# Patient Record
Sex: Male | Born: 1985 | Race: White | Hispanic: No | Marital: Married | Smoking: Current every day smoker
Health system: Southern US, Community
[De-identification: ages and names within clinical notes are randomized; demographics above are authoritative.]

## PROBLEM LIST (undated history)

## (undated) DIAGNOSIS — G5601 Carpal tunnel syndrome, right upper limb: Secondary | ICD-10-CM

## (undated) DIAGNOSIS — I10 Essential (primary) hypertension: Secondary | ICD-10-CM

## (undated) HISTORY — PX: HAND SURGERY: SHX662

## (undated) HISTORY — DX: Essential (primary) hypertension: I10

---
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
## 2007-12-02 ENCOUNTER — Emergency Department (HOSPITAL_COMMUNITY): Admission: EM | Admit: 2007-12-02 | Discharge: 2007-12-02 | Payer: Self-pay | Admitting: Emergency Medicine

## 2007-12-10 ENCOUNTER — Emergency Department (HOSPITAL_COMMUNITY): Admission: EM | Admit: 2007-12-10 | Discharge: 2007-12-10 | Payer: Self-pay | Admitting: Neurosurgery

## 2011-01-31 LAB — RPR: RPR Ser Ql: NONREACTIVE

## 2011-01-31 LAB — HSV 2 ANTIBODY, IGG: HSV 2 Glycoprotein G Ab, IgG: 0.05

## 2011-03-21 ENCOUNTER — Emergency Department (INDEPENDENT_AMBULATORY_CARE_PROVIDER_SITE_OTHER)
Admission: EM | Admit: 2011-03-21 | Discharge: 2011-03-21 | Disposition: A | Discharge status: Home / Self Care | Payer: Self-pay | Source: Home / Self Care | Attending: Emergency Medicine | Admitting: Emergency Medicine

## 2011-03-21 ENCOUNTER — Encounter (HOSPITAL_COMMUNITY): Payer: Self-pay | Admitting: Emergency Medicine

## 2011-03-21 ENCOUNTER — Emergency Department (INDEPENDENT_AMBULATORY_CARE_PROVIDER_SITE_OTHER): Payer: Self-pay

## 2011-03-21 DIAGNOSIS — Z23 Encounter for immunization: Secondary | ICD-10-CM

## 2011-03-21 DIAGNOSIS — S60459A Superficial foreign body of unspecified finger, initial encounter: Secondary | ICD-10-CM

## 2011-03-21 DIAGNOSIS — S60454A Superficial foreign body of right ring finger, initial encounter: Secondary | ICD-10-CM

## 2011-03-21 MED ORDER — TETANUS-DIPHTH-ACELL PERTUSSIS 5-2.5-18.5 LF-MCG/0.5 IM SUSP
0.5000 mL | Freq: Once | INTRAMUSCULAR | Status: AC
  Administered 2011-03-21: 0.5 mL via INTRAMUSCULAR

## 2011-03-21 MED ORDER — IBUPROFEN 600 MG PO TABS: 600.0000 mg | ORAL_TABLET | Freq: Four times a day (QID) | ORAL | Status: AC | PRN

## 2011-03-21 MED ORDER — TETANUS-DIPHTH-ACELL PERTUSSIS 5-2.5-18.5 LF-MCG/0.5 IM SUSP
INTRAMUSCULAR | Status: AC
  Filled 2011-03-21: qty 0.5

## 2011-03-21 MED ORDER — LIDOCAINE HCL (PF) 1 % IJ SOLN: 5.0000 mL | Freq: Once | INTRAMUSCULAR | Status: DC

## 2011-03-21 NOTE — ED Provider Notes (Signed)
History     CSN: [id number] Arrival date & time: 03/21/2011 10:52 AM   First MD Initiated Contact with Patient 03/21/11 1007      Chief Complaint  Patient presents with  . Foreign Body in Skin    HPI Comments: Pt righthanded male states was finishing a floor yesterday and got a large splinter in his right ring finger. States was able to remove part of it but is concerned that there is still something in it. No parathesias, redness, fevers, swelling. Has not tried anything else for this. Tetanus unknown.   Patient is a 25 y.o. male presenting with hand pain.  Hand Pain This is a new problem. The current episode started yesterday. The problem has not changed since onset.The symptoms are aggravated by nothing. The symptoms are relieved by nothing. Treatments tried: partially removed splinter.    History reviewed. No pertinent past medical history.  History reviewed. No pertinent past surgical history.  History reviewed. No pertinent family history.  History  Substance Use Topics  . Smoking status: Current Everyday Smoker -- 1.0 packs/day  . Smokeless tobacco: Not on file  . Alcohol Use: No      Review of Systems  Constitutional: Negative for fever.  Gastrointestinal: Negative for nausea and vomiting.  Skin: Positive for wound. Negative for rash.  Neurological: Negative for numbness.  Hematological: Does not bruise/bleed easily.    Allergies  Review of patient's allergies indicates no known allergies.  Home Medications   Current Outpatient Rx  Name Route Sig Dispense Refill  . IBUPROFEN 600 MG PO TABS Oral Take 1 tablet (600 mg total) by mouth every 6 (six) hours as needed for pain. 30 tablet 0    BP 149/95  Pulse 70  Temp 98.6 F (37 C)  Resp 16  SpO2 100%  Physical Exam  Nursing note and vitals reviewed. Constitutional: He is oriented to person, place, and time. He appears well-developed and well-nourished.  HENT:  Head: Normocephalic and atraumatic.    Eyes: Conjunctivae and EOM are normal.  Neck: Normal range of motion.  Cardiovascular: Regular rhythm.   Pulmonary/Chest: Effort normal. No respiratory distress.  Abdominal: He exhibits no distension.  Musculoskeletal: Normal range of motion. He exhibits no edema and no tenderness.       Baseline Strength and Sensation to R hand with normal light touch intact for Pt, distal motor and sensation in median/radial/ulnar nerve distribution with CR< 2 secs and pulse intact.  Hand with intact motor strength 5/5 flexion / extension / FDP / FDS  against resistance and 2-point discrimination intact at 5mm in right ring finger. Large FB noted at lateral distal phalanx.Wrist WNL.    Neurological: He is alert and oriented to person, place, and time.  Skin: Skin is warm and dry.  Psychiatric: He has a normal mood and affect. His behavior is normal.    ED Course  FOREIGN BODY REMOVAL Date/Time: 03/21/2011 12:38 PM Performed by: Luiz Blare Authorized by: Luiz Blare Consent: Verbal consent obtained. Written consent not obtained. Risks and benefits: risks, benefits and alternatives were discussed Consent given by: patient Patient understanding: patient states understanding of the procedure being performed Patient consent: the patient's understanding of the procedure matches consent given Procedure consent: procedure consent matches procedure scheduled Relevant documents: relevant documents present and verified Test results: test results available and properly labeled Site marked: the operative site was marked Imaging studies: imaging studies available Patient identity confirmed: verbally with patient and arm band Intake:  right ring finger. Anesthesia: digital block Local anesthetic: lidocaine 2% without epinephrine Anesthetic total: 4 ml Patient sedated: no Patient restrained: no Patient cooperative: yes Complexity: simple 1 objects recovered. Objects recovered: large wooden  splinter Post-procedure assessment: foreign body removed Patient tolerance: Patient tolerated the procedure well with no immediate complications. Comments: Pt scrubbed hand with chlorhexadine scrub for several minutes prior to procedure. Site then cleansed with iodine. No residual FB seen. Irrigated applied bacitracin and sterile pressure dressing.     Labs Reviewed - No data to display Dg Finger Ring Right  03/21/2011  *RADIOLOGY REPORT*  Clinical Data: Possible wooden foreign body in the distal right ring finger.  RIGHT RING FINGER 2+V 03/21/2011:  Comparison: None.  Findings: Region of interest marked with metallic BB on the skin. No opaque foreign body in the soft tissues.  No osseous abnormality.  IMPRESSION: No opaque foreign bodies in the soft tissues.  No osseous abnormality.  (Please note that wood is not typically radio- opaque.)  Original Report Authenticated By: Arnell Sieving, M.D.     1. Superficial foreign body of right ring finger     MDM    Luiz Blare, MD 03/21/11 1240

## 2011-03-24 ENCOUNTER — Encounter (HOSPITAL_COMMUNITY): Payer: Self-pay

## 2011-03-24 NOTE — ED Notes (Signed)
Splinter in finger, attempted self care w/o success

## 2011-07-13 ENCOUNTER — Emergency Department (HOSPITAL_COMMUNITY)
Admission: EM | Admit: 2011-07-13 | Discharge: 2011-07-13 | Disposition: A | Discharge status: Home / Self Care | Payer: Self-pay | Attending: Emergency Medicine | Admitting: Emergency Medicine

## 2011-07-13 ENCOUNTER — Encounter (HOSPITAL_COMMUNITY): Payer: Self-pay | Admitting: Emergency Medicine

## 2011-07-13 DIAGNOSIS — L0231 Cutaneous abscess of buttock: Secondary | ICD-10-CM | POA: Insufficient documentation

## 2011-07-13 DIAGNOSIS — F17200 Nicotine dependence, unspecified, uncomplicated: Secondary | ICD-10-CM | POA: Insufficient documentation

## 2011-07-13 DIAGNOSIS — K612 Anorectal abscess: Secondary | ICD-10-CM

## 2011-07-13 MED ORDER — CLINDAMYCIN HCL 150 MG PO CAPS: 300.0000 mg | ORAL_CAPSULE | Freq: Three times a day (TID) | ORAL | Status: AC

## 2011-07-13 NOTE — Discharge Instructions (Signed)
Peri-Rectal Abscess Your caregiver has diagnosed you as having a peri-rectal abscess. This is an infected area near the rectum that is filled with pus. If the abscess is near the surface of the skin, your caregiver may open (incise) the area and drain the pus. HOME CARE INSTRUCTIONS   If your abscess was opened up and drained. A small piece of gauze may be placed in the opening so that it can drain. Do not remove the gauze unless directed by your caregiver.   A loose dressing may be placed over the abscess site. Change the dressing as often as necessary to keep it clean and dry.   After the drain is removed, the area may be washed with a gentle antiseptic (soap) four times per day.   A warm sitz bath, warm packs or heating pad may be used for pain relief, taking care not to burn yourself.   Return for a wound check in 1 day or as directed.   An "inflatable doughnut" may be used for sitting with added comfort. These can be purchased at a drugstore or medical supply house.   To reduce pain and straining with bowel movements, eat a high fiber diet with plenty of fruits and vegetables. Use stool softeners as recommended by your caregiver. This is especially important if narcotic type pain medications were prescribed as these may cause marked constipation.   Only take over-the-counter or prescription medicines for pain, discomfort, or fever as directed by your caregiver.  SEEK IMMEDIATE MEDICAL CARE IF:   You have increasing pain that is not controlled by medication.   There is increased inflammation (redness), swelling, bleeding, or drainage from the area.   An oral temperature above 102 F (38.9 C) develops.   You develop chills or generalized malaise (feel lethargic or feel "washed out").   You develop any new symptoms (problems) you feel may be related to your present problem.  Document Released: 04/18/2000 Document Revised: 04/10/2011 Document Reviewed: 04/18/2008 [employer] Patient  Information 2012 [employer]. 

## 2011-07-13 NOTE — ED Provider Notes (Signed)
History     CSN: [id number]  Arrival date & time 07/13/11  1505   First MD Initiated Contact with Patient 07/13/11 1651      Chief Complaint  Patient presents with  . Abscess    (Consider location/radiation/quality/duration/timing/severity/associated sxs/prior treatment) HPI Patient presents with discomfort about a gluteal fold lesion.  He notes that the lesion has been there for approximately one year.  The size of the lesion has waxed and waned, as has the discomfort. He denies previous physician evaluation or any attempts at incision.  He denies any new fevers, chills, diarrhea, abdominal pain, other focal complaints.  He notes that he presented today because he is tired of the lesion.   History reviewed. No pertinent past medical history.  History reviewed. No pertinent past surgical history.  No family history on file.  History  Substance Use Topics  . Smoking status: Current Everyday Smoker -- 1.0 packs/day  . Smokeless tobacco: Not on file  . Alcohol Use: Yes      Review of Systems  All other systems reviewed and are negative.    Allergies  Review of patient's allergies indicates no known allergies.  Home Medications   Current Outpatient Rx  Name Route Sig Dispense Refill  . CLINDAMYCIN HCL 150 MG PO CAPS Oral Take 2 capsules (300 mg total) by mouth 3 (three) times daily. 21 capsule 0    BP 148/92  Pulse 90  Temp(Src) 97.7 F (36.5 C) (Oral)  Resp 20  SpO2 100%  Physical Exam  Nursing note and vitals reviewed. Constitutional: He is oriented to person, place, and time. He appears well-developed. No distress.  HENT:  Head: Normocephalic and atraumatic.  Eyes: Conjunctivae and EOM are normal.  Cardiovascular: Normal rate and regular rhythm.   Pulmonary/Chest: Effort normal. No stridor. No respiratory distress.  Abdominal: He exhibits no distension.  Genitourinary:     Musculoskeletal: He exhibits no edema.  Neurological: He is alert and oriented  to person, place, and time.  Skin: Skin is warm and dry.  Psychiatric: He has a normal mood and affect.    ED Course  Procedures (including critical care time)  Labs Reviewed - No data to display No results found.   1. Abscess of anal and rectal regions       MDM  This generally well-appearing male now presents with a lesion that has been present for one year.  On exam the patient is in no distress.  There is a lesion in the perirectal region which is consistent with abscess.  The patient defers my offer for incision and drainage today.  He prefers an attempt at oral antibiotics with warm compresses, sitz baths.  He was provided referral to one of our surgical colleagues if he is not improving in the next days.        Gerhard Munch, MD 07/13/11 1726

## 2011-07-13 NOTE — ED Notes (Signed)
C/o abscess on R buttocks x 1 year.  States it goes away and comes back.

## 2015-05-06 HISTORY — PX: MANDIBLE SURGERY: SHX707

## 2015-08-11 ENCOUNTER — Emergency Department (HOSPITAL_COMMUNITY)
Admission: EM | Admit: 2015-08-11 | Discharge: 2015-08-11 | Disposition: A | Discharge status: Other Acute Inpatient Hospital | Payer: Self-pay | Attending: Emergency Medicine | Admitting: Emergency Medicine

## 2015-08-11 ENCOUNTER — Emergency Department (HOSPITAL_COMMUNITY): Payer: Self-pay

## 2015-08-11 ENCOUNTER — Encounter (HOSPITAL_COMMUNITY): Payer: Self-pay | Admitting: Emergency Medicine

## 2015-08-11 DIAGNOSIS — Y9389 Activity, other specified: Secondary | ICD-10-CM | POA: Insufficient documentation

## 2015-08-11 DIAGNOSIS — S02609A Fracture of mandible, unspecified, initial encounter for closed fracture: Secondary | ICD-10-CM

## 2015-08-11 DIAGNOSIS — K002 Abnormalities of size and form of teeth: Secondary | ICD-10-CM | POA: Insufficient documentation

## 2015-08-11 DIAGNOSIS — Y998 Other external cause status: Secondary | ICD-10-CM | POA: Insufficient documentation

## 2015-08-11 DIAGNOSIS — S02642A Fracture of ramus of left mandible, initial encounter for closed fracture: Secondary | ICD-10-CM | POA: Insufficient documentation

## 2015-08-11 DIAGNOSIS — Y9289 Other specified places as the place of occurrence of the external cause: Secondary | ICD-10-CM | POA: Insufficient documentation

## 2015-08-11 DIAGNOSIS — F17200 Nicotine dependence, unspecified, uncomplicated: Secondary | ICD-10-CM | POA: Insufficient documentation

## 2015-08-11 DIAGNOSIS — R000 Tachycardia, unspecified: Secondary | ICD-10-CM | POA: Insufficient documentation

## 2015-08-11 LAB — BASIC METABOLIC PANEL
Anion gap: 14 (ref 5–15)
CALCIUM: 9 mg/dL (ref 8.9–10.3)
CHLORIDE: 106 mmol/L (ref 101–111)
CO2: 23 mmol/L (ref 22–32)
CREATININE: 0.98 mg/dL (ref 0.61–1.24)
Glucose, Bld: 92 mg/dL (ref 65–99)
Potassium: 4.2 mmol/L (ref 3.5–5.1)
SODIUM: 143 mmol/L (ref 135–145)

## 2015-08-11 LAB — CBC WITH DIFFERENTIAL/PLATELET
BASOS PCT: 0 %
Basophils Absolute: 0 10*3/uL (ref 0.0–0.1)
EOS ABS: 0.1 10*3/uL (ref 0.0–0.7)
EOS PCT: 1 %
HCT: 49.2 % (ref 39.0–52.0)
HEMOGLOBIN: 17.1 g/dL — AB (ref 13.0–17.0)
Lymphocytes Relative: 27 %
Lymphs Abs: 2.6 10*3/uL (ref 0.7–4.0)
MCH: 32.3 pg (ref 26.0–34.0)
MCHC: 34.8 g/dL (ref 30.0–36.0)
MCV: 92.8 fL (ref 78.0–100.0)
MONOS PCT: 9 %
Monocytes Absolute: 0.9 10*3/uL (ref 0.1–1.0)
NEUTROS PCT: 63 %
Neutro Abs: 6.3 10*3/uL (ref 1.7–7.7)
PLATELETS: 250 10*3/uL (ref 150–400)
RBC: 5.3 MIL/uL (ref 4.22–5.81)
RDW: 13.4 % (ref 11.5–15.5)
WBC: 9.9 10*3/uL (ref 4.0–10.5)

## 2015-08-11 LAB — ETHANOL: ALCOHOL ETHYL (B): 254 mg/dL — AB (ref <5)

## 2015-08-11 MED ORDER — NICOTINE 21 MG/24HR TD PT24
21.0000 mg | MEDICATED_PATCH | Freq: Once | TRANSDERMAL | Status: DC
  Administered 2015-08-11: 21 mg via TRANSDERMAL
  Filled 2015-08-11: qty 1

## 2015-08-11 MED ORDER — SODIUM CHLORIDE 0.9 % IV BOLUS (SEPSIS)
1000.0000 mL | Freq: Once | INTRAVENOUS | Status: AC
  Administered 2015-08-11: 1000 mL via INTRAVENOUS

## 2015-08-11 MED ORDER — MORPHINE SULFATE (PF) 4 MG/ML IV SOLN
4.0000 mg | Freq: Once | INTRAVENOUS | Status: AC
  Administered 2015-08-11: 4 mg via INTRAVENOUS
  Filled 2015-08-11: qty 1

## 2015-08-11 MED ORDER — CEFAZOLIN SODIUM 1-5 GM-% IV SOLN
1.0000 g | Freq: Once | INTRAVENOUS | Status: AC
  Administered 2015-08-11: 1 g via INTRAVENOUS
  Filled 2015-08-11: qty 50

## 2015-08-11 MED ORDER — HYDROMORPHONE HCL 1 MG/ML IJ SOLN
1.0000 mg | Freq: Once | INTRAMUSCULAR | Status: AC
  Administered 2015-08-11: 1 mg via INTRAVENOUS
  Filled 2015-08-11: qty 1
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
## 2016-07-02 ENCOUNTER — Emergency Department (HOSPITAL_COMMUNITY)
Admission: EM | Admit: 2016-07-02 | Discharge: 2016-07-02 | Disposition: A | Discharge status: Home / Self Care | Payer: Self-pay | Attending: Emergency Medicine | Admitting: Emergency Medicine

## 2016-07-02 ENCOUNTER — Encounter (HOSPITAL_COMMUNITY): Payer: Self-pay | Admitting: Emergency Medicine

## 2016-07-02 DIAGNOSIS — F17200 Nicotine dependence, unspecified, uncomplicated: Secondary | ICD-10-CM | POA: Insufficient documentation

## 2016-07-02 DIAGNOSIS — L0501 Pilonidal cyst with abscess: Secondary | ICD-10-CM | POA: Insufficient documentation

## 2016-07-02 NOTE — ED Provider Notes (Signed)
[facility] Provider Note   CSN: [id number] Arrival date & time: 07/02/16  1550  By signing my name below, I, Teofilo PodMatthew P. Jamison, attest that this documentation has been prepared under the direction and in the presence of Mathews RobinsonsJessica Shenay Torti, [place]PA-C. Electronically Signed: Teofilo PodMatthew P. Jamison, ED Scribe. 07/02/2016. 5:00 PM.    History   Chief Complaint Chief Complaint  Patient presents with  . Abscess   The history is provided by the patient. No language interpreter was used.   HPI Comments:  Warren Ross is a 31 y.o. male who presents to the Emergency Department complaining of a worsening abscess to his lower back x 2 weeks. Pt reports drainage that began 1 week ago, and the drainage has remained persistent since. Pt had been trying to drain the abscess manually but the drainage is still continuing. He states that the abscess is not painful at this time, but states that he has difficulty finding a comfortable sitting position. Pt states that he is otherwise healthy. Pt states that he had a previously prescribed Z-Pak at home that he took yesterday with no relief. Pt denies fever, chills.   History reviewed. No pertinent past medical history.  There are no active problems to display for this patient.   Past Surgical History:  Procedure Laterality Date  . HAND SURGERY Left        Home Medications    Prior to Admission medications   Not on File    Family History History reviewed. No pertinent family history.  Social History Social History  Substance Use Topics  . Smoking status: Current Every Day Smoker    Packs/day: 1.00  . Smokeless tobacco: Not on file  . Alcohol use 58.8 oz/week    98 Cans of beer per week     Comment: "close to 24 beers a day"     Allergies   Patient has no known allergies.   Review of Systems Review of Systems  Constitutional: Negative for chills and fever.  HENT: Negative for ear pain and sore throat.   Eyes: Negative for  pain and visual disturbance.  Respiratory: Negative for cough and shortness of breath.   Cardiovascular: Negative for chest pain and palpitations.  Gastrointestinal: Negative for abdominal pain and vomiting.  Genitourinary: Negative for dysuria and hematuria.  Musculoskeletal: Negative for arthralgias and back pain.  Skin: Positive for wound. Negative for color change and rash.  Neurological: Negative for seizures and syncope.  All other systems reviewed and are negative.    Physical Exam Updated Vital Signs BP 140/97 (BP Location: Left Arm)   Pulse 98   Temp 97.9 F (36.6 C) (Oral)   Resp 19   Ht 6' (1.829 m)   Wt 79.4 kg   SpO2 100%   BMI 23.73 kg/m   Physical Exam  Constitutional: He appears well-developed and well-nourished. No distress.  Patient is afebrile, non-toxic appearing, seating comfortably in chair in no acute distress.   HENT:  Head: Normocephalic and atraumatic.  Eyes: Conjunctivae are normal.  Cardiovascular: Normal rate, regular rhythm and normal heart sounds.   Pulmonary/Chest: Effort normal and breath sounds normal. No respiratory distress. He has no wheezes. He has no rales. He exhibits no tenderness.  Abdominal: He exhibits no distension.  Neurological: He is alert.  Skin: Skin is warm and dry. He is not diaphoretic. No pallor.  Erythematous raised area 3.5 cm long by 1cm wide at the sacral region. No spreading or erythema or evidence of cellulitis.  Non-tender to the touch.   Psychiatric: He has a normal mood and affect.  Nursing note and vitals reviewed.    ED Treatments / Results  DIAGNOSTIC STUDIES:  Oxygen Saturation is 100% on RA, normal by my interpretation.    COORDINATION OF CARE:  4:57 PM Will order [place] and perform I&D. Discussed treatment plan with pt at bedside and pt agreed to plan.   Labs (all labs ordered are listed, but only abnormal results are displayed) Labs Reviewed - No data to display  EKG  EKG Interpretation None        Radiology No results found.  Procedures Procedures (including critical care time)  EMERGENCY DEPARTMENT US SOFT TISSUE INTERPRETATION "Study: Limited Soft Tissue Ultrasound"  INDICATIONS: Soft tissue infection Multiple views of the body part were obtained in real-time with a multi-frequency linear probe  PERFORMED BY: Myself IMAGES ARCHIVED?: No SIDE:Midline BODY PART:Lower back INTERPRETATION:  No abcess noted     Medications Ordered in ED Medications - No data to display   Initial Impression / Assessment and Plan / ED Course   I have reviewed the triage vital signs and the nursing notes.  Pertinent labs & imaging results that were available during my care of the patient were reviewed by me and considered in my medical decision making (see chart for details).       Patient presents with pilonidal abscess that is not amenable to I&D. [place] without evidence of drainable abscess. No ulcers Non-tender to the touch. Exam otherwise unremarkable.  Discharge home with close follow up with surgery.Patient was advised to use sitz baths.  Patient was discussed with Dr. Jeraldine Loots who also has seen patient and agrees with assessment and plan.  Discussed strict return precautions. Patient was advised to return to the emergency department if experiencing any new or worsening symptoms. Patient clearly understood instructions and agreed with discharge plan. He  Final Clinical Impressions(s) / ED Diagnoses   Final diagnoses:  Pilonidal abscess    New Prescriptions There are no discharge medications for this patient. I personally performed the services described in this documentation, which was scribed in my presence. The recorded information has been reviewed and is accurate.     Georgiana Shore, PA-C 07/02/16 1841    Gerhard Munch, MD 07/02/16 2040

## 2016-07-02 NOTE — ED Triage Notes (Signed)
Pt with abscess to lower back with some draining x 2 weeks

## 2016-08-01 ENCOUNTER — Ambulatory Visit (HOSPITAL_COMMUNITY)
Admission: EM | Admit: 2016-08-01 | Discharge: 2016-08-01 | Disposition: A | Discharge status: Home / Self Care | Payer: Self-pay | Attending: Internal Medicine | Admitting: Internal Medicine

## 2016-08-01 ENCOUNTER — Encounter (HOSPITAL_COMMUNITY): Payer: Self-pay | Admitting: Emergency Medicine

## 2016-08-01 DIAGNOSIS — Z720 Tobacco use: Secondary | ICD-10-CM

## 2016-08-01 DIAGNOSIS — I10 Essential (primary) hypertension: Secondary | ICD-10-CM

## 2016-08-01 MED ORDER — AMLODIPINE BESYLATE 10 MG PO TABS: 10.0000 mg | ORAL_TABLET | Freq: Every day | ORAL | 2 refills | Status: DC

## 2016-08-01 NOTE — ED Provider Notes (Signed)
[facility]    CSN: [id number] Arrival date & time: 08/01/16  1005     History   Chief Complaint Chief Complaint  Patient presents with  . Hypertension    HPI Warren Ross is a 31 y.o. male.   Pt presents to clinic due to BP 170/105 on pre-work physical.  Smokes 1.5 pk/day.  Denies chest pain, SOB, N/V or diaphoresis.        History reviewed. No pertinent past medical history.  There are no active problems to display for this patient.   Past Surgical History:  Procedure Laterality Date  . HAND SURGERY Left        Home Medications    Prior to Admission medications   Medication Sig Start Date End Date Taking? Authorizing Provider  amLODipine (NORVASC) 10 MG tablet Take 1 tablet (10 mg total) by mouth daily. 08/01/16   Arnaldo Natal, MD    Family History History reviewed. No pertinent family history.  Social History Social History  Substance Use Topics  . Smoking status: Current Every Day Smoker    Packs/day: 1.00  . Smokeless tobacco: Not on file  . Alcohol use 58.8 oz/week    98 Cans of beer per week     Comment: "close to 24 beers a day"     Allergies   Patient has no known allergies.   Review of Systems Review of Systems  Constitutional: Negative for chills and fever.  HENT: Negative for sore throat and tinnitus.   Eyes: Negative for redness.  Respiratory: Negative for cough and shortness of breath.   Cardiovascular: Negative for chest pain and palpitations.  Gastrointestinal: Negative for abdominal pain, diarrhea, nausea and vomiting.  Genitourinary: Negative for dysuria, frequency and urgency.  Musculoskeletal: Negative for myalgias.  Skin: Negative for rash.       No lesions  Neurological: Negative for weakness.  Hematological: Does not bruise/bleed easily.  Psychiatric/Behavioral: Negative for suicidal ideas.     Physical Exam Triage Vital Signs ED Triage Vitals  Enc Vitals Group     BP 08/01/16 1029 (!)  157/111     Pulse Rate 08/01/16 1029 88     Resp 08/01/16 1029 16     Temp 08/01/16 1029 98 F (36.7 C)     Temp Source 08/01/16 1029 Oral     SpO2 08/01/16 1029 100 %     Weight --      Height --      Head Circumference --      Peak Flow --      Pain Score 08/01/16 1030 0     Pain Loc --      Pain Edu? --      Excl. in GC? --    No data found.   Updated Vital Signs BP (!) 157/111 (BP Location: Left Arm) Comment: reported elevated BP to [facility]  Pulse 88   Temp 98 F (36.7 C) (Oral)   Resp 16   SpO2 100%   Visual Acuity Right Eye Distance:   Left Eye Distance:   Bilateral Distance:    Right Eye Near:   Left Eye Near:    Bilateral Near:     Physical Exam  Constitutional: He is oriented to person, place, and time. He appears well-developed and well-nourished. No distress.  HENT:  Head: Normocephalic and atraumatic.  Mouth/Throat: Oropharynx is clear and moist.  Eyes: Conjunctivae and EOM are normal. Pupils are equal, round, and reactive to light.  No scleral icterus.  Neck: Normal range of motion. Neck supple. No JVD present. No tracheal deviation present. No thyromegaly present.  Cardiovascular: Normal rate, regular rhythm and normal heart sounds.  Exam reveals no gallop and no friction rub.   No murmur heard. Pulmonary/Chest: Effort normal and breath sounds normal. No respiratory distress.  Abdominal: Soft. Bowel sounds are normal. He exhibits no distension. There is no tenderness.  Musculoskeletal: Normal range of motion. He exhibits no edema.  Lymphadenopathy:    He has no cervical adenopathy.  Neurological: He is alert and oriented to person, place, and time. No cranial nerve deficit.  Skin: Skin is warm and dry. No rash noted. No erythema.  Psychiatric: He has a normal mood and affect. His behavior is normal. Judgment and thought content normal.     UC Treatments / Results  Labs (all labs ordered are listed, but only abnormal results are  displayed) Labs Reviewed - No data to display  EKG  EKG Interpretation None       Radiology No results found.  Procedures Procedures (including critical care time)  Medications Ordered in UC Medications - No data to display   Initial Impression / Assessment and Plan / UC Course  I have reviewed the triage vital signs and the nursing notes.  Pertinent labs & imaging results that were available during my care of the patient were reviewed by me and considered in my medical decision making (see chart for details).     BP readings x2 confirm hypertension.  Stage II HTN would usually require 2 agents.  Due to unsure access to follow up at this location pt and I have agreed to trial of amlodipine due to low risk side effect profile. Encourage to quit smoking and to establish care with PMD (which will be possible once pt gets health insurance thru job he is currently applying for).  Final Clinical Impressions(s) / UC Diagnoses   Final diagnoses:  Essential hypertension  Tobacco abuse    New Prescriptions Discharge Medication List as of 08/01/2016 11:46 AM    START taking these medications   Details  amLODipine (NORVASC) 10 MG tablet Take 1 tablet (10 mg total) by mouth daily., Starting Fri 08/01/2016, Normal         Arnaldo Natal, MD 08/01/16 [phone]

## 2016-08-01 NOTE — ED Triage Notes (Signed)
The patient was sent to the [facility] for hypertension from a job interview.

## 2017-01-16 ENCOUNTER — Ambulatory Visit (INDEPENDENT_AMBULATORY_CARE_PROVIDER_SITE_OTHER): Payer: BLUE CROSS/BLUE SHIELD | Admitting: Family Medicine

## 2017-01-16 ENCOUNTER — Encounter: Payer: Self-pay | Admitting: Family Medicine

## 2017-01-16 VITALS — BP 140/100 | HR 98 | Temp 98.1°F | Resp 14 | Ht 72.5 in | Wt 184.8 lb

## 2017-01-16 DIAGNOSIS — Z720 Tobacco use: Secondary | ICD-10-CM

## 2017-01-16 DIAGNOSIS — I10 Essential (primary) hypertension: Secondary | ICD-10-CM

## 2017-01-16 DIAGNOSIS — F1090 Alcohol use, unspecified, uncomplicated: Secondary | ICD-10-CM | POA: Insufficient documentation

## 2017-01-16 DIAGNOSIS — F1020 Alcohol dependence, uncomplicated: Secondary | ICD-10-CM

## 2017-01-16 HISTORY — DX: Essential (primary) hypertension: I10

## 2017-01-16 HISTORY — DX: Alcohol use, unspecified, uncomplicated: F10.90

## 2017-01-16 HISTORY — DX: Tobacco use: Z72.0

## 2017-01-16 MED ORDER — AMLODIPINE BESYLATE 10 MG PO TABS: 10.0000 mg | ORAL_TABLET | Freq: Every day | ORAL | 1 refills | Status: DC

## 2017-01-16 NOTE — Patient Instructions (Signed)
F/U 6 weeks for blood pressure - fasting at that visit
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
## 2017-01-16 NOTE — Assessment & Plan Note (Signed)
dISCUSSED tobacco and alcohol use. He is not ready to quit either. We discussed cutting back at least. States he's tried nicotine patches but it didn't work. Discussed SHEENT exam Wellbutrin but he is not one he used either one. We'll continue to work with him about his tobacco and alcohol.

## 2017-01-16 NOTE — Assessment & Plan Note (Signed)
Restart norvasc  , check CMET, CBC\ Return for BP check and fasting lipid

## 2017-01-17 LAB — COMPREHENSIVE METABOLIC PANEL
AG Ratio: 2 (calc) (ref 1.0–2.5)
ALBUMIN MSPROF: 4.4 g/dL (ref 3.6–5.1)
ALT: 15 U/L (ref 9–46)
AST: 18 U/L (ref 10–40)
Alkaline phosphatase (APISO): 74 U/L (ref 40–115)
BUN/Creatinine Ratio: 5 (calc) — ABNORMAL LOW (ref 6–22)
BUN: 5 mg/dL — AB (ref 7–25)
CO2: 28 mmol/L (ref 20–32)
CREATININE: 1.03 mg/dL (ref 0.60–1.35)
Calcium: 9.3 mg/dL (ref 8.6–10.3)
Chloride: 104 mmol/L (ref 98–110)
Globulin: 2.2 g/dL (calc) (ref 1.9–3.7)
Glucose, Bld: 74 mg/dL (ref 65–99)
POTASSIUM: 4.3 mmol/L (ref 3.5–5.3)
SODIUM: 138 mmol/L (ref 135–146)
TOTAL PROTEIN: 6.6 g/dL (ref 6.1–8.1)
Total Bilirubin: 0.5 mg/dL (ref 0.2–1.2)

## 2017-01-17 LAB — CBC WITH DIFFERENTIAL/PLATELET
BASOS ABS: 60 {cells}/uL (ref 0–200)
Basophils Relative: 1 %
EOS ABS: 180 {cells}/uL (ref 15–500)
Eosinophils Relative: 3 %
HCT: 48.5 % (ref 38.5–50.0)
Hemoglobin: 16.8 g/dL (ref 13.2–17.1)
Lymphs Abs: 2166 cells/uL (ref 850–3900)
MCH: 32.1 pg (ref 27.0–33.0)
MCHC: 34.6 g/dL (ref 32.0–36.0)
MCV: 92.6 fL (ref 80.0–100.0)
MONOS PCT: 12 %
MPV: 10.3 fL (ref 7.5–12.5)
NEUTROS PCT: 47.9 %
Neutro Abs: 2874 cells/uL (ref 1500–7800)
Platelets: 262 10*3/uL (ref 140–400)
RBC: 5.24 10*6/uL (ref 4.20–5.80)
RDW: 12.2 % (ref 11.0–15.0)
Total Lymphocyte: 36.1 %
WBC mixed population: 720 cells/uL (ref 200–950)
WBC: 6 10*3/uL (ref 3.8–10.8)

## 2017-01-20 ENCOUNTER — Encounter: Payer: Self-pay | Admitting: *Deleted

## 2017-03-02 ENCOUNTER — Encounter: Payer: Self-pay | Admitting: Family Medicine

## 2017-03-02 ENCOUNTER — Ambulatory Visit (INDEPENDENT_AMBULATORY_CARE_PROVIDER_SITE_OTHER): Payer: BLUE CROSS/BLUE SHIELD | Admitting: Family Medicine

## 2017-03-02 VITALS — BP 132/94 | HR 84 | Temp 98.1°F | Resp 16 | Ht 72.5 in | Wt 187.0 lb

## 2017-03-02 DIAGNOSIS — I10 Essential (primary) hypertension: Secondary | ICD-10-CM

## 2017-03-02 DIAGNOSIS — Z13220 Encounter for screening for lipoid disorders: Secondary | ICD-10-CM | POA: Diagnosis not present

## 2017-03-02 LAB — BASIC METABOLIC PANEL
BUN: 8 mg/dL (ref 7–25)
CHLORIDE: 104 mmol/L (ref 98–110)
CO2: 26 mmol/L (ref 20–32)
CREATININE: 0.91 mg/dL (ref 0.60–1.35)
Calcium: 9.9 mg/dL (ref 8.6–10.3)
GLUCOSE: 88 mg/dL (ref 65–99)
Potassium: 5.2 mmol/L (ref 3.5–5.3)
SODIUM: 138 mmol/L (ref 135–146)

## 2017-03-02 LAB — LIPID PANEL
CHOL/HDL RATIO: 2.6 (calc) (ref <5.0)
Cholesterol: 189 mg/dL (ref <200)
HDL: 72 mg/dL (ref >40)
LDL Cholesterol (Calc): 102 mg/dL (calc) — ABNORMAL HIGH
NON-HDL CHOLESTEROL (CALC): 117 mg/dL (ref <130)
TRIGLYCERIDES: 66 mg/dL (ref <150)

## 2017-03-02 MED ORDER — HYDROCHLOROTHIAZIDE 12.5 MG PO TABS: 12.5000 mg | ORAL_TABLET | Freq: Every day | ORAL | 2 refills | Status: DC

## 2017-03-02 MED ORDER — AMLODIPINE BESYLATE 10 MG PO TABS: 10.0000 mg | ORAL_TABLET | Freq: Every day | ORAL | 1 refills | Status: DC

## 2017-03-02 NOTE — Patient Instructions (Addendum)
F/U 3  months  New medication HCTZ take with the amlodipine  We will call with results

## 2017-03-02 NOTE — Assessment & Plan Note (Signed)
Blood pressure improved but still uncontrolled.  Will add hydrochlorothiazide 12.5 mg in addition to his amlodipine 10 mg.  His cholesterol level will be checked today.

## 2017-03-02 NOTE — Progress Notes (Signed)
   Subjective:    Patient ID: Warren Ross, male    DOB: 1985/05/26, 31 y.o.   MRN: [id number]  Patient presents for Follow-up (is fasting)  Pt here to f/u  He was started on norvasc 10 mg for blood pressure in Sept, he had been on before after BP was elevated at work screening.  Due for fasting lipid and BP recheck today  No side effects from the amlodipine states that he feels well.  Review Of Systems:  GEN- denies fatigue, fever, weight loss,weakness, recent illness HEENT- denies eye drainage, change in vision, nasal discharge, CVS- denies chest pain, palpitations RESP- denies SOB, cough, wheeze ABD- denies N/V, change in stools, abd pain GU- denies dysuria, hematuria, dribbling, incontinence MSK- denies joint pain, muscle aches, injury Neuro- denies headache, dizziness, syncope, seizure activity       Objective:    BP (!) 132/94   Pulse 84   Temp 98.1 F (36.7 C) (Oral)   Resp 16   Ht 6' 0.5" (1.842 m)   Wt 187 lb (84.8 kg)   SpO2 98%   BMI 25.01 kg/m  GEN- NAD, alert and oriented x3 HEENT- PERRL, EOMI, non injected sclera, pink conjunctiva, MMM, oropharynx clear CVS- RRR, no murmur RESP-CTAB EXT- No edema Pulses- Radial,2+        Assessment & Plan:      Problem List Items Addressed This Visit      Unprioritized   Screening cholesterol level   Relevant Orders   Lipid panel   Essential hypertension - Primary    Blood pressure improved but still uncontrolled.  Will add hydrochlorothiazide 12.5 mg in addition to his amlodipine 10 mg.  His cholesterol level will be checked today.      Relevant Medications   hydrochlorothiazide (HYDRODIURIL) 12.5 MG tablet   amLODipine (NORVASC) 10 MG tablet   Other Relevant Orders   Basic metabolic panel      Note: This dictation was prepared with Dragon dictation along with smaller phrase technology. Any transcriptional errors that result from this process are unintentional.

## 2017-03-05 ENCOUNTER — Encounter: Payer: Self-pay | Admitting: *Deleted

## 2017-06-02 ENCOUNTER — Ambulatory Visit (INDEPENDENT_AMBULATORY_CARE_PROVIDER_SITE_OTHER): Payer: BLUE CROSS/BLUE SHIELD | Admitting: Family Medicine

## 2017-06-02 ENCOUNTER — Encounter: Payer: Self-pay | Admitting: Family Medicine

## 2017-06-02 ENCOUNTER — Other Ambulatory Visit: Payer: Self-pay

## 2017-06-02 VITALS — BP 126/78 | HR 82 | Temp 98.1°F | Resp 16 | Ht 72.5 in | Wt 189.0 lb

## 2017-06-02 DIAGNOSIS — Z720 Tobacco use: Secondary | ICD-10-CM

## 2017-06-02 DIAGNOSIS — I10 Essential (primary) hypertension: Secondary | ICD-10-CM

## 2017-06-02 MED ORDER — HYDROCHLOROTHIAZIDE 12.5 MG PO TABS: 12.5000 mg | ORAL_TABLET | Freq: Every day | ORAL | 2 refills | Status: DC

## 2017-06-02 MED ORDER — AMLODIPINE BESYLATE 10 MG PO TABS: 10.0000 mg | ORAL_TABLET | Freq: Every day | ORAL | 2 refills | Status: DC

## 2017-06-02 NOTE — Progress Notes (Signed)
   Subjective:    Patient ID: Warren Ross, male    DOB: 1985-09-29, 32 y.o.   MRN: [id number]  Patient presents for Follow-up (is fasting)  Pt here to f/u chronic medical problems. HTN- last visit in Oct added HCTZ 12.5mg  to his norvasc 10mg   Normal cholesterol and renal function in October     No concerns today  He has stopped drinking    Review Of Systems:  GEN- denies fatigue, fever, weight loss,weakness, recent illness HEENT- denies eye drainage, change in vision, nasal discharge, CVS- denies chest pain, palpitations RESP- denies SOB, cough, wheeze ABD- denies N/V, change in stools, abd pain GU- denies dysuria, hematuria, dribbling, incontinence MSK- denies joint pain, muscle aches, injury Neuro- denies headache, dizziness, syncope, seizure activity       Objective:    BP 126/78   Pulse 82   Temp 98.1 F (36.7 C) (Oral)   Resp 16   Ht 6' 0.5" (1.842 m)   Wt 189 lb (85.7 kg)   SpO2 98%   BMI 25.28 kg/m  GEN- NAD, alert and oriented x3 HEENT- PERRL, EOMI, non injected sclera, pink conjunctiva, MMM, oropharynx clear Neck- Supple, no thyromegaly CVS- RRR, no murmur RESP-CTAB ABD-NABS,soft,NT,ND EXT- No edema Pulses- Radial 2+        Assessment & Plan:      Problem List Items Addressed This Visit      Unprioritized   Tobacco use    He is not ready to quit yet, since he stopped drinking      Essential hypertension - Primary    Well controlled no change in medication Recent Labs look good F/U 6 months for Physical      Relevant Medications   amLODipine (NORVASC) 10 MG tablet   hydrochlorothiazide (HYDRODIURIL) 12.5 MG tablet      Note: This dictation was prepared with Dragon dictation along with smaller phrase technology. Any transcriptional errors that result from this process are unintentional.

## 2017-06-02 NOTE — Assessment & Plan Note (Signed)
He is not ready to quit yet, since he stopped drinking

## 2017-06-02 NOTE — Patient Instructions (Signed)
F/U 6 months for PHYSICAL  

## 2017-06-02 NOTE — Assessment & Plan Note (Signed)
Well controlled no change in medication Recent Labs look good F/U 6 months for Physical

## 2017-07-14 ENCOUNTER — Encounter: Payer: Self-pay | Admitting: Family Medicine

## 2017-07-14 ENCOUNTER — Ambulatory Visit (INDEPENDENT_AMBULATORY_CARE_PROVIDER_SITE_OTHER): Payer: BLUE CROSS/BLUE SHIELD | Admitting: Family Medicine

## 2017-07-14 ENCOUNTER — Other Ambulatory Visit: Payer: Self-pay

## 2017-07-14 VITALS — BP 132/70 | HR 82 | Temp 98.9°F | Resp 16 | Ht 72.5 in | Wt 190.0 lb

## 2017-07-14 DIAGNOSIS — J0100 Acute maxillary sinusitis, unspecified: Secondary | ICD-10-CM

## 2017-07-14 DIAGNOSIS — B349 Viral infection, unspecified: Secondary | ICD-10-CM

## 2017-07-14 DIAGNOSIS — R6889 Other general symptoms and signs: Secondary | ICD-10-CM | POA: Diagnosis not present

## 2017-07-14 LAB — INFLUENZA A AND B AG, IMMUNOASSAY
INFLUENZA A ANTIGEN: NOT DETECTED
INFLUENZA B ANTIGEN: NOT DETECTED

## 2017-07-14 MED ORDER — GUAIFENESIN-CODEINE 100-10 MG/5ML PO SOLN: 5.0000 mL | Freq: Four times a day (QID) | ORAL | 0 refills | Status: DC | PRN

## 2017-07-14 MED ORDER — AMOXICILLIN 875 MG PO TABS
875.0000 mg | ORAL_TABLET | Freq: Two times a day (BID) | ORAL | 0 refills | Status: DC
Start: 2017-07-14 — End: 2018-05-25

## 2017-07-14 NOTE — Progress Notes (Signed)
   Subjective:    Patient ID: Warren Ross, male    DOB: 15-Feb-1986, 32 y.o.   MRN: [id number]  Patient presents for Illness (x4 days- fever, body aches, nasal congestion, productive cough with thick white mucus, sinus pressure, tooth pain)   Started last Thursday, cough with congestion and production, nasal drainage, sinus pressure, right sided facial , tooth pain Took OTC cough medicine    Review Of Systems:  GEN- denies fatigue, fever, weight loss,weakness, recent illness HEENT- denies eye drainage, change in vision, nasal discharge, CVS- denies chest pain, palpitations RESP- denies SOB, cough, wheeze ABD- denies N/V, change in stools, abd pain GU- denies dysuria, hematuria, dribbling, incontinence MSK- denies joint pain, muscle aches, injury Neuro- denies headache, dizziness, syncope, seizure activity       Objective:    BP 132/70   Pulse 82   Temp 98.9 F (37.2 C) (Oral)   Resp 16   Ht 6' 0.5" (1.842 m)   Wt 190 lb (86.2 kg)   SpO2 96%   BMI 25.41 kg/m   GEN- NAD, alert and oriented x3 HEENT- PERRL, EOMI, non injected sclera, pink conjunctiva, MMM, oropharynx mild injection, TM clear bilat no effusion,  + Right maxillary sinus tenderness, inflammed turbinates,  Nasal drainage  Neck- Supple, shotty submandibular LAD CVS- RRR, no murmur RESP-CTAB EXT- No edema Pulses- Radial 2+    Flu neg      Assessment & Plan:      Problem List Items Addressed This Visit    None    Visit Diagnoses    Viral illness    -  Primary   Relevant Orders   Influenza A and B Ag, Immunoassay (Completed)   Acute maxillary sinusitis, recurrence not specified       also add anti-histamine   Relevant Medications   amoxicillin (AMOXIL) 875 MG tablet   guaiFENesin-codeine 100-10 MG/5ML syrup      Note: This dictation was prepared with Dragon dictation along with smaller phrase technology. Any transcriptional errors that result from this process are unintentional.

## 2017-07-14 NOTE — Patient Instructions (Signed)
F/U as previous  Take antibiotics  Use claritin/zyrtec  Take cough medicine

## 2017-07-15 ENCOUNTER — Encounter: Payer: Self-pay | Admitting: Family Medicine
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
## 2017-12-01 ENCOUNTER — Encounter: Payer: BLUE CROSS/BLUE SHIELD | Admitting: Family Medicine

## 2018-05-25 ENCOUNTER — Other Ambulatory Visit: Payer: Self-pay

## 2018-05-25 ENCOUNTER — Encounter: Payer: Self-pay | Admitting: Family Medicine

## 2018-05-25 ENCOUNTER — Ambulatory Visit (INDEPENDENT_AMBULATORY_CARE_PROVIDER_SITE_OTHER): Payer: 59 | Admitting: Family Medicine

## 2018-05-25 VITALS — BP 130/68 | HR 98 | Temp 98.4°F | Resp 14 | Ht 77.0 in | Wt 201.0 lb

## 2018-05-25 DIAGNOSIS — Z720 Tobacco use: Secondary | ICD-10-CM | POA: Diagnosis not present

## 2018-05-25 DIAGNOSIS — I10 Essential (primary) hypertension: Secondary | ICD-10-CM | POA: Diagnosis not present

## 2018-05-25 DIAGNOSIS — M7022 Olecranon bursitis, left elbow: Secondary | ICD-10-CM

## 2018-05-25 DIAGNOSIS — Z0000 Encounter for general adult medical examination without abnormal findings: Secondary | ICD-10-CM | POA: Diagnosis not present

## 2018-05-25 LAB — COMPREHENSIVE METABOLIC PANEL
AG RATIO: 1.7 (calc) (ref 1.0–2.5)
ALT: 20 U/L (ref 9–46)
AST: 16 U/L (ref 10–40)
Albumin: 4.5 g/dL (ref 3.6–5.1)
Alkaline phosphatase (APISO): 81 U/L (ref 40–115)
BUN: 9 mg/dL (ref 7–25)
CO2: 28 mmol/L (ref 20–32)
CREATININE: 1.01 mg/dL (ref 0.60–1.35)
Calcium: 9.8 mg/dL (ref 8.6–10.3)
Chloride: 100 mmol/L (ref 98–110)
GLUCOSE: 101 mg/dL — AB (ref 65–99)
Globulin: 2.6 g/dL (calc) (ref 1.9–3.7)
Potassium: 3.9 mmol/L (ref 3.5–5.3)
SODIUM: 137 mmol/L (ref 135–146)
TOTAL PROTEIN: 7.1 g/dL (ref 6.1–8.1)
Total Bilirubin: 0.6 mg/dL (ref 0.2–1.2)

## 2018-05-25 LAB — CBC WITH DIFFERENTIAL/PLATELET
Absolute Monocytes: 704 cells/uL (ref 200–950)
BASOS ABS: 74 {cells}/uL (ref 0–200)
Basophils Relative: 1.1 %
Eosinophils Absolute: 121 cells/uL (ref 15–500)
Eosinophils Relative: 1.8 %
HCT: 47.9 % (ref 38.5–50.0)
HEMOGLOBIN: 16.4 g/dL (ref 13.2–17.1)
Lymphs Abs: 2251 cells/uL (ref 850–3900)
MCH: 30.1 pg (ref 27.0–33.0)
MCHC: 34.2 g/dL (ref 32.0–36.0)
MCV: 88.1 fL (ref 80.0–100.0)
MONOS PCT: 10.5 %
MPV: 10.4 fL (ref 7.5–12.5)
NEUTROS ABS: 3551 {cells}/uL (ref 1500–7800)
Neutrophils Relative %: 53 %
Platelets: 317 10*3/uL (ref 140–400)
RBC: 5.44 10*6/uL (ref 4.20–5.80)
RDW: 12.5 % (ref 11.0–15.0)
Total Lymphocyte: 33.6 %
WBC: 6.7 10*3/uL (ref 3.8–10.8)

## 2018-05-25 LAB — LIPID PANEL
Cholesterol: 178 mg/dL (ref <200)
HDL: 45 mg/dL (ref >40)
LDL CHOLESTEROL (CALC): 117 mg/dL — AB
NON-HDL CHOLESTEROL (CALC): 133 mg/dL — AB (ref <130)
TRIGLYCERIDES: 67 mg/dL (ref <150)
Total CHOL/HDL Ratio: 4 (calc) (ref <5.0)

## 2018-05-25 MED ORDER — HYDROCHLOROTHIAZIDE 12.5 MG PO TABS: 12.5000 mg | ORAL_TABLET | Freq: Every day | ORAL | 2 refills | Status: DC

## 2018-05-25 MED ORDER — AMLODIPINE BESYLATE 10 MG PO TABS: 10.0000 mg | ORAL_TABLET | Freq: Every day | ORAL | 2 refills | Status: DC

## 2018-05-25 NOTE — Patient Instructions (Signed)
F/U 6 months

## 2018-05-25 NOTE — Assessment & Plan Note (Addendum)
Continues to smoke 1.5ppd Discussed cutting back , trying to get to 1ppd first He will think about it, but not interested in quitting at this time

## 2018-05-25 NOTE — Assessment & Plan Note (Signed)
Well controlled, he has picked up some weight since he is no longer drinking but appears healthier He is also staying active

## 2018-05-25 NOTE — Progress Notes (Signed)
   Subjective:    Patient ID: Warren Ross, male    DOB: [birth year], 33 y.o.   MRN: [id number]  Patient presents for Annual Exam (is fasting)   Here for complete physical exam.  Patient is reviewed he is taking his hydrochlorothiazide and amlodipine for his blood pressure. Family history reviewed  TDAP UTD Due for Flu shot - declines    He has he fluid knot on left elbow present for months, but is going down, it doesn't hurt.  Smokes 1.5ppd   Review Of Systems:  GEN- denies fatigue, fever, weight loss,weakness, recent illness HEENT- denies eye drainage, change in vision, nasal discharge, CVS- denies chest pain, palpitations RESP- denies SOB, cough, wheeze ABD- denies N/V, change in stools, abd pain GU- denies dysuria, hematuria, dribbling, incontinence MSK- denies joint pain, muscle aches, injury Neuro- denies headache, dizziness, syncope, seizure activity       Objective:    BP 130/68   Pulse 98   Temp 98.4 F (36.9 C) (Oral)   Resp 14   Ht 6\' 5"  (1.956 m)   Wt 201 lb (91.2 kg)   SpO2 100%   BMI 23.84 kg/m  GEN- NAD, alert and oriented x3 HEENT- PERRL, EOMI, non injected sclera, pink conjunctiva, MMM, oropharynx clear, TM clear bilat, no effusion , nares clear  Neck- Supple, no thyromegaly CVS- RRR, no murmur RESP-CTAB ABD-NABS,soft,NT,ND Psych- normal affect and mood  EXT- No edema, left elbow olecranon swelling,NT, no induration, no erythema Pulses- Radial, DP- 2+   Fall/depression/audit C screening NEGATIVE     Assessment & Plan:      Problem List Items Addressed This Visit      Unprioritized   Essential hypertension    Well controlled, he has picked up some weight since he is no longer drinking but appears healthier He is also staying active      Relevant Orders   CBC with Differential/Platelet   Comprehensive metabolic panel   Lipid panel   Tobacco use    Continues to smoke 1.5ppd Discussed cutting back , trying to get to 1ppd  first He will think about it, but not interested in quitting at this time       Other Visit Diagnoses    Routine general medical examination at a health care facility    -  Primary   CPE done, history updated, declines flu shot,he will set up dental visit   Relevant Orders   CBC with Differential/Platelet   Comprehensive metabolic panel   Lipid panel   Olecranon bursitis of left elbow       it is going down on its own, his previous job he was climbing under houses, no pain or redness, we discussing draining he wants to hold off      Note: This dictation was prepared with Dragon dictation along with smaller phrase technology. Any transcriptional errors that result from this process are unintentional.

## 2018-05-27 ENCOUNTER — Encounter: Payer: Self-pay | Admitting: *Deleted

## 2018-11-23 ENCOUNTER — Ambulatory Visit: Payer: 59 | Admitting: Family Medicine

## 2018-12-02 ENCOUNTER — Other Ambulatory Visit: Payer: Self-pay

## 2018-12-03 ENCOUNTER — Ambulatory Visit (INDEPENDENT_AMBULATORY_CARE_PROVIDER_SITE_OTHER): Payer: 59 | Admitting: Family Medicine

## 2018-12-03 ENCOUNTER — Encounter: Payer: Self-pay | Admitting: Family Medicine

## 2018-12-03 VITALS — BP 136/84 | HR 90 | Temp 97.9°F | Resp 14 | Ht 77.0 in | Wt 196.0 lb

## 2018-12-03 DIAGNOSIS — E785 Hyperlipidemia, unspecified: Secondary | ICD-10-CM | POA: Insufficient documentation

## 2018-12-03 DIAGNOSIS — G5601 Carpal tunnel syndrome, right upper limb: Secondary | ICD-10-CM

## 2018-12-03 DIAGNOSIS — F411 Generalized anxiety disorder: Secondary | ICD-10-CM | POA: Diagnosis not present

## 2018-12-03 DIAGNOSIS — Z720 Tobacco use: Secondary | ICD-10-CM

## 2018-12-03 DIAGNOSIS — I10 Essential (primary) hypertension: Secondary | ICD-10-CM

## 2018-12-03 DIAGNOSIS — E782 Mixed hyperlipidemia: Secondary | ICD-10-CM

## 2018-12-03 HISTORY — DX: Hyperlipidemia, unspecified: E78.5

## 2018-12-03 MED ORDER — HYDROCHLOROTHIAZIDE 12.5 MG PO TABS: 12.5000 mg | ORAL_TABLET | Freq: Every day | ORAL | 2 refills | Status: DC

## 2018-12-03 MED ORDER — BUPROPION HCL ER (SR) 150 MG PO TB12: 150.0000 mg | ORAL_TABLET | Freq: Two times a day (BID) | ORAL | 3 refills | Status: DC

## 2018-12-03 MED ORDER — AMLODIPINE BESYLATE 10 MG PO TABS: 10.0000 mg | ORAL_TABLET | Freq: Every day | ORAL | 2 refills | Status: DC

## 2018-12-03 NOTE — Progress Notes (Signed)
Subjective:    Patient ID: Warren Ross, male    DOB: [birth year], 33 y.o.   MRN: [id number]  Patient presents for Follow-up (is fasting)  Right hand has been "going to sleep: Usually from his forearm down to his fingertips.  This is been going on for quite some time.  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  Often feels like he needs to shake them out.  It will happen when he is driving also if he sleeps on his hands.  He has not had any injury to his neck no neck pain.  Hypertension he is taking his blood pressure medicine without any difficulty no side effects from the medication.  At increased anxiety has had panic attacks.  States that he has been very stressed his wife has decided to leave.  They have a young child between them.  She also has a new boyfriend this is all happened in the past 5 months.  At times he does not sleep very well but he just feels on edge.  He is also been smoking more now up to 3 packs a day some days.    Review Of Systems:  GEN- denies fatigue, fever, weight loss,weakness, recent illness HEENT- denies eye drainage, change in vision, nasal discharge, CVS- denies chest pain, palpitations RESP- denies SOB, cough, wheeze ABD- denies N/V, change in stools, abd pain GU- denies dysuria, hematuria, dribbling, incontinence MSK- denies joint pain, muscle aches, injury Neuro- denies headache, dizziness, syncope, seizure activity       Objective:    BP 136/84   Pulse 90   Temp 97.9 F (36.6 C) (Oral)   Resp 14   Ht 6\' 5"  (1.956 m)   Wt 196 lb (88.9 kg)   SpO2 96%   BMI 23.24 kg/m  GEN- NAD, alert and oriented x3 HEENT- PERRL, EOMI, non injected sclera, pink conjunctiva, MMM, oropharynx clear Neck- Supple, no thyromegaly CVS- RRR, no murmur RESP-CTAB Psych- normal affect and mood NEURO-CNII-XII in tact, no focal deficits, normal monofilament, neg tinels and phalens EXT- No edema Pulses- Radial 2+  PHQ 9 score 3, ETOH none      Assessment & Plan:      Problem List Items Addressed This Visit      Unprioritized   Essential hypertension - Primary    Pressures well controlled.  We will check his labs today for metabolic panel and CBC.      Relevant Medications   amLODipine (NORVASC) 10 MG tablet   hydrochlorothiazide (HYDRODIURIL) 12.5 MG tablet   Other Relevant Orders   CBC with Differential/Platelet   Comprehensive metabolic panel   GAD (generalized anxiety disorder)   Relevant Medications   buPROPion (WELLBUTRIN SR) 150 MG 12 hr tablet   Hyperlipidemia   Relevant Medications   amLODipine (NORVASC) 10 MG tablet   hydrochlorothiazide (HYDRODIURIL) 12.5 MG tablet   Other Relevant Orders   Lipid panel   Tobacco use    Counseled on tobacco cessation and the effects.  He has had increased stress in the setting of his wife separating from him and now having to figure out what they have been to do with her daughter.  He has been smoking more because of this.  We will start him on Wellbutrin to hopefully help both the stressors as well as his tobacco habit.  We will follow-up in 1 month for his medications.       Other Visit Diagnoses    Carpal  tunnel syndrome of right wrist       Nerve conduction study to be done he has classic symptoms of carpal tunnel   Relevant Medications   buPROPion (WELLBUTRIN SR) 150 MG 12 hr tablet   Other Relevant Orders   Nerve conduction test   Ambulatory referral to Neurology      Note: This dictation was prepared with Dragon dictation along with smaller phrase technology. Any transcriptional errors that result from this process are unintentional.

## 2018-12-03 NOTE — Assessment & Plan Note (Signed)
Counseled on tobacco cessation and the effects.  He has had increased stress in the setting of his wife separating from him and now having to figure out what they have been to do with her daughter.  He has been smoking more because of this.  We will start him on Wellbutrin to hopefully help both the stressors as well as his tobacco habit.  We will follow-up in 1 month for his medications.

## 2018-12-03 NOTE — Assessment & Plan Note (Signed)
Pressures well controlled.  We will check his labs today for metabolic panel and CBC.

## 2018-12-03 NOTE — Patient Instructions (Signed)
Referral for nerve conduction study  We will call with lab results F/U 1 months for medications

## 2018-12-04 LAB — COMPREHENSIVE METABOLIC PANEL
AG Ratio: 2 (calc) (ref 1.0–2.5)
ALT: 25 U/L (ref 9–46)
AST: 27 U/L (ref 10–40)
Albumin: 4.6 g/dL (ref 3.6–5.1)
Alkaline phosphatase (APISO): 79 U/L (ref 36–130)
BUN/Creatinine Ratio: 7 (calc) (ref 6–22)
BUN: 6 mg/dL — ABNORMAL LOW (ref 7–25)
CO2: 27 mmol/L (ref 20–32)
Calcium: 9.4 mg/dL (ref 8.6–10.3)
Chloride: 101 mmol/L (ref 98–110)
Creat: 0.89 mg/dL (ref 0.60–1.35)
Globulin: 2.3 g/dL (calc) (ref 1.9–3.7)
Glucose, Bld: 84 mg/dL (ref 65–99)
Potassium: 3.8 mmol/L (ref 3.5–5.3)
Sodium: 139 mmol/L (ref 135–146)
Total Bilirubin: 0.4 mg/dL (ref 0.2–1.2)
Total Protein: 6.9 g/dL (ref 6.1–8.1)

## 2018-12-04 LAB — LIPID PANEL
Cholesterol: 155 mg/dL (ref <200)
HDL: 62 mg/dL (ref >=40)
LDL Cholesterol (Calc): 74 mg/dL (calc)
Non-HDL Cholesterol (Calc): 93 mg/dL (calc) (ref <130)
Total CHOL/HDL Ratio: 2.5 (calc) (ref <5.0)
Triglycerides: 107 mg/dL (ref <150)

## 2018-12-04 LAB — CBC WITH DIFFERENTIAL/PLATELET
Absolute Monocytes: 567 cells/uL (ref 200–950)
Basophils Absolute: 61 cells/uL (ref 0–200)
Basophils Relative: 1 %
Eosinophils Absolute: 220 cells/uL (ref 15–500)
Eosinophils Relative: 3.6 %
HCT: 50.6 % — ABNORMAL HIGH (ref 38.5–50.0)
Hemoglobin: 17.9 g/dL — ABNORMAL HIGH (ref 13.2–17.1)
Lymphs Abs: 2489 cells/uL (ref 850–3900)
MCH: 32.3 pg (ref 27.0–33.0)
MCHC: 35.4 g/dL (ref 32.0–36.0)
MCV: 91.2 fL (ref 80.0–100.0)
MPV: 10 fL (ref 7.5–12.5)
Monocytes Relative: 9.3 %
Neutro Abs: 2763 cells/uL (ref 1500–7800)
Neutrophils Relative %: 45.3 %
Platelets: 283 10*3/uL (ref 140–400)
RBC: 5.55 10*6/uL (ref 4.20–5.80)
RDW: 13 % (ref 11.0–15.0)
Total Lymphocyte: 40.8 %
WBC: 6.1 10*3/uL (ref 3.8–10.8)

## 2018-12-25 ENCOUNTER — Other Ambulatory Visit: Payer: Self-pay | Admitting: Family Medicine

## 2019-01-04 ENCOUNTER — Other Ambulatory Visit: Payer: Self-pay

## 2019-01-04 ENCOUNTER — Encounter: Payer: Self-pay | Admitting: Family Medicine

## 2019-01-04 ENCOUNTER — Ambulatory Visit (INDEPENDENT_AMBULATORY_CARE_PROVIDER_SITE_OTHER): Payer: 59 | Admitting: Family Medicine

## 2019-01-04 VITALS — BP 128/88 | HR 88 | Temp 97.9°F | Resp 16 | Wt 191.6 lb

## 2019-01-04 DIAGNOSIS — S76011A Strain of muscle, fascia and tendon of right hip, initial encounter: Secondary | ICD-10-CM | POA: Diagnosis not present

## 2019-01-04 DIAGNOSIS — F411 Generalized anxiety disorder: Secondary | ICD-10-CM

## 2019-01-04 DIAGNOSIS — Z720 Tobacco use: Secondary | ICD-10-CM | POA: Diagnosis not present

## 2019-01-04 NOTE — Assessment & Plan Note (Signed)
Declines further meds at this time Wants to work on tobacco on his own Will call if he changes his mind   For hip, can use NSAIDS aleve or ibuprofen BID see if this improves, more strain

## 2019-01-04 NOTE — Patient Instructions (Signed)
Take aleve twice a day with food  Continue blood pressure medications F/U End of Jan for Physical

## 2019-01-04 NOTE — Progress Notes (Signed)
   Subjective:    Patient ID: Warren Ross, male    DOB: 1985/05/09, 33 y.o.   MRN: [id number]  Patient presents for Follow-up (anxiety, medications)  Patient here to follow-up medications for his stress and tobacco use.  Her last visit he mentioned that he was very stressed out and very anxious as he is separated from his wife and they have young children between them.  He been smoking more.  We started him on Wellbutrin 150mg .   Today he stays stopped the wellbutrin made him feel funny.   Now down to 1.5ppd a day    Has not had any panic attacks since the last visit   Decided    Sleeping good, states he is working long hours      Drinking more fluids , often doesn't eat as much as he works outside, weight down 5lbs which is common for him during the summer    He is also scheduled to see the neurologist for his carpal tunnel syndrome at the end of the month.   Noticed he was limping a little when he came in, states his cousin was horse playing and jumped on his back and side. Feels like he pulled something, no change in bowel or bladder, pain on side of leg when he gets it Still able to work  Review Of Systems:  GEN- denies fatigue, fever, weight loss,weakness, recent illness HEENT- denies eye drainage, change in vision, nasal discharge, CVS- denies chest pain, palpitations RESP- denies SOB, cough, wheeze ABD- denies N/V, change in stools, abd pain GU- denies dysuria, hematuria, dribbling, incontinence MSK- +joint pain, muscle aches, injury Neuro- denies headache, dizziness, syncope, seizure activity       Objective:    BP 128/88   Pulse 88   Temp 97.9 F (36.6 C)   Resp 16   Wt 191 lb 9.6 oz (86.9 kg)   BMI 22.72 kg/m  GEN- NAD, alert and oriented x3 CVS- RRR, no murmur RESP-CTAB Psych- normal affect and mood  MSK - Spine NT, good ROM spine, neg SLR, pain with IR of right hip, no pain with ER, strength equal bilat LE, antalgic gait  Neuro-normal tone LE  GAD  7score 2 PHQ 9 score 0      Assessment & Plan:      Problem List Items Addressed This Visit      Unprioritized   GAD (generalized anxiety disorder) - Primary    Declines further meds at this time Wants to work on tobacco on his own Will call if he changes his mind   For hip, can use NSAIDS aleve or ibuprofen BID see if this improves, more strain      Tobacco use    Other Visit Diagnoses    Strain of right hip, initial encounter          Note: This dictation was prepared with Dragon dictation along with smaller phrase technology. Any transcriptional errors that result from this process are unintentional.

## 2019-01-26 ENCOUNTER — Ambulatory Visit (INDEPENDENT_AMBULATORY_CARE_PROVIDER_SITE_OTHER): Payer: 59 | Admitting: Neurology

## 2019-01-26 ENCOUNTER — Other Ambulatory Visit: Payer: Self-pay

## 2019-01-26 ENCOUNTER — Encounter: Payer: Self-pay | Admitting: Neurology

## 2019-01-26 DIAGNOSIS — G5602 Carpal tunnel syndrome, left upper limb: Secondary | ICD-10-CM

## 2019-01-26 DIAGNOSIS — G5601 Carpal tunnel syndrome, right upper limb: Secondary | ICD-10-CM | POA: Diagnosis not present

## 2019-01-26 HISTORY — DX: Carpal tunnel syndrome, right upper limb: G56.01

## 2019-01-26 HISTORY — DX: Carpal tunnel syndrome, left upper limb: G56.02

## 2019-01-26 NOTE — Progress Notes (Signed)
Please refer to EMG and nerve conduction procedure note.  

## 2019-01-26 NOTE — Procedures (Signed)
     HISTORY:  Warren Ross is a 33 year old gentleman with a history of bilateral hand numbness that has been present for a number of months, it is worse on the right hand than the left.  He denies any significant neck pain or pain down the arms on either side.  He is being evaluated for possible neuropathy or a cervical radiculopathy.  NERVE CONDUCTION STUDIES:  Nerve conduction studies were performed on both upper extremities.  The distal motor latency of the right median nerve was prolonged with a normal motor amplitude.  The distal motor latency and motor amplitude for the left median nerve was normal.  The distal motor latencies and motor amplitudes for the ulnar nerves were normal bilaterally with normal nerve conduction velocities for the median and ulnar nerves bilaterally.  The sensory latency for the right median nerve was prolonged, normal for the left median nerve and for the ulnar nerves bilaterally.  The F-wave latencies for the ulnar nerves were normal bilaterally.  EMG STUDIES:  EMG study was performed on the left upper extremity:  The first dorsal interosseous muscle reveals 2 to 4 K units with full recruitment. No fibrillations or positive waves were noted. The abductor pollicis brevis muscle reveals 2 to 4 K units with full recruitment. No fibrillations or positive waves were noted. The extensor indicis proprius muscle reveals 1 to 3 K units with full recruitment. No fibrillations or positive waves were noted. The pronator teres muscle reveals 2 to 3 K units with full recruitment. No fibrillations or positive waves were noted. The biceps muscle reveals 1 to 2 K units with full recruitment. No fibrillations or positive waves were noted. The triceps muscle reveals 2 to 4 K units with full recruitment. No fibrillations or positive waves were noted. The anterior deltoid muscle reveals 2 to 3 K units with full recruitment. No fibrillations or positive waves were noted. The  cervical paraspinal muscles were tested at 2 levels. No abnormalities of insertional activity were seen at either level tested. There was poor relaxation.   IMPRESSION:  Nerve conduction studies done on both upper extremities shows evidence of mild right carpal tunnel syndrome.  EMG evaluation of the left upper extremity was unremarkable, no evidence of an overlying cervical radiculopathy was seen.  Jill Alexanders MD 01/26/2019 4:07 PM  [facility] [address] [place], [place] [address]  Phone [phone] Fax [phone]

## 2019-01-26 NOTE — Progress Notes (Addendum)
Call pt Nerve conduction shows mild carpal tunnel  I dont think it is severe enough for surgery but he may benefit from use of brace to wrist and/or injections  He can be referred to hand surgery for the injections if he wants Wrist brace script written    [place]    Nerve / Sites Muscle Latency Ref. Amplitude Ref. Rel Amp Segments Distance Velocity Ref. Area    ms ms mV mV %  cm m/s m/s mVms  R Median - APB     Wrist APB 4.4 ?4.4 11.7 ?4.0 100 Wrist - APB 7   45.2     Upper arm APB 9.1  11.7  99.9 Upper arm - Wrist 24 51 ?49 46.1  L Median - APB     Wrist APB 3.6 ?4.4 11.2 ?4.0 100 Wrist - APB 7   43.6     Upper arm APB 8.2  10.9  97.3 Upper arm - Wrist 24 53 ?49 41.9  R Ulnar - ADM     Wrist ADM 2.5 ?3.3 15.3 ?6.0 100 Wrist - ADM 7   48.4     B.Elbow ADM 5.9  14.8  96.7 B.Elbow - Wrist 21 62 ?49 46.1     A.Elbow ADM 7.7  14.5  97.9 A.Elbow - B.Elbow 10 55 ?49 46.9         A.Elbow - Wrist      L Ulnar - ADM     Wrist ADM 2.7 ?3.3 14.8 ?6.0 100 Wrist - ADM 7   47.4     B.Elbow ADM 6.5  14.4  97.3 B.Elbow - Wrist 22 58 ?49 48.3     A.Elbow ADM 8.2  13.9  96.4 A.Elbow - B.Elbow 10 58 ?49 45.8         A.Elbow - Wrist                 SNC    Nerve / Sites Rec. Site Peak Lat Ref.  Amp Ref. Segments Distance    ms ms V V  cm  R Median - Orthodromic (Dig II, Mid palm)     Dig II Wrist 4.4 ?3.4 5 ?10 Dig II - Wrist 13  L Median - Orthodromic (Dig II, Mid palm)     Dig II Wrist 3.3 ?3.4 11 ?10 Dig II - Wrist 13  R Ulnar - Orthodromic, (Dig V, Mid palm)     Dig V Wrist 2.7 ?3.1 12 ?5 Dig V - Wrist 11  L Ulnar - Orthodromic, (Dig V, Mid palm)     Dig V Wrist 2.7 ?3.1 16 ?5 Dig V - Wrist 36              F  Wave    Nerve F Lat Ref.   ms ms  R Ulnar - ADM 28.1 ?32.0  L Ulnar - ADM 28.8 ?32.0

## 2019-01-27 NOTE — Progress Notes (Signed)
Call placed to patient and patient made aware.   States that he will try the cock up splint prior to trying the injections. Will call back if he wants referral.

## 2019-06-01 ENCOUNTER — Encounter: Payer: 59 | Admitting: Family Medicine

## 2019-06-21 ENCOUNTER — Other Ambulatory Visit: Payer: Self-pay

## 2019-06-21 ENCOUNTER — Emergency Department (HOSPITAL_COMMUNITY): Payer: 59

## 2019-06-21 ENCOUNTER — Encounter (HOSPITAL_COMMUNITY): Payer: Self-pay

## 2019-06-21 ENCOUNTER — Inpatient Hospital Stay (HOSPITAL_COMMUNITY)
Admission: EM | Admit: 2019-06-21 | Discharge: 2019-06-23 | DRG: 872 | Disposition: A | Discharge status: Home / Self Care | Payer: 59 | Attending: Internal Medicine | Admitting: Internal Medicine

## 2019-06-21 DIAGNOSIS — F1090 Alcohol use, unspecified, uncomplicated: Secondary | ICD-10-CM

## 2019-06-21 DIAGNOSIS — L0390 Cellulitis, unspecified: Secondary | ICD-10-CM | POA: Diagnosis not present

## 2019-06-21 DIAGNOSIS — Z833 Family history of diabetes mellitus: Secondary | ICD-10-CM | POA: Diagnosis not present

## 2019-06-21 DIAGNOSIS — Z789 Other specified health status: Secondary | ICD-10-CM

## 2019-06-21 DIAGNOSIS — I10 Essential (primary) hypertension: Secondary | ICD-10-CM | POA: Diagnosis present

## 2019-06-21 DIAGNOSIS — F1011 Alcohol abuse, in remission: Secondary | ICD-10-CM | POA: Diagnosis present

## 2019-06-21 DIAGNOSIS — Z20822 Contact with and (suspected) exposure to covid-19: Secondary | ICD-10-CM | POA: Diagnosis present

## 2019-06-21 DIAGNOSIS — F17210 Nicotine dependence, cigarettes, uncomplicated: Secondary | ICD-10-CM | POA: Diagnosis present

## 2019-06-21 DIAGNOSIS — N492 Inflammatory disorders of scrotum: Secondary | ICD-10-CM | POA: Diagnosis present

## 2019-06-21 DIAGNOSIS — E871 Hypo-osmolality and hyponatremia: Secondary | ICD-10-CM | POA: Diagnosis present

## 2019-06-21 DIAGNOSIS — Z79899 Other long term (current) drug therapy: Secondary | ICD-10-CM

## 2019-06-21 DIAGNOSIS — A419 Sepsis, unspecified organism: Principal | ICD-10-CM | POA: Diagnosis present

## 2019-06-21 DIAGNOSIS — E876 Hypokalemia: Secondary | ICD-10-CM | POA: Diagnosis present

## 2019-06-21 DIAGNOSIS — L03319 Cellulitis of trunk, unspecified: Secondary | ICD-10-CM

## 2019-06-21 DIAGNOSIS — Z7289 Other problems related to lifestyle: Secondary | ICD-10-CM

## 2019-06-21 DIAGNOSIS — R000 Tachycardia, unspecified: Secondary | ICD-10-CM | POA: Diagnosis present

## 2019-06-21 DIAGNOSIS — L03311 Cellulitis of abdominal wall: Secondary | ICD-10-CM | POA: Diagnosis not present

## 2019-06-21 DIAGNOSIS — L731 Pseudofolliculitis barbae: Secondary | ICD-10-CM | POA: Diagnosis present

## 2019-06-21 DIAGNOSIS — Z825 Family history of asthma and other chronic lower respiratory diseases: Secondary | ICD-10-CM

## 2019-06-21 DIAGNOSIS — Z720 Tobacco use: Secondary | ICD-10-CM | POA: Diagnosis not present

## 2019-06-21 HISTORY — DX: Hypokalemia: E87.6

## 2019-06-21 HISTORY — DX: Other specified health status: Z78.9

## 2019-06-21 HISTORY — DX: Alcohol use, unspecified, uncomplicated: F10.90

## 2019-06-21 HISTORY — DX: Cellulitis, unspecified: L03.90

## 2019-06-21 LAB — BASIC METABOLIC PANEL
Anion gap: 9 (ref 5–15)
BUN: 6 mg/dL (ref 6–20)
CO2: 19 mmol/L — ABNORMAL LOW (ref 22–32)
Calcium: 9.1 mg/dL (ref 8.9–10.3)
Chloride: 100 mmol/L (ref 98–111)
Creatinine, Ser: 1.08 mg/dL (ref 0.61–1.24)
GFR calc Af Amer: >60 mL/min (ref >60)
GFR calc non Af Amer: >60 mL/min (ref >60)
Glucose, Bld: 108 mg/dL — ABNORMAL HIGH (ref 70–99)
Potassium: 3.4 mmol/L — ABNORMAL LOW (ref 3.5–5.1)
Sodium: 128 mmol/L — ABNORMAL LOW (ref 135–145)

## 2019-06-21 LAB — SARS CORONAVIRUS 2 BY RT PCR (DIASORIN): SARS Coronavirus 2: NEGATIVE

## 2019-06-21 LAB — CBC WITH DIFFERENTIAL/PLATELET
Abs Immature Granulocytes: 0.16 10*3/uL — ABNORMAL HIGH (ref 0.00–0.07)
Basophils Absolute: 0.1 10*3/uL (ref 0.0–0.1)
Basophils Relative: 0 %
Eosinophils Absolute: 0 10*3/uL (ref 0.0–0.5)
Eosinophils Relative: 0 %
HCT: 47.8 % (ref 39.0–52.0)
Hemoglobin: 17 g/dL (ref 13.0–17.0)
Immature Granulocytes: 1 %
Lymphocytes Relative: 10 %
Lymphs Abs: 2.2 10*3/uL (ref 0.7–4.0)
MCH: 31.9 pg (ref 26.0–34.0)
MCHC: 35.6 g/dL (ref 30.0–36.0)
MCV: 89.7 fL (ref 80.0–100.0)
Monocytes Absolute: 2.1 10*3/uL — ABNORMAL HIGH (ref 0.1–1.0)
Monocytes Relative: 10 %
Neutro Abs: 16.9 10*3/uL — ABNORMAL HIGH (ref 1.7–7.7)
Neutrophils Relative %: 79 %
Platelets: 273 10*3/uL (ref 150–400)
RBC: 5.33 MIL/uL (ref 4.22–5.81)
RDW: 12.8 % (ref 11.5–15.5)
WBC: 21.4 10*3/uL — ABNORMAL HIGH (ref 4.0–10.5)
nRBC: 0 % (ref 0.0–0.2)

## 2019-06-21 LAB — LACTIC ACID, PLASMA: Lactic Acid, Venous: 0.7 mmol/L (ref 0.5–1.9)

## 2019-06-21 LAB — CBG MONITORING, ED: Glucose-Capillary: 103 mg/dL — ABNORMAL HIGH (ref 70–99)

## 2019-06-21 MED ORDER — ALBUTEROL SULFATE (2.5 MG/3ML) 0.083% IN NEBU: 2.5000 mg | INHALATION_SOLUTION | Freq: Four times a day (QID) | RESPIRATORY_TRACT | Status: DC | PRN

## 2019-06-21 MED ORDER — ACETAMINOPHEN 650 MG RE SUPP: 650.0000 mg | Freq: Four times a day (QID) | RECTAL | Status: DC | PRN

## 2019-06-21 MED ORDER — SODIUM CHLORIDE 0.9 % IV BOLUS
1000.0000 mL | Freq: Once | INTRAVENOUS | Status: AC
  Administered 2019-06-21: 1000 mL via INTRAVENOUS

## 2019-06-21 MED ORDER — LORAZEPAM 2 MG/ML IJ SOLN: 1.0000 mg | INTRAMUSCULAR | Status: DC | PRN

## 2019-06-21 MED ORDER — VANCOMYCIN HCL 1500 MG/300ML IV SOLN
1500.0000 mg | Freq: Two times a day (BID) | INTRAVENOUS | Status: DC
  Administered 2019-06-21 – 2019-06-23 (×4): 1500 mg via INTRAVENOUS
  Filled 2019-06-21 (×5): qty 300

## 2019-06-21 MED ORDER — OXYCODONE-ACETAMINOPHEN 5-325 MG PO TABS
1.0000 | ORAL_TABLET | ORAL | Status: DC | PRN
  Administered 2019-06-21 – 2019-06-23 (×10): 1 via ORAL
  Filled 2019-06-21 (×10): qty 1

## 2019-06-21 MED ORDER — ACETAMINOPHEN 325 MG PO TABS: 650.0000 mg | ORAL_TABLET | Freq: Four times a day (QID) | ORAL | Status: DC | PRN

## 2019-06-21 MED ORDER — FOLIC ACID 1 MG PO TABS
1.0000 mg | ORAL_TABLET | Freq: Every day | ORAL | Status: DC
  Administered 2019-06-21 – 2019-06-23 (×3): 1 mg via ORAL
  Filled 2019-06-21 (×3): qty 1

## 2019-06-21 MED ORDER — THIAMINE HCL 100 MG PO TABS
100.0000 mg | ORAL_TABLET | Freq: Every day | ORAL | Status: DC
  Administered 2019-06-21 – 2019-06-23 (×3): 100 mg via ORAL
  Filled 2019-06-21 (×3): qty 1

## 2019-06-21 MED ORDER — OXYCODONE-ACETAMINOPHEN 5-325 MG PO TABS
2.0000 | ORAL_TABLET | Freq: Once | ORAL | Status: AC
  Administered 2019-06-21: 05:00:00 2 via ORAL
  Filled 2019-06-21: qty 2

## 2019-06-21 MED ORDER — AMLODIPINE BESYLATE 10 MG PO TABS
10.0000 mg | ORAL_TABLET | Freq: Every day | ORAL | Status: DC
  Administered 2019-06-21 – 2019-06-23 (×3): 10 mg via ORAL
  Filled 2019-06-21 (×3): qty 1

## 2019-06-21 MED ORDER — NICOTINE 21 MG/24HR TD PT24
21.0000 mg | MEDICATED_PATCH | Freq: Every day | TRANSDERMAL | Status: DC
  Administered 2019-06-21 – 2019-06-23 (×3): 21 mg via TRANSDERMAL
  Filled 2019-06-21 (×3): qty 1

## 2019-06-21 MED ORDER — ONDANSETRON HCL 4 MG/2ML IJ SOLN: 4.0000 mg | Freq: Four times a day (QID) | INTRAMUSCULAR | Status: DC | PRN

## 2019-06-21 MED ORDER — ONDANSETRON 4 MG PO TBDP
4.0000 mg | ORAL_TABLET | Freq: Once | ORAL | Status: AC
  Administered 2019-06-21: 4 mg via ORAL
  Filled 2019-06-21: qty 1

## 2019-06-21 MED ORDER — IOHEXOL 300 MG/ML  SOLN
100.0000 mL | Freq: Once | INTRAMUSCULAR | Status: AC | PRN
  Administered 2019-06-21: 05:00:00 100 mL via INTRAVENOUS

## 2019-06-21 MED ORDER — VANCOMYCIN HCL 1500 MG/300ML IV SOLN
1500.0000 mg | Freq: Once | INTRAVENOUS | Status: AC
  Administered 2019-06-21: 1500 mg via INTRAVENOUS
  Filled 2019-06-21: qty 300

## 2019-06-21 MED ORDER — SODIUM CHLORIDE 0.9 % IV SOLN: INTRAVENOUS | Status: DC

## 2019-06-21 MED ORDER — LORAZEPAM 1 MG PO TABS: 1.0000 mg | ORAL_TABLET | ORAL | Status: DC | PRN

## 2019-06-21 MED ORDER — ADULT MULTIVITAMIN W/MINERALS CH
1.0000 | ORAL_TABLET | Freq: Every day | ORAL | Status: DC
  Administered 2019-06-21 – 2019-06-23 (×3): 1 via ORAL
  Filled 2019-06-21 (×3): qty 1

## 2019-06-21 MED ORDER — PIPERACILLIN-TAZOBACTAM 3.375 G IVPB 30 MIN
3.3750 g | Freq: Once | INTRAVENOUS | Status: AC
  Administered 2019-06-21: 3.375 g via INTRAVENOUS
  Filled 2019-06-21: qty 50

## 2019-06-21 MED ORDER — THIAMINE HCL 100 MG/ML IJ SOLN: 100.0000 mg | Freq: Every day | INTRAMUSCULAR | Status: DC

## 2019-06-21 MED ORDER — ENOXAPARIN SODIUM 40 MG/0.4ML ~~LOC~~ SOLN
40.0000 mg | SUBCUTANEOUS | Status: DC
  Administered 2019-06-21 – 2019-06-22 (×2): 40 mg via SUBCUTANEOUS
  Filled 2019-06-21 (×2): qty 0.4

## 2019-06-21 MED ORDER — PIPERACILLIN-TAZOBACTAM 3.375 G IVPB
3.3750 g | Freq: Three times a day (TID) | INTRAVENOUS | Status: DC
  Administered 2019-06-21 – 2019-06-23 (×6): 3.375 g via INTRAVENOUS
  Filled 2019-06-21 (×6): qty 50

## 2019-06-21 MED ORDER — ONDANSETRON HCL 4 MG PO TABS: 4.0000 mg | ORAL_TABLET | Freq: Four times a day (QID) | ORAL | Status: DC | PRN

## 2019-06-21 MED ORDER — POTASSIUM CHLORIDE CRYS ER 20 MEQ PO TBCR
30.0000 meq | EXTENDED_RELEASE_TABLET | ORAL | Status: AC
  Administered 2019-06-21: 30 meq via ORAL
  Filled 2019-06-21: qty 1

## 2019-06-21 NOTE — ED Notes (Signed)
Attempted to call nursing report.  

## 2019-06-21 NOTE — H&P (Addendum)
History and Physical    Warren Ross XBM:[id number] DOB: 07-12-1985 DOA: 06/21/2019  Referring MD/NP/PA: Thayer Jew, MD PCP: Alycia Rossetti, MD  Patient coming from: Home  Chief Complaint: Scrotal pain and swelling  I have personally briefly reviewed patient's old medical records in [facility]   HPI: Warren Ross is a 34 y.o. male with medical history significant of hypertension, presents with complaints of scrotal pain and swelling.  Reports that about 4 days ago he had developed an ingrown hair on his scrotum.  Yesterday, he tried to pop it and notes no more came out then with a zit.  Over the last day the area has become red, swollen, and painful.  Patient notes having previous ingrown hairs of his buttocks couple years ago.  At home patient reports having associated symptoms of a fever up to 102 F.  Denies having any nausea, vomiting, diarrhea, chest pain, or shortness of breath admits to smoking 1 pack cigarettes per day on average and usually drinks a 12 pack of beer, but usually only on the weekends.  Denies any history of alcohol withdrawals.  ED Course: Upon admission into the emergency department patient was seen to be afebrile with heart rates into the 150s, and all other vital signs maintained.  Labs significant for WBC 21.4, sodium 128, potassium 3.4, and lactic acid 0.7. Blood cultures have been obtained Patient was started on empiric antibiotics of vancomycin and Zosyn and given oxycodone for pain.   Review of Systems  Constitutional: Positive for fever.  HENT: Negative for ear discharge and nosebleeds.   Eyes: Negative for photophobia and pain.  Respiratory: Negative for shortness of breath.   Cardiovascular: Negative for chest pain and leg swelling.  Gastrointestinal: Negative for nausea and vomiting.  Genitourinary: Negative for frequency.       Positive for scrotal pain and swelling  Musculoskeletal: Negative for falls.  Skin: Negative for  itching and rash.       Positive for skin color change  Neurological: Negative for focal weakness and loss of consciousness.  Psychiatric/Behavioral: Positive for substance abuse.    Past Medical History:  Diagnosis Date  . Hypertension   . Right carpal tunnel syndrome 01/26/2019    Past Surgical History:  Procedure Laterality Date  . HAND SURGERY Left   . MANDIBLE SURGERY Left 2017     reports that he has been smoking. He has been smoking about 1.00 pack per day. He has quit using smokeless tobacco. He reports current alcohol use of about 98.0 standard drinks of alcohol per week. He reports current drug use. Drug: Marijuana.  No Known Allergies  Family History  Problem Relation Age of Onset  . COPD Mother   . Early death Father   . Diabetes Maternal Grandfather     Prior to Admission medications   Medication Sig Start Date End Date Taking? Authorizing Provider  amLODipine (NORVASC) 10 MG tablet Take 1 tablet (10 mg total) by mouth daily. 12/03/18  Yes [place], Modena Nunnery, MD  hydrochlorothiazide (HYDRODIURIL) 12.5 MG tablet Take 1 tablet (12.5 mg total) by mouth daily. 12/03/18  Yes [place], Modena Nunnery, MD    Physical Exam:  Constitutional: Male currently in NAD, calm, comfortable Vitals:   06/21/19 0650 06/21/19 0700 06/21/19 0715 06/21/19 0730  BP: 111/75 (!) 120/91 121/78 112/74  Pulse: (!) 116 (!) 118 (!) 109 (!) 121  Resp: 20     Temp:      TempSrc:  SpO2: 98% 99% 100% 100%  Weight:      Height:       Eyes: PERRL, lids and conjunctivae normal ENMT: Mucous membranes are dry. Posterior pharynx clear of any exudate or lesions. Normal dentition.  Neck: normal, supple, no masses, no thyromegaly Respiratory: clear to auscultation bilaterally, no wheezing, no crackles. Normal respiratory effort. No accessory muscle use.  Cardiovascular: Regular rate and rhythm, no murmurs / rubs / gallops. No extremity edema. 2+ pedal pulses. No carotid bruits.  Abdomen: no  tenderness, no masses palpated. No hepatosplenomegaly. Bowel sounds positive.  Musculoskeletal: no clubbing / cyanosis. No joint deformity upper and lower extremities. Good ROM, no contractures. Normal muscle tone.  Skin: no rashes, lesions, ulcers. No induration Neurologic: CN 2-12 grossly intact. Sensation intact, DTR normal. Strength 5/5 in all 4.  Psychiatric: Normal judgment and insight. Alert and oriented x 3. Normal mood.     Labs on Admission: I have personally reviewed following labs and imaging studies  CBC: Recent Labs  Lab 06/21/19 0447  WBC 21.4*  NEUTROABS 16.9*  HGB 17.0  HCT 47.8  MCV 89.7  PLT 273   Basic Metabolic Panel: Recent Labs  Lab 06/21/19 0447  NA 128*  K 3.4*  CL 100  CO2 19*  GLUCOSE 108*  BUN 6  CREATININE 1.08  CALCIUM 9.1   GFR: Estimated Creatinine Clearance: 106.9 mL/min (by C-G formula based on SCr of 1.08 mg/dL). Liver Function Tests: No results for input(s): AST, ALT, ALKPHOS, BILITOT, PROT, ALBUMIN in the last 168 hours. No results for input(s): LIPASE, AMYLASE in the last 168 hours. No results for input(s): AMMONIA in the last 168 hours. Coagulation Profile: No results for input(s): INR, PROTIME in the last 168 hours. Cardiac Enzymes: No results for input(s): CKTOTAL, CKMB, CKMBINDEX, TROPONINI in the last 168 hours. BNP (last 3 results) No results for input(s): PROBNP in the last 8760 hours. HbA1C: No results for input(s): HGBA1C in the last 72 hours. CBG: Recent Labs  Lab 06/21/19 0559  GLUCAP 103*   Lipid Profile: No results for input(s): CHOL, HDL, LDLCALC, TRIG, CHOLHDL, LDLDIRECT in the last 72 hours. Thyroid Function Tests: No results for input(s): TSH, T4TOTAL, FREET4, T3FREE, THYROIDAB in the last 72 hours. Anemia Panel: No results for input(s): VITAMINB12, FOLATE, FERRITIN, TIBC, IRON, RETICCTPCT in the last 72 hours. Urine analysis: No results found for: COLORURINE, APPEARANCEUR, LABSPEC, PHURINE, GLUCOSEU,  HGBUR, BILIRUBINUR, KETONESUR, PROTEINUR, UROBILINOGEN, NITRITE, LEUKOCYTESUR Sepsis Labs: No results found for this or any previous visit (from the past 240 hour(s)).   Radiological Exams on Admission: CT PELVIS W CONTRAST  Result Date: 06/21/2019 CLINICAL DATA:  Scrotal swelling or edema. Swelling and induration with redness of the scrotum. Concern for abscess involving the perineum. EXAM: CT PELVIS WITH CONTRAST TECHNIQUE: Multidetector CT imaging of the pelvis was performed using the standard protocol following the bolus administration of intravenous contrast. CONTRAST:  OMNIPAQUE IOHEXOL 300 MG/ML  SOLN COMPARISON:  None. FINDINGS: Urinary Tract: Distal ureters are decompressed. Urinary bladder is partially distended. There is bladder wall thickening, which may be related to nondistention. Bowel: Pelvic bowel loops are unremarkable. No evidence of inflammation. Appendix is included and normal. No perirectal fluid collection. Vascular/Lymphatic: Questionable hyperemia of left gonadal veins. No other acute vascular finding. There are prominent bilateral common, external iliac and inguinal nodes that are likely reactive. Reproductive: Diffuse bilateral scrotal skin thickening with soft tissue edema. No peripherally enhancing or organized collection. There is no soft tissue gas. Inflammatory  changes in the left tract into the inguinal canal. Questionable hyperemia of the left gonadal veins. Inflammatory changes track minimally to the perineum. Other:  No pelvic free fluid. No intrapelvic fluid collection. 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 There are no acute or suspicious osseous abnormalities. No evidence of intramuscular fluid collection. IMPRESSION: 1. Diffuse bilateral scrotal skin thickening and soft tissue edema consistent with cellulitis. Soft tissue edema is confluent, however no peripherally enhancing or organized collection. No  discrete abscess or soft tissue gas. 2. Inflammatory changes track into the left inguinal canal. Questionable hyperemia of the left gonadal veins. 3. Bladder wall thickening versus nondistention. 4. 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 Electronically Signed   By: Narda Rutherford M.D.   On: 06/21/2019 06:16    EKG: Independently reviewed.  Sinus tachycardia 110 bpm  Assessment/Plan Sepsis secondary to scrotal cellulitis: Patient presents with scrotal pain, redness, and swelling after popping a pimple.  At home patient reported being febrile up to 102 F.  In the ED he was found to be tachycardic with WBC elevated up to 21.4.  Lactic acid was reassuring at 0.7.  CT abdomen pelvis revealed diffuse edema of the scrotum consistent with cellulitis.  Patient had been started on empiric antibiotics of vancomycin and Zosyn. -Admit to a MedSurg bed -Follow-up blood cultures -Continue vancomycin and Zosyn -Oxycodone prn pain  -Scrotal support -Recheck CBC in a.m.  Hyponatremia: Acute.  Initial sodium noted to be 128 on admission.  Suspect secondary to above in addition to patient being on diuretics of hydrochlorothiazide.  Patient had been given 1 L normal saline fluids. -Continue normal saline IV fluids 100 mL/h -Recheck sodium in a.m.  Hypokalemia: Acute.  Potassium just mildly low at 3.4.  Patient on a diuretic which is likely contributing. -Give potassium chloride 30 mEq x1 dose now  Essential hypertension: Blood pressure currently within normal limits.  Home medications include amlodipine 10 mg daily, and hydrochlorothiazide 12.5 mg daily. -Hold hydrochlorothiazide -Continue amlodipine  Alcohol use: Patient reports drinking normally on the weekends 24 pack of beer.  Denies any previous history of alcohol withdrawals. -CIWA protocol  DVT prophylaxis: Lovenox Code Status: Full Family Communication: No family requested to be updated. Disposition  Plan: Likely discharge home in 2 to 3 days Consults called: None Admission status: Inpatient  Clydie Braun MD [facility] Pager [phone]   If 7PM-7AM, please contact night-coverage [email or web address] Password [email or web address]  06/21/2019, 8:10 AM

## 2019-06-21 NOTE — Plan of Care (Signed)

## 2019-06-21 NOTE — ED Notes (Signed)
hospitalist at bedside

## 2019-06-21 NOTE — Progress Notes (Signed)
Pharmacy Antibiotic Note  Warren Ross is a 34 y.o. male admitted on 06/21/2019 with cellulitis.  Pharmacy has been consulted for Vancomycin and Zosyn  Dosing.  Vancomycin 1500 mg IV given in ED at 0730  Plan: Vancomycin 1500 IV q12h Est AUC 520 Zosyn 3.375 g IV q8h   Height: 6\' 1"  (185.4 cm) Weight: 190 lb (86.2 kg) IBW/kg (Calculated) : 79.9  Temp (24hrs), Avg:98.7 F (37.1 C), Min:98.7 F (37.1 C), Max:98.7 F (37.1 C)  Recent Labs  Lab 06/21/19 0447 06/21/19 0614  WBC 21.4*  --   CREATININE 1.08  --   LATICACIDVEN  --  0.7    Estimated Creatinine Clearance: 106.9 mL/min (by C-G formula based on SCr of 1.08 mg/dL).    No Known Allergies   06/23/19 06/21/2019 7:52 AM

## 2019-06-21 NOTE — ED Provider Notes (Signed)
[facility] EMERGENCY DEPARTMENT Provider Note   CSN: [id number] Arrival date & time: 06/21/19  2956     History Chief Complaint  Patient presents with  . Testicle Pain    Warren Ross is a 34 y.o. male who presents with scrotal pain. Patient states that he had a pimple on his scrotum yesterday that he tried to pop. In the last 24 hours he has had rapid spread of redness, swelling and sever pain in the scrotum and perineum. He has never had anything like this before. He denies penile discharge. He has a hx of etoh dependence and tobacco abuse. He denies IVDU or DM. He does not   HPI     Past Medical History:  Diagnosis Date  . Hypertension   . Right carpal tunnel syndrome 01/26/2019    Patient Active Problem List   Diagnosis Date Noted  . Right carpal tunnel syndrome 01/26/2019  . GAD (generalized anxiety disorder) 12/03/2018  . Hyperlipidemia 12/03/2018  . Tobacco use 01/16/2017  . Essential hypertension 01/16/2017  . Alcohol dependence ([place]) 01/16/2017    Past Surgical History:  Procedure Laterality Date  . HAND SURGERY Left   . MANDIBLE SURGERY Left 2017       Family History  Problem Relation Age of Onset  . COPD Mother   . Early death Father   . Diabetes Maternal Grandfather     Social History   Tobacco Use  . Smoking status: Current Every Day Smoker    Packs/day: 1.00  . Smokeless tobacco: Former Network engineer Use Topics  . Alcohol use: Yes    Alcohol/week: 98.0 standard drinks    Types: 98 Cans of beer per week    Comment: "close to 24 beers a day"  . Drug use: No    Home Medications Prior to Admission medications   Medication Sig Start Date End Date Taking? Authorizing Provider  amLODipine (NORVASC) 10 MG tablet Take 1 tablet (10 mg total) by mouth daily. 12/03/18   [place], Modena Nunnery, MD  hydrochlorothiazide (HYDRODIURIL) 12.5 MG tablet Take 1 tablet (12.5 mg total) by mouth daily. 12/03/18   Alycia Rossetti, MD     Allergies    Patient has no known allergies.  Review of Systems   Review of Systems Ten systems reviewed and are negative for acute change, except as noted in the HPI.   Physical Exam Updated Vital Signs BP 104/80   Pulse (!) 154   Temp 98.7 F (37.1 C) (Oral)   Resp 20   SpO2 98%   Physical Exam Vitals and nursing note reviewed. Exam conducted with a chaperone present.  Constitutional:      General: He is not in acute distress.    Appearance: He is well-developed. He is not diaphoretic.  HENT:     Head: Normocephalic and atraumatic.  Eyes:     General: No scleral icterus.    Conjunctiva/sclera: Conjunctivae normal.  Cardiovascular:     Rate and Rhythm: Normal rate and regular rhythm.     Heart sounds: Normal heart sounds.  Pulmonary:     Effort: Pulmonary effort is normal. No respiratory distress.     Breath sounds: Normal breath sounds.  Abdominal:     Palpations: Abdomen is soft.     Tenderness: There is no abdominal tenderness.  Genitourinary:    Comments: Normal male anatomy Scrotum is markedly edematous, indurated, erythematous and exquisitely ttp. There is induration and erythema extending into the perineum Musculoskeletal:  Cervical back: Normal range of motion and neck supple.  Skin:    General: Skin is warm and dry.  Neurological:     Mental Status: He is alert.  Psychiatric:        Behavior: Behavior normal.     ED Results / Procedures / Treatments   Labs (all labs ordered are listed, but only abnormal results are displayed) Labs Reviewed  BASIC METABOLIC PANEL - Abnormal; Notable for the following components:      Result Value   Sodium 128 (*)    Potassium 3.4 (*)    CO2 19 (*)    Glucose, Bld 108 (*)    All other components within normal limits  CBC WITH DIFFERENTIAL/PLATELET - Abnormal; Notable for the following components:   WBC 21.4 (*)    Neutro Abs 16.9 (*)    Monocytes Absolute 2.1 (*)    Abs Immature Granulocytes 0.16 (*)    All  other components within normal limits  CBG MONITORING, ED    EKG None  Radiology No results found.  Procedures Procedures (including critical care time)  Medications Ordered in ED Medications  oxyCODONE-acetaminophen (PERCOCET/ROXICET) 5-325 MG per tablet 2 tablet (2 tablets Oral Given 06/21/19 0453)  ondansetron (ZOFRAN-ODT) disintegrating tablet 4 mg (4 mg Oral Given 06/21/19 0453)  iohexol (OMNIPAQUE) 300 MG/ML solution 100 mL (100 mLs Intravenous Contrast Given 06/21/19 0529)    ED Course  I have reviewed the triage vital signs and the nursing notes.  Pertinent labs & imaging results that were available during my care of the patient were reviewed by me and considered in my medical decision making (see chart for details).  Clinical Course as of Jun 20 548  Tue Jun 21, 2019  0545 Sodium(!): 128 [AH]    Clinical Course User Index [AH] Arthor Captain, PA-C   MDM Rules/Calculators/A&P                      BM:WUXLKGM pain VS: HDS, afebrile WN:UUVOZDG is gathered by paitnet and emr. UYQ:IHKVQQV, cellilituis, less likely fournier's or fungal infection. Doubt torsion Labs: I reviewed the labs which show  Marked leukocytosis, hyponatremia to 128. Remainder o labs are clnically insignificant or wnl Imaging: I personally reviewed the images (ct pelvis w contrast) which show(s) cellulitis of the scrotum without fluid collection on my interpretation EKG:ECG interpretation   Date: 06/21/2019  Rate: 110  Rhythm:  sinus tachycardia  QRS Axis: normal  Intervals: normal  ST/T Wave abnormalities: normal  Conduction Disutrbances: none  Narrative Interpretation:   Old EKG Reviewed: none    ZDG:LOVFIEP with scrotal cellulitis. Started on Vanc and Zosyn.  Patient will be admitted by hospitalist service . The patient appears reasonably stabilized for admission considering the current resources, flow, and capabilities available in the ED at this time, and I doubt any other [facility]  requiring further screening and/or treatment in the ED prior to admission.  Final Clinical Impression(s) / ED Diagnoses Final diagnoses:  None    Rx / DC Orders ED Discharge Orders    None       Arthor Captain, PA-C 06/21/19 1911    Shon Baton, MD 06/22/19 [phone]

## 2019-06-21 NOTE — ED Triage Notes (Signed)
Pt reports that he got a ingrown hair on his L testicle yesterday and that he attempted to drain it and swelling has now moved to the R side, fevers at home.

## 2019-06-21 NOTE — ED Provider Notes (Signed)
MSE was initiated and I personally evaluated the patient and placed orders (if any) at  4:48 AM on June 21, 2019.  The patient appears stable so that the remainder of the MSE may be completed by another provider.  Patient seen in triage for scrotal pain. The patient states that he popped a pimple on his scrotum several days ago. He has had worsening redness, pain and swelling involving his scroutm and perineum. He denies a hx of DM.  On exam the patient has severe redness, swelling, induration of the scrotum through the perineum.  I have ordered pain medication, labs, and a CT pelvis with contrast.   Arthor Captain, PA-C 06/21/19 3716    Shon Baton, MD 06/21/19 [phone]

## 2019-06-21 NOTE — ED Notes (Signed)
Lunch Tray Ordered @ 1056. 

## 2019-06-22 DIAGNOSIS — N492 Inflammatory disorders of scrotum: Secondary | ICD-10-CM

## 2019-06-22 DIAGNOSIS — Z7289 Other problems related to lifestyle: Secondary | ICD-10-CM

## 2019-06-22 DIAGNOSIS — I10 Essential (primary) hypertension: Secondary | ICD-10-CM

## 2019-06-22 DIAGNOSIS — Z720 Tobacco use: Secondary | ICD-10-CM

## 2019-06-22 LAB — CBC WITH DIFFERENTIAL/PLATELET
Abs Immature Granulocytes: 0.12 10*3/uL — ABNORMAL HIGH (ref 0.00–0.07)
Basophils Absolute: 0.1 10*3/uL (ref 0.0–0.1)
Basophils Relative: 1 %
Eosinophils Absolute: 0.1 10*3/uL (ref 0.0–0.5)
Eosinophils Relative: 1 %
HCT: 42.7 % (ref 39.0–52.0)
Hemoglobin: 14.6 g/dL (ref 13.0–17.0)
Immature Granulocytes: 1 %
Lymphocytes Relative: 11 %
Lymphs Abs: 2.1 10*3/uL (ref 0.7–4.0)
MCH: 32.5 pg (ref 26.0–34.0)
MCHC: 34.2 g/dL (ref 30.0–36.0)
MCV: 95.1 fL (ref 80.0–100.0)
Monocytes Absolute: 2.1 10*3/uL — ABNORMAL HIGH (ref 0.1–1.0)
Monocytes Relative: 12 %
Neutro Abs: 13.7 10*3/uL — ABNORMAL HIGH (ref 1.7–7.7)
Neutrophils Relative %: 74 %
Platelets: 226 10*3/uL (ref 150–400)
RBC: 4.49 MIL/uL (ref 4.22–5.81)
RDW: 13.2 % (ref 11.5–15.5)
WBC: 18.2 10*3/uL — ABNORMAL HIGH (ref 4.0–10.5)
nRBC: 0 % (ref 0.0–0.2)

## 2019-06-22 LAB — PHOSPHORUS: Phosphorus: 3.1 mg/dL (ref 2.5–4.6)

## 2019-06-22 LAB — BASIC METABOLIC PANEL
Anion gap: 14 (ref 5–15)
BUN: 5 mg/dL — ABNORMAL LOW (ref 6–20)
CO2: 25 mmol/L (ref 22–32)
Calcium: 8.9 mg/dL (ref 8.9–10.3)
Chloride: 100 mmol/L (ref 98–111)
Creatinine, Ser: 0.94 mg/dL (ref 0.61–1.24)
GFR calc Af Amer: >60 mL/min (ref >60)
GFR calc non Af Amer: >60 mL/min (ref >60)
Glucose, Bld: 91 mg/dL (ref 70–99)
Potassium: 3.6 mmol/L (ref 3.5–5.1)
Sodium: 139 mmol/L (ref 135–145)

## 2019-06-22 LAB — HIV ANTIBODY (ROUTINE TESTING W REFLEX): HIV Screen 4th Generation wRfx: NONREACTIVE

## 2019-06-22 LAB — MAGNESIUM: Magnesium: 1.9 mg/dL (ref 1.7–2.4)

## 2019-06-22 MED ORDER — MORPHINE SULFATE (PF) 2 MG/ML IV SOLN: 2.0000 mg | INTRAVENOUS | Status: DC | PRN

## 2019-06-22 NOTE — Progress Notes (Addendum)
PROGRESS NOTE  Warren Ross EZM:[id number] DOB: 03-19-1986 DOA: 06/21/2019 PCP: Salley Scarlet, MD  HPI/Recap of past 24 hours: HPI from Dr Micheal Likens is a 34 y.o. male with medical history significant for HTN, presents with complaints of scrotal pain and swelling.  Reports that about 4 days ago he had developed an ingrown hair on his scrotum, tried to pop it,m later become red, swollen, and painful. At home patient reports having associated symptoms of a fever up to 102 F. Admits to smoking 1 pack cigarettes per day on average and usually drinks a 12 pack of beer, but usually only on the weekends.  Denies any history of alcohol withdrawals. In the ED, noted afebrile with heart rates into the 150s, and all other vital signs maintained.  Labs significant for WBC 21.4, sodium 128, potassium 3.4, and lactic acid 0.7. Blood cultures have been obtained. Patient was started on empiric antibiotics and admitted for further management.    Today, patient still reports scrotal pain, swelling and erythema. Unable to ambulate as well as keep his legs close together due to significant scrotal pain and swelling. Denies any drainage/purulent discharge, abdominal pain, nausea/vomiting, chest pain, shortness of breath, fever/chills.   Assessment/Plan: Principal Problem:   Cellulitis Active Problems:   Tobacco use   Essential hypertension   Hypokalemia   Alcohol use  Sepsis 2/2 scrotal cellulitis On admission, tachycardic, leukocytosis Currently afebrile with leukocytosis BC x2 pending CT pelvis showed diffuse bilateral scrotal skin thickening and soft tissue edema consistent with cellulitis, no discrete abscess or soft tissue gas noted Continue Vanco/Zosyn for now, plan to de-escalate once significant improvement Scrotal elevation, pain management Monitor closely  Hypertension Continue home amlodipine  Alcohol abuse/tobacco abuse Binge drinking on weekends about 24 pack of  beer Advised against alcohol abuse, tobacco abuse CIWA protocol Nicotine patch       Malnutrition Type:      Malnutrition Characteristics:      Nutrition Interventions:       Estimated body mass index is 25.07 kg/m as calculated from the following:   Height as of this encounter: 6\' 1"  (1.854 m).   Weight as of this encounter: 86.2 kg.     Code Status: Full  Family Communication: None at bedside  Disposition Plan: Likely home once significant improvement   Consultants:  None  Procedures:  None  Antimicrobials:  Vancomycin  Zosyn  DVT prophylaxis: Lovenox   Objective: Vitals:   06/21/19 1500 06/21/19 1956 06/22/19 0400 06/22/19 0743  BP:  119/85 114/81 118/83  Pulse: 100 (!) 108 92 97  Resp:  18  19  Temp:  100.2 F (37.9 C) 99.4 F (37.4 C) 98.3 F (36.8 C)  TempSrc:  Oral Oral Oral  SpO2:  100% 100% 100%  Weight:      Height:        Intake/Output Summary (Last 24 hours) at 06/22/2019 1505 Last data filed at 06/22/2019 1240 Gross per 24 hour  Intake 1260 ml  Output 1575 ml  Net -315 ml   Filed Weights   06/21/19 0640  Weight: 86.2 kg    Exam:  General: NAD   Cardiovascular: S1, S2 present  Respiratory: CTAB  Abdomen: Soft, nontender, nondistended, bowel sounds present  GU: Scrotal swelling, erythema, pain  Musculoskeletal: No bilateral pedal edema noted  Skin: Normal  Psychiatry: Normal mood    Data Reviewed: CBC: Recent Labs  Lab 06/21/19 0447 06/22/19 0247  WBC 21.4* 18.2*  NEUTROABS  16.9* 13.7*  HGB 17.0 14.6  HCT 47.8 42.7  MCV 89.7 95.1  PLT 273 226   Basic Metabolic Panel: Recent Labs  Lab 06/21/19 0447 06/22/19 0247  NA 128* 139  K 3.4* 3.6  CL 100 100  CO2 19* 25  GLUCOSE 108* 91  BUN 6 5*  CREATININE 1.08 0.94  CALCIUM 9.1 8.9  MG  --  1.9  PHOS  --  3.1   GFR: Estimated Creatinine Clearance: 122.8 mL/min (by C-G formula based on SCr of 0.94 mg/dL). Liver Function Tests: No  results for input(s): AST, ALT, ALKPHOS, BILITOT, PROT, ALBUMIN in the last 168 hours. No results for input(s): LIPASE, AMYLASE in the last 168 hours. No results for input(s): AMMONIA in the last 168 hours. Coagulation Profile: No results for input(s): INR, PROTIME in the last 168 hours. Cardiac Enzymes: No results for input(s): CKTOTAL, CKMB, CKMBINDEX, TROPONINI in the last 168 hours. BNP (last 3 results) No results for input(s): PROBNP in the last 8760 hours. HbA1C: No results for input(s): HGBA1C in the last 72 hours. CBG: Recent Labs  Lab 06/21/19 0559  GLUCAP 103*   Lipid Profile: No results for input(s): CHOL, HDL, LDLCALC, TRIG, CHOLHDL, LDLDIRECT in the last 72 hours. Thyroid Function Tests: No results for input(s): TSH, T4TOTAL, FREET4, T3FREE, THYROIDAB in the last 72 hours. Anemia Panel: No results for input(s): VITAMINB12, FOLATE, FERRITIN, TIBC, IRON, RETICCTPCT in the last 72 hours. Urine analysis: No results found for: COLORURINE, APPEARANCEUR, LABSPEC, PHURINE, GLUCOSEU, HGBUR, BILIRUBINUR, KETONESUR, PROTEINUR, UROBILINOGEN, NITRITE, LEUKOCYTESUR Sepsis Labs: @LABRCNTIP (procalcitonin:4,lacticidven:4)  ) Recent Results (from the past 240 hour(s))  Blood culture (routine x 2)     Status: None (Preliminary result)   Collection Time: 06/21/19  7:54 AM   Specimen: BLOOD  Result Value Ref Range Status   Specimen Description BLOOD RIGHT ANTECUBITAL  Final   Special Requests   Final    BOTTLES DRAWN AEROBIC AND ANAEROBIC Blood Culture results may not be optimal due to an excessive volume of blood received in culture bottles   Culture   Final    NO GROWTH < 24 HOURS Performed at [facility], [address], [place], [place]    Report Status PENDING  Incomplete  Blood culture (routine x 2)     Status: None (Preliminary result)   Collection Time: 06/21/19  7:54 AM   Specimen: BLOOD  Result Value Ref Range Status   Specimen Description BLOOD LEFT  ANTECUBITAL  Final   Special Requests   Final    BOTTLES DRAWN AEROBIC AND ANAEROBIC Blood Culture results may not be optimal due to an excessive volume of blood received in culture bottles   Culture   Final    NO GROWTH < 24 HOURS Performed at [facility], [address] [place], [place]    Report Status PENDING  Incomplete  SARS Coronavirus 2 by RT PCR     Status: None   Collection Time: 06/21/19 10:09 AM  Result Value Ref Range Status   SARS Coronavirus 2 NEGATIVE NEGATIVE Final    Comment: (NOTE) Result indicates the ABSENCE of SARS-CoV-2 RNA in the patient specimen.  The lowest concentration of SARS-CoV-2 viral copies this assay can detect in nasopharyngeal swab specimens is 500 copies / mL.  A negative result does not preclude SARS-CoV-2 infection and should not be used as the sole basis for patient management decisions. A negative result may occur with improper specimen collection /  handling, submission of a specimen other than nasopharyngeal swab, presence of viral mutation(s) within the areas targeted by this assay, and inadequate number of viral copies (<500 copies / mL) present.  Negative results must be combined with clinical observations, patient history, and epidemiological information.  The expected result is NEGATIVE.  Patient Fact Sheet:  BlogSelections.co.uk   Provider Fact Sheet:  https://lucas.com/   This test is not yet approved or cleared by the [place] FDA and  has been authorized for  detection and/or diagnosis of SARS-CoV-2 by FDA under an Emergency Use Authorization (EUA).  This EUA will remain in effect (meaning this test can be used) for the duration of  the COVID-19 declaration under Section 564(b)(1) of the Act, 21 U.S.C. section 360bbb-3(b)(1), unless the authorization is terminated or revoked sooner Performed at [facility], [place] [place], [place] [address]         Studies: No results found.  Scheduled Meds: . amLODipine  10 mg Oral Daily  . enoxaparin (LOVENOX) injection  40 mg Subcutaneous Q24H  . folic acid  1 mg Oral Daily  . multivitamin with minerals  1 tablet Oral Daily  . nicotine  21 mg Transdermal Daily  . thiamine  100 mg Oral Daily   Or  . thiamine  100 mg Intravenous Daily    Continuous Infusions: . sodium chloride 100 mL/hr at 06/21/19 1303  . piperacillin-tazobactam (ZOSYN)  IV 3.375 g (06/22/19 0526)  . vancomycin 1,500 mg (06/22/19 0825)     LOS: 1 day     Alma Friendly, MD [facility]  If 7PM-7AM, please contact night-coverage [email or web address] 06/22/2019, 3:05 PM

## 2019-06-22 NOTE — Plan of Care (Signed)
  Problem: Education: Goal: Knowledge of General Education information will improve Description: Including pain rating scale, medication(s)/side effects and non-pharmacologic comfort measures Outcome: Progressing   Problem: Health Behavior/Discharge Planning: Goal: Ability to manage health-related needs will improve Outcome: Progressing   Problem: Clinical Measurements: Goal: Will remain free from infection Outcome: Progressing   Problem: Pain Managment: Goal: General experience of comfort will improve Outcome: Progressing   Problem: Safety: Goal: Ability to remain free from injury will improve Outcome: Progressing   Problem: Skin Integrity: Goal: Risk for impaired skin integrity will decrease Outcome: Progressing   

## 2019-06-22 NOTE — Plan of Care (Signed)
  Problem: Education: Goal: Knowledge of General Education information will improve Description: Including pain rating scale, medication(s)/side effects and non-pharmacologic comfort measures Outcome: Progressing   Problem: Clinical Measurements: Goal: Will remain free from infection Outcome: Progressing   Problem: Activity: Goal: Risk for activity intolerance will decrease Outcome: Progressing   Problem: Coping: Goal: Level of anxiety will decrease Outcome: Progressing   Problem: Pain Managment: Goal: General experience of comfort will improve Outcome: Progressing   Problem: Skin Integrity: Goal: Risk for impaired skin integrity will decrease Outcome: Progressing

## 2019-06-23 DIAGNOSIS — L0390 Cellulitis, unspecified: Secondary | ICD-10-CM

## 2019-06-23 LAB — CBC WITH DIFFERENTIAL/PLATELET
Abs Immature Granulocytes: 0.06 10*3/uL (ref 0.00–0.07)
Basophils Absolute: 0.1 10*3/uL (ref 0.0–0.1)
Basophils Relative: 1 %
Eosinophils Absolute: 0.3 10*3/uL (ref 0.0–0.5)
Eosinophils Relative: 2 %
HCT: 40.4 % (ref 39.0–52.0)
Hemoglobin: 13.6 g/dL (ref 13.0–17.0)
Immature Granulocytes: 1 %
Lymphocytes Relative: 14 %
Lymphs Abs: 1.6 10*3/uL (ref 0.7–4.0)
MCH: 32.2 pg (ref 26.0–34.0)
MCHC: 33.7 g/dL (ref 30.0–36.0)
MCV: 95.7 fL (ref 80.0–100.0)
Monocytes Absolute: 1.3 10*3/uL — ABNORMAL HIGH (ref 0.1–1.0)
Monocytes Relative: 12 %
Neutro Abs: 8.1 10*3/uL — ABNORMAL HIGH (ref 1.7–7.7)
Neutrophils Relative %: 70 %
Platelets: 222 10*3/uL (ref 150–400)
RBC: 4.22 MIL/uL (ref 4.22–5.81)
RDW: 13.2 % (ref 11.5–15.5)
WBC: 11.4 10*3/uL — ABNORMAL HIGH (ref 4.0–10.5)
nRBC: 0 % (ref 0.0–0.2)

## 2019-06-23 LAB — BASIC METABOLIC PANEL
Anion gap: 11 (ref 5–15)
BUN: <5 mg/dL — ABNORMAL LOW (ref 6–20)
CO2: 25 mmol/L (ref 22–32)
Calcium: 8.4 mg/dL — ABNORMAL LOW (ref 8.9–10.3)
Chloride: 100 mmol/L (ref 98–111)
Creatinine, Ser: 0.97 mg/dL (ref 0.61–1.24)
GFR calc Af Amer: >60 mL/min (ref >60)
GFR calc non Af Amer: >60 mL/min (ref >60)
Glucose, Bld: 111 mg/dL — ABNORMAL HIGH (ref 70–99)
Potassium: 3.5 mmol/L (ref 3.5–5.1)
Sodium: 136 mmol/L (ref 135–145)

## 2019-06-23 MED ORDER — DOXYCYCLINE MONOHYDRATE 100 MG PO TABS: 100.0000 mg | ORAL_TABLET | Freq: Two times a day (BID) | ORAL | 0 refills | Status: DC

## 2019-06-23 MED ORDER — OXYCODONE-ACETAMINOPHEN 5-325 MG PO TABS: 1.0000 | ORAL_TABLET | Freq: Four times a day (QID) | ORAL | 0 refills | Status: AC | PRN

## 2019-06-23 NOTE — Progress Notes (Signed)
Discharge instructions addressed;Pt in stable condition; Pt to be pick up by partner at the [employer] entrance.

## 2019-06-23 NOTE — Discharge Summary (Signed)
Discharge Summary  Warren Ross [id number]:[id number] DOB: November 16, 1985  PCP: Salley Scarlet, MD  Admit date: 06/21/2019 Discharge date: 06/23/2019  Time spent: 40 mins  Recommendations for Outpatient Follow-up:  1. Follow-up with PCP in 1 week  Discharge Diagnoses:  Active Hospital Problems   Diagnosis Date Noted  . Cellulitis 06/21/2019  . Hypokalemia 06/21/2019  . Alcohol use 06/21/2019  . Tobacco use 01/16/2017  . Essential hypertension 01/16/2017    Resolved Hospital Problems  No resolved problems to display.    Discharge Condition: Stable  Diet recommendation: Regular  Vitals:   06/23/19 0348 06/23/19 0819  BP: 109/74 113/79  Pulse: 84 83  Resp: 19 17  Temp: 98.2 F (36.8 C) 97.6 F (36.4 C)  SpO2: 96% 100%    History of present illness:  Warren A Cauthrenis a 34 y.o.malewith medical history significant for HTN, presents with complaints of scrotal pain and swelling. Reports that about 4 days ago he had developed an ingrown hair on his scrotum, tried to pop it,m later become red, swollen, and painful. At home patient reports having associated symptoms of a fever up to 102 F. Admits to smoking 1 pack cigarettes per day on average and usually drinks a 12 pack of beer, but usually only on the weekends. Denies any history of alcohol withdrawals. In the ED, noted afebrile with heart rates into the 150s, and all other vital signs maintained. Labs significant for WBC 21.4, sodium 128, potassium 3.4, and lactic acid 0.7. Blood cultures have been obtained. Patient was started on empiric antibiotics and admitted for further management.    Today, patient reports scrotal pain, swelling and erythema has improved, able to ambulate to the bathroom or back.  Patient denies any drainage/purulent discharge, abdominal pain, chest pain, shortness of breath, fever/chills.  Patient very eager to be discharged.    Hospital Course:  Principal Problem:   Cellulitis Active  Problems:   Tobacco use   Essential hypertension   Hypokalemia   Alcohol use   Sepsis 2/2 scrotal cellulitis On admission, tachycardic, leukocytosis Currently afebrile with leukocytosis BC x2 NGTD CT pelvis showed diffuse bilateral scrotal skin thickening and soft tissue edema consistent with cellulitis, no discrete abscess or soft tissue gas noted S/P Vanco/Zosyn X 2 days, switch to PO doxycycline for another 8 days for a total of 10 days Advised scrotal elevation, pain management Follow up with PCP in 1 week  Hypertension Continue home amlodipine  Alcohol abuse/tobacco abuse Binge drinking on weekends about 24 pack of beer Advised against alcohol abuse, tobacco abuse         Malnutrition Type:      Malnutrition Characteristics:      Nutrition Interventions:      Estimated body mass index is 25.07 kg/m as calculated from the following:   Height as of this encounter: 6\' 1"  (1.854 m).   Weight as of this encounter: 86.2 kg.    Procedures:  None  Consultations:  None  Discharge Exam: BP 113/79 (BP Location: Left Arm)   Pulse 83   Temp 97.6 F (36.4 C) (Oral)   Resp 17   Ht 6\' 1"  (1.854 m)   Wt 86.2 kg   SpO2 100%   BMI 25.07 kg/m   General: NAD Cardiovascular: S1, S2 present Respiratory: CTAB GU: Mild scrotal swelling, mildly tender   Discharge Instructions You were cared for by a hospitalist during your hospital stay. If you have any questions about your discharge medications or the care you  received while you were in the hospital after you are discharged, you can call the unit and asked to speak with the hospitalist on call if the hospitalist that took care of you is not available. Once you are discharged, your primary care physician will handle any further medical issues. Please note that NO REFILLS for any discharge medications will be authorized once you are discharged, as it is imperative that you return to your primary care physician  (or establish a relationship with a primary care physician if you do not have one) for your aftercare needs so that they can reassess your need for medications and monitor your lab values.  Discharge Instructions    Diet - low sodium heart healthy   Complete by: As directed    Increase activity slowly   Complete by: As directed      Allergies as of 06/23/2019   No Known Allergies     Medication List    TAKE these medications   amLODipine 10 MG tablet Commonly known as: NORVASC Take 1 tablet (10 mg total) by mouth daily.   doxycycline 100 MG tablet Commonly known as: ADOXA Take 1 tablet (100 mg total) by mouth 2 (two) times daily for 8 days.   hydrochlorothiazide 12.5 MG tablet Commonly known as: HYDRODIURIL Take 1 tablet (12.5 mg total) by mouth daily.   oxyCODONE-acetaminophen 5-325 MG tablet Commonly known as: PERCOCET/ROXICET Take 1 tablet by mouth every 6 (six) hours as needed for up to 3 days for moderate pain.      No Known Allergies Follow-up Information    Buena, Velna Hatchet, MD. Schedule an appointment as soon as possible for a visit in 1 week(s).   Specialty: Family Medicine Contact information: [address] [place] [address] [phone]            The results of significant diagnostics from this hospitalization (including imaging, microbiology, ancillary and laboratory) are listed below for reference.    Significant Diagnostic Studies: CT PELVIS W CONTRAST  Result Date: 06/21/2019 CLINICAL DATA:  Scrotal swelling or edema. Swelling and induration with redness of the scrotum. Concern for abscess involving the perineum. EXAM: CT PELVIS WITH CONTRAST TECHNIQUE: Multidetector CT imaging of the pelvis was performed using the standard protocol following the bolus administration of intravenous contrast. CONTRAST:  OMNIPAQUE IOHEXOL 300 MG/ML  SOLN COMPARISON:  None. FINDINGS: Urinary Tract: Distal ureters are decompressed. Urinary bladder is  partially distended. There is bladder wall thickening, which may be related to nondistention. Bowel: Pelvic bowel loops are unremarkable. No evidence of inflammation. Appendix is included and normal. No perirectal fluid collection. Vascular/Lymphatic: Questionable hyperemia of left gonadal veins. No other acute vascular finding. There are prominent bilateral common, external iliac and inguinal nodes that are likely reactive. Reproductive: Diffuse bilateral scrotal skin thickening with soft tissue edema. No peripherally enhancing or organized collection. There is no soft tissue gas. Inflammatory changes in the left tract into the inguinal canal. Questionable hyperemia of the left gonadal veins. Inflammatory changes track minimally to the perineum. Other:  No pelvic free fluid. No intrapelvic fluid collection. 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 There are no acute or suspicious osseous abnormalities. No evidence of intramuscular fluid collection. IMPRESSION: 1. Diffuse bilateral scrotal skin thickening and soft tissue edema consistent with cellulitis. Soft tissue edema is confluent, however no peripherally enhancing or organized collection. No discrete abscess or soft tissue gas. 2. Inflammatory changes track into the  left inguinal canal. Questionable hyperemia of the left gonadal veins. 3. Bladder wall thickening versus nondistention. 4. 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 Electronically Signed   By: Narda Rutherford M.D.   On: 06/21/2019 06:16    Microbiology: Recent Results (from the past 240 hour(s))  Blood culture (routine x 2)     Status: None (Preliminary result)   Collection Time: 06/21/19  7:54 AM   Specimen: BLOOD  Result Value Ref Range Status   Specimen Description BLOOD RIGHT ANTECUBITAL  Final   Special Requests   Final    BOTTLES DRAWN AEROBIC AND ANAEROBIC Blood Culture results may  not be optimal due to an excessive volume of blood received in culture bottles   Culture   Final    NO GROWTH 2 DAYS Performed at [facility], [address], [place], [place] [address]    Report Status PENDING  Incomplete  Blood culture (routine x 2)     Status: None (Preliminary result)   Collection Time: 06/21/19  7:54 AM   Specimen: BLOOD  Result Value Ref Range Status   Specimen Description BLOOD LEFT ANTECUBITAL  Final   Special Requests   Final    BOTTLES DRAWN AEROBIC AND ANAEROBIC Blood Culture results may not be optimal due to an excessive volume of blood received in culture bottles   Culture   Final    NO GROWTH 2 DAYS Performed at [facility], [address], [place], [place] [address]    Report Status PENDING  Incomplete  SARS Coronavirus 2 by RT PCR     Status: None   Collection Time: 06/21/19 10:09 AM  Result Value Ref Range Status   SARS Coronavirus 2 NEGATIVE NEGATIVE Final    Comment: (NOTE) Result indicates the ABSENCE of SARS-CoV-2 RNA in the patient specimen.  The lowest concentration of SARS-CoV-2 viral copies this assay can detect in nasopharyngeal swab specimens is 500 copies / mL.  A negative result does not preclude SARS-CoV-2 infection and should not be used as the sole basis for patient management decisions. A negative result may occur with improper specimen collection / handling, submission of a specimen other than nasopharyngeal swab, presence of viral mutation(s) within the areas targeted by this assay, and inadequate number of viral copies (<500 copies / mL) present.  Negative results must be combined with clinical observations, patient history, and epidemiological information.  The expected result is NEGATIVE.  Patient Fact Sheet:  https://wong-henderson.biz/   Provider Fact Sheet:  CheapJackpot.at   This test is not yet approved or cleared by the [place] FDA and  has been authorized  for  detection and/or diagnosis of SARS-CoV-2 by FDA under an Emergency Use Authorization (EUA).  This EUA will remain in effect (meaning this test can be used) for the duration of  the COVID-19 declaration under Section 564(b)(1) of the Act, 21 U.S.C. section 360bbb-3(b)(1), unless the authorization is terminated or revoked sooner Performed at [facility], [address] [place], [place] [address]      Labs: Basic Metabolic Panel: Recent Labs  Lab 06/21/19 0447 06/22/19 0247 06/23/19 0200  NA 128* 139 136  K 3.4* 3.6 3.5  CL 100 100 100  CO2 19* 25 25  GLUCOSE 108* 91 111*  BUN 6 5* <5*  CREATININE 1.08 0.94 0.97  CALCIUM 9.1 8.9 8.4*  MG  --  1.9  --   PHOS  --  3.1  --  Liver Function Tests: No results for input(s): AST, ALT, ALKPHOS, BILITOT, PROT, ALBUMIN in the last 168 hours. No results for input(s): LIPASE, AMYLASE in the last 168 hours. No results for input(s): AMMONIA in the last 168 hours. CBC: Recent Labs  Lab 06/21/19 0447 06/22/19 0247 06/23/19 0200  WBC 21.4* 18.2* 11.4*  NEUTROABS 16.9* 13.7* 8.1*  HGB 17.0 14.6 13.6  HCT 47.8 42.7 40.4  MCV 89.7 95.1 95.7  PLT 273 226 222   Cardiac Enzymes: No results for input(s): CKTOTAL, CKMB, CKMBINDEX, TROPONINI in the last 168 hours. BNP: BNP (last 3 results) No results for input(s): BNP in the last 8760 hours.  ProBNP (last 3 results) No results for input(s): PROBNP in the last 8760 hours.  CBG: Recent Labs  Lab 06/21/19 0559  GLUCAP 103*       Signed:  Alma Friendly, MD [facility] 06/23/2019, 11:14 AM

## 2019-06-26 LAB — CULTURE, BLOOD (ROUTINE X 2)
Culture: NO GROWTH
Culture: NO GROWTH

## 2019-06-28 ENCOUNTER — Ambulatory Visit (INDEPENDENT_AMBULATORY_CARE_PROVIDER_SITE_OTHER): Payer: 59 | Admitting: Family Medicine

## 2019-06-28 ENCOUNTER — Other Ambulatory Visit: Payer: Self-pay

## 2019-06-28 ENCOUNTER — Encounter: Payer: Self-pay | Admitting: Family Medicine

## 2019-06-28 VITALS — BP 128/84 | HR 100 | Temp 97.9°F | Resp 14 | Ht 77.0 in | Wt 191.0 lb

## 2019-06-28 DIAGNOSIS — L0390 Cellulitis, unspecified: Secondary | ICD-10-CM

## 2019-06-28 DIAGNOSIS — F1020 Alcohol dependence, uncomplicated: Secondary | ICD-10-CM | POA: Diagnosis not present

## 2019-06-28 DIAGNOSIS — I10 Essential (primary) hypertension: Secondary | ICD-10-CM | POA: Diagnosis not present

## 2019-06-28 DIAGNOSIS — E876 Hypokalemia: Secondary | ICD-10-CM | POA: Diagnosis not present

## 2019-06-28 DIAGNOSIS — Z7289 Other problems related to lifestyle: Secondary | ICD-10-CM

## 2019-06-28 DIAGNOSIS — Z789 Other specified health status: Secondary | ICD-10-CM

## 2019-06-28 LAB — CBC WITH DIFFERENTIAL/PLATELET
Absolute Monocytes: 848 cells/uL (ref 200–950)
Basophils Absolute: 128 cells/uL (ref 0–200)
Basophils Relative: 1.7 %
Eosinophils Absolute: 203 cells/uL (ref 15–500)
Eosinophils Relative: 2.7 %
HCT: 46 % (ref 38.5–50.0)
Hemoglobin: 15.6 g/dL (ref 13.2–17.1)
Lymphs Abs: 2310 cells/uL (ref 850–3900)
MCH: 31.7 pg (ref 27.0–33.0)
MCHC: 33.9 g/dL (ref 32.0–36.0)
MCV: 93.5 fL (ref 80.0–100.0)
MPV: 9.4 fL (ref 7.5–12.5)
Monocytes Relative: 11.3 %
Neutro Abs: 4013 cells/uL (ref 1500–7800)
Neutrophils Relative %: 53.5 %
Platelets: 468 10*3/uL — ABNORMAL HIGH (ref 140–400)
RBC: 4.92 10*6/uL (ref 4.20–5.80)
RDW: 12.7 % (ref 11.0–15.0)
Total Lymphocyte: 30.8 %
WBC: 7.5 10*3/uL (ref 3.8–10.8)

## 2019-06-28 LAB — BASIC METABOLIC PANEL
BUN: 7 mg/dL (ref 7–25)
CO2: 27 mmol/L (ref 20–32)
Calcium: 9.6 mg/dL (ref 8.6–10.3)
Chloride: 102 mmol/L (ref 98–110)
Creat: 0.88 mg/dL (ref 0.60–1.35)
Glucose, Bld: 89 mg/dL (ref 65–99)
Potassium: 4.4 mmol/L (ref 3.5–5.3)
Sodium: 138 mmol/L (ref 135–146)

## 2019-06-28 MED ORDER — DOXYCYCLINE MONOHYDRATE 100 MG PO TABS: 100.0000 mg | ORAL_TABLET | Freq: Two times a day (BID) | ORAL | 0 refills | Status: DC

## 2019-06-28 NOTE — Progress Notes (Signed)
Subjective:    Patient ID: Warren Ross, male    DOB: [birth year], 34 y.o.   MRN: [id number]  Patient presents for Hospital F/U (celllitis of scrotum)   Pt here to f/u cellulitis of scrotum, he had a small pimple like lesion that he tried to pop, days later he had cellulitis symptoms with fever 102F. Admitted to the hospital with WBC  21.4 , NA 128 K 3.4 and lactic acid of 0.7. Blood cultures were negative. Treated with IV Vanc and trainstioned to doxycycline which he still taking.  Of note his WBC did come down to 11,000 before discharge his sodium was back to baseline and his potassium repleted while in the hospital and was 3.5 at discharge. He still has swelling but the pain is significantly better.  He has not required the oxycodone acetaminophen the past couple of days.  He has not had any further fever since he went to the hospital He is urinating normally bowel movements are normal  HTN- continued on norvasc, HCTZ held due to hyponamtremia and he states due to interaction with his antibiotic.  Pressure remained normal while in the hospital on just amlodipine   Alcohol use states that he typically drinks a case over the weekend.  I think there was a misconception that he was drinking a case of beer every night which is not the case.  Cig down 1 ppd, also using vape    Review Of Systems:  GEN- denies fatigue, fever, weight loss,weakness, recent illness HEENT- denies eye drainage, change in vision, nasal discharge, CVS- denies chest pain, palpitations RESP- denies SOB, cough, wheeze ABD- denies N/V, change in stools, abd pain GU- denies dysuria, hematuria, dribbling, incontinence MSK- denies joint pain, muscle aches, injury Neuro- denies headache, dizziness, syncope, seizure activity       Objective:    BP 128/84   Pulse 100   Temp 97.9 F (36.6 C) (Temporal)   Resp 14   Ht 6\' 5"  (1.956 m)   Wt 191 lb (86.6 kg)   SpO2 99%   BMI 22.65 kg/m  GEN- NAD, alert and  oriented x3 HEENT- PERRL, EOMI, non injected sclera, pink conjunctiva, CVS- RRR, no murmur RESP-CTAB ABD-NABS,soft,NT,ND GU scrotal swelling with induration tracking up the left inguinal region mild erythema at original site on the left scrotal side mild tenderness to palpation. EXT- No edema Pulses- Radial2+        Assessment & Plan:      Problem List Items Addressed This Visit      Unprioritized   RESOLVED: Alcohol dependence (HCC)   Alcohol use    He only drinks alcohol on the weekend when he is not working.  He has not had any withdrawal symptoms.  Hyponatremia has resolved we will recheck labs again today 1 week out of the hospital.      Cellulitis    Cellulitis is significantly improving.  He still has some swelling and induration.  I would extend his doxycycline to a total of 10 days.  I have given him a note for work will plan to return on Monday.  For some reason Monday he still has significant swelling and pain will need to be reevaluated.      Relevant Orders   CBC with Differential/Platelet   Basic metabolic panel   Essential hypertension - Primary    Pressure is controlled on amlodipine at this time we will continue.  Hold off on the HCTZ for now.  Relevant Orders   CBC with Differential/Platelet   Hypokalemia   Relevant Orders   Basic metabolic panel      Note: This dictation was prepared with Dragon dictation along with smaller phrase technology. Any transcriptional errors that result from this process are unintentional.

## 2019-06-28 NOTE — Assessment & Plan Note (Signed)
Cellulitis is significantly improving.  He still has some swelling and induration.  I would extend his doxycycline to a total of 10 days.  I have given him a note for work will plan to return on Monday.  For some reason Monday he still has significant swelling and pain will need to be reevaluated.

## 2019-06-28 NOTE — Patient Instructions (Addendum)
F/U 3 months for Physical  

## 2019-06-28 NOTE — Assessment & Plan Note (Signed)
Pressure is controlled on amlodipine at this time we will continue.  Hold off on the HCTZ for now.

## 2019-06-28 NOTE — Assessment & Plan Note (Signed)
He only drinks alcohol on the weekend when he is not working.  He has not had any withdrawal symptoms.  Hyponatremia has resolved we will recheck labs again today 1 week out of the hospital.

## 2019-06-28 NOTE — Assessment & Plan Note (Deleted)
He only drinks alcohol on the weekend when he is not working.  He has not had any withdrawal symptoms.

## 2019-07-04 ENCOUNTER — Ambulatory Visit (INDEPENDENT_AMBULATORY_CARE_PROVIDER_SITE_OTHER): Payer: 59 | Admitting: Family Medicine

## 2019-07-04 ENCOUNTER — Encounter: Payer: Self-pay | Admitting: Family Medicine

## 2019-07-04 ENCOUNTER — Encounter: Payer: Self-pay | Admitting: *Deleted

## 2019-07-04 ENCOUNTER — Other Ambulatory Visit: Payer: Self-pay

## 2019-07-04 ENCOUNTER — Telehealth: Payer: Self-pay

## 2019-07-04 VITALS — BP 122/64 | HR 90 | Temp 98.8°F | Resp 16 | Ht 77.0 in | Wt 191.0 lb

## 2019-07-04 DIAGNOSIS — L0390 Cellulitis, unspecified: Secondary | ICD-10-CM | POA: Diagnosis not present

## 2019-07-04 MED ORDER — DOXYCYCLINE MONOHYDRATE 100 MG PO TABS: 100.0000 mg | ORAL_TABLET | Freq: Two times a day (BID) | ORAL | 0 refills | Status: DC

## 2019-07-04 NOTE — Telephone Encounter (Signed)
Pt sched. For 12:30

## 2019-07-04 NOTE — Assessment & Plan Note (Signed)
Doxycycline has improved his cellulitis significantly, he still has one small area, that is present, will give another 7 days of antibiotic, continue warm compresses I do not see an area to open up and drain today.  Also think with the significant improvement we have already had he does not need repeat imaging at this time.  We will give him a note for work as well

## 2019-07-04 NOTE — Telephone Encounter (Signed)
Received call from patient. States that cellulitis has improved, but he continues to have small area that is hard in the fold of groin. Area is not red nor warm to touch. No drainage noted.   Patient completed ABTx on 07/04/2019.

## 2019-07-04 NOTE — Telephone Encounter (Signed)
   Get pt in today at  12:30pm for Recheck on cellulitis

## 2019-07-04 NOTE — Progress Notes (Signed)
   Subjective:    Patient ID: Warren Ross, male    DOB: 08-06-1985, 34 y.o.   MRN: [id number]  Patient presents for Follow-up (Cellulitis)  Patient here for interim follow-up.  He was seen a week ago secondary to cellulitis of the scrotum he was hospitalized for this.  I extended his doxycycline to a total of 10 days.  He was supposed to return to work today however he still has an area of induration in the left groin region.  I am reevaluating this today.  He has not had any fever or drainage the pain has improved.  He still however has a hard spot in the left inguinal region that tracks down a little towards the scrotum.  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     Review Of Systems:  GEN- denies fatigue, fever, weight loss,weakness, recent illness HEENT- denies eye drainage, change in vision, nasal discharge, CVS- denies chest pain, palpitations RESP- denies SOB, cough, wheeze ABD- denies N/V, change in stools, abd pain GU- denies dysuria, hematuria, dribbling, incontinence MSK- denies joint pain, muscle aches, injury Neuro- denies headache, dizziness, syncope, seizure activity       Objective:    BP 122/64   Pulse 90   Temp 98.8 F (37.1 C) (Temporal)   Resp 16   Ht 6\' 5"  (1.956 m)   Wt 191 lb (86.6 kg)   SpO2 98%   BMI 22.65 kg/m  GEN- NAD, alert and oriented x3 CVS- RRR, no murmur RESP-CTAB ABD-NABS,soft,NT,ND GU  Minimal scrotal swelling with small pocket of  induration tracking up the left inguinal region , mild TTP, no fluctuance, no pustule  Pulses- Radial2+         Assessment & Plan:      Problem List Items Addressed This Visit      Unprioritized   Cellulitis - Primary    Doxycycline has improved his cellulitis significantly, he  still has one small area, that is present, will give another 7 days of antibiotic, continue warm compresses I do not see an area to open up and drain today.  Also think with the significant improvement we have already had he does not need repeat imaging at this time.  We will give him a note for work as well          Note: This dictation was prepared with dictation along with smaller Nurse, children's. Any transcriptional errors that result from this process are unintentional.

## 2019-07-04 NOTE — Telephone Encounter (Signed)
Pt states that most of the swelling has went down but no all of it. Pt would like to know what to do now and about work.

## 2019-07-04 NOTE — Patient Instructions (Signed)
Antibiotics extended another week F/U as needed

## 2019-07-04 NOTE — Telephone Encounter (Signed)
This encounter was created in error - please disregard.

## 2019-09-30 ENCOUNTER — Encounter: Payer: 59 | Admitting: Family Medicine

## 2020-01-29 ENCOUNTER — Other Ambulatory Visit: Payer: Self-pay | Admitting: Family Medicine

## 2020-05-02 ENCOUNTER — Ambulatory Visit (INDEPENDENT_AMBULATORY_CARE_PROVIDER_SITE_OTHER): Payer: 59 | Admitting: Nurse Practitioner

## 2020-05-02 ENCOUNTER — Other Ambulatory Visit: Payer: Self-pay

## 2020-05-02 ENCOUNTER — Encounter: Payer: Self-pay | Admitting: Nurse Practitioner

## 2020-05-02 VITALS — HR 127 | Temp 99.8°F

## 2020-05-02 DIAGNOSIS — B349 Viral infection, unspecified: Secondary | ICD-10-CM

## 2020-05-02 DIAGNOSIS — Z1152 Encounter for screening for COVID-19: Secondary | ICD-10-CM

## 2020-05-02 HISTORY — DX: Viral infection, unspecified: B34.9

## 2020-05-02 MED ORDER — ONDANSETRON 4 MG PO TBDP: 4.0000 mg | ORAL_TABLET | Freq: Three times a day (TID) | ORAL | 0 refills | Status: DC | PRN

## 2020-05-02 NOTE — Patient Instructions (Signed)
Preventing Foodborne Illness Foodborne illness, also called food poisoning, is an illness caused by eating food or drinking liquid that is contaminated with harmful bacteria, viruses, parasites, or poisons (toxins). Bacteria and viruses cause most foodborne illnesses. Often times, foodborne illnesses go away in a few days without treatment. However, if you have a severe case, treatment may be needed. How can this condition affect me? Common symptoms of foodborne illness are sudden nausea, vomiting, abdominal pain, diarrhea, and fever. Most cases of foodborne illness can be treated at home. You should:  Drink enough fluid to keep your urine pale yellow.  Avoid foods and drinks that are hard to digest or that irritate your digestive system. These include fatty foods, dairy foods, caffeine, sugary foods, and alcohol.  Gradually start eating foods that are easy on your digestive system. These include rice, potatoes, toast, applesauce, broth, and bananas. Foodborne illnesses range in severity. They can cause unpleasant symptoms to dangerous complications that can be deadly, such as dehydration and bleeding in your digestive system. Severe cases of foodborne illness may need to be treated with IV fluids and other medicines. Antibiotic medicines may be given for severe illness caused by bacteria. What can increase my risk? People at higher risk for serious foodborne illness include:  Children.  Older adults.  Pregnant women.  People with weak immune systems. What actions can I take to prevent this?  Eat safe foods  Check foods before buying them. Do not buy jars or cans that are dented, cracked, or have bulging or loose lids. Do not buy foods if the packaging is open, torn, or crushed.  Take extra care with foods that can spoil (perishable foods) like meat and dairy products. Germs can start to grow in these foods whether they are raw, cooked, or prepared. Make sure you: ? Check labels to see  if the food needs to be refrigerated. ? Follow the dates on the package to use or freeze the product by. ? Freeze or refrigerate all perishable foods within two hours. In the summer, freeze or refrigerate perishable foods within one hour. Keep your refrigerator set to 57F (4.4C) or colder. Prepare food properly  Check the cooking instructions for all foods. Heating kills many germs and toxins. Use a meat thermometer to make sure you have cooked meats to the right temperature to make them safe. ? Cook whole cuts of beef, pork, veal, and lamb to no less than 145F (62.8C). ? Cook [employer], including beef or pork, to no less than 160F (71.1C). ? Cook Sales executive, including chicken or [place], to no less than 165F (73.9C). ? Cook fin fish to no less than 145F (62.8C). ? Cook leftovers and dishes such as casseroles to no less than 165F (73.9C).  Wash fruits and vegetables under running water before you eat, cut, or cook them. If you are preparing fruits and vegetables in a place where running water is not safe, use bottled water to clean fruit and vegetables or remove peels before eating.  Keep raw meats, poultry, fish, shellfish, and eggs separate from other foods. Germs or toxins can pass from one food to another. ? Put these foods in separate plastic bags when shopping. ? Use separate utensils and cutting boards when preparing these foods.  When thawing foods, it is best to thaw them slowly in the refrigerator. You may also thaw foods in cold water or in the microwave if you cook them immediately after they are thawed. Take other precautions  If you  will not eat the food right after making it, keep hot food at 160F (60C) or warmer and cold foods at 60F (4.4C) or colder.  Do not drink unpasteurized milk or juice. What other changes can I make?      Wash your hands for at least 20 seconds with soap and warm water before and after handling food. Always wash your hands after using  the bathroom or changing a diaper.  Wash all the surfaces where food is prepared with hot, soapy water. Wash all utensils, plates, cutting boards, pots, and pans. Do this before and after food preparation.  Keep animals away from surfaces where food is prepared.  If you travel to an area where foodborne illness is common: ? Do not drink tap water, use ice from tap water, or brush your teeth with tap water. Use only bottled water. ? Do not eat raw fruits or vegetables, unless you wash and peel them yourself. ? Do not eat foods sold by a street vendor. ? Do not eat rare meat, uncooked fish, or shellfish. ? Talk to your health care provider about bringing medicine with you to treat possible foodborne illness (traveler's diarrhea). Where can I get more information? Learn more about foodborne illness from:  Centers for Disease Control and Prevention: WirelessImprov.gl.html  [employer] of [employer]: http://caldwell-sandoval.com/.aspx  http://stevens-collins.org/: https://www.WordRegistrar.at Contact a health care provider if:  You have persistent vomiting and cannot keep any fluids down.  You have diarrhea for more than two days.  You have severe abdominal pain.  You have bloody or tarry stools. Summary  Foodborne illness is caused by bacteria, viruses, parasites, or poisons. Symptoms include sudden nausea, vomiting, abdominal pain, diarrhea, and fever.  You can prevent foodborne illness by carefully preparing, storing, and cooking your food.  Make sure to wash your hands and all food preparation surfaces and utensils before and after handling food.  Avoid tap water and uncooked foods when traveling to areas where foodborne illness is common.  Call your health care provider if you have severe symptoms of foodborne illness, or if you have common symptoms that last more than a  few days. This information is not intended to replace advice given to you by your health care provider. Make sure you discuss any questions you have with your health care provider. Document Revised: 01/12/2019 Document Reviewed: 02/02/2018 Elsevier Patient Education  2020 [employer].

## 2020-05-02 NOTE — Assessment & Plan Note (Signed)
Acute, ongoing x 1 day.  With vomiting and fever after eating fast food, likely viral gastroenteritis.  Tachycardia likely secondary to low grade fever/dehydration.  COVID swab obtained to rule out.  Encouraged plenty of hydration with electrolyte solution and eating easy-to-digest foods for the next 48 hours.  Ondansetron given if nausea/vomiting returns.  If symptoms persist >1 week, return to clinic.

## 2020-05-02 NOTE — Progress Notes (Signed)
Subjective:    Patient ID: Warren Ross, male    DOB: [birth year], 34 y.o.   MRN: [id number]  HPI: Warren Ross is a 34 y.o. male presenting via parking lot visit due to COVID-19 pandemic for upper respiratory tract infection.  Chief Complaint  Patient presents with  . Headache  . Emesis   UPPER RESPIRATORY TRACT INFECTION Onset: yesterday after eating [employer] for dinner Worst symptom: headache Fever: yes Cough: no Shortness of breath: no Wheezing: no Chest pain: yes, with cough Chest tightness: no Chest congestion: yes Nasal congestion: yes Runny nose: no Post nasal drip: no Sneezing: no Sore throat: no Swollen glands: no Sinus pressure: yes Headache: yes Face pain: no Toothache: no Ear pain: no  Ear pressure: no  Eyes red/itching:no Eye drainage/crusting: no  Nausea: yes Vomiting: yes Diarrhea: no Change in appetite: yes; decreased but tolerating liquids well Rash: no Fatigue: no Sick contacts: no Strep contacts: no  Context: better Recurrent sinusitis: no Treatments attempted: nothing  Relief with OTC medications: nothing tried  No Known Allergies  Outpatient Encounter Medications as of 05/02/2020  Medication Sig  . ondansetron (ZOFRAN-ODT) 4 MG disintegrating tablet Take 1 tablet (4 mg total) by mouth every 8 (eight) hours as needed for nausea or vomiting.  Marland Kitchen amLODipine (NORVASC) 10 MG tablet TAKE 1 TABLET BY MOUTH EVERY DAY  . hydrochlorothiazide (HYDRODIURIL) 12.5 MG tablet Take 1 tablet (12.5 mg total) by mouth daily. (Patient not taking: Reported on 06/28/2019)  . [DISCONTINUED] doxycycline (ADOXA) 100 MG tablet Take 1 tablet (100 mg total) by mouth 2 (two) times daily.   No facility-administered encounter medications on file as of 05/02/2020.    Patient Active Problem List   Diagnosis Date Noted  . Viral illness 05/02/2020  . Cellulitis 06/21/2019  . Hypokalemia 06/21/2019  . Alcohol use 06/21/2019  . Right carpal tunnel  syndrome 01/26/2019  . GAD (generalized anxiety disorder) 12/03/2018  . Hyperlipidemia 12/03/2018  . Tobacco use 01/16/2017  . Essential hypertension 01/16/2017    Past Medical History:  Diagnosis Date  . Hypertension   . Right carpal tunnel syndrome 01/26/2019    Relevant past medical, surgical, family and social history reviewed and updated as indicated. Interim medical history since our last visit reviewed.  Review of Systems  Constitutional: Positive for appetite change and fever. Negative for activity change and fatigue.  HENT: Positive for congestion and sinus pain. Negative for ear discharge, ear pain, postnasal drip, rhinorrhea, sinus pressure and sore throat.   Eyes: Negative.   Respiratory: Negative.  Negative for cough, shortness of breath and wheezing.   Cardiovascular: Negative.  Negative for chest pain.  Gastrointestinal: Positive for nausea and vomiting. Negative for abdominal pain, constipation and diarrhea.  Musculoskeletal: Negative.   Skin: Negative.   Neurological: Positive for headaches.  Psychiatric/Behavioral: Negative.     Per HPI unless specifically indicated above     Objective:    Pulse (!) 127   Temp 99.8 F (37.7 C) (Temporal)   SpO2 97%   Wt Readings from Last 3 Encounters:  07/04/19 191 lb (86.6 kg)  06/28/19 191 lb (86.6 kg)  06/21/19 190 lb (86.2 kg)    Physical Exam Vitals and nursing note reviewed.  Constitutional:      General: He is not in acute distress.    Appearance: He is well-developed. He is not toxic-appearing.  HENT:     Head: Normocephalic and atraumatic.     Right Ear: External ear normal.  Left Ear: External ear normal.     Nose: Nose normal. No congestion or rhinorrhea.     Mouth/Throat:     Mouth: Mucous membranes are moist.     Pharynx: Oropharynx is clear. Posterior oropharyngeal erythema present.  Eyes:     General: No scleral icterus.       Right eye: No discharge.        Left eye: No discharge.      Extraocular Movements: Extraocular movements intact.  Cardiovascular:     Rate and Rhythm: Tachycardia present.  Pulmonary:     Effort: Pulmonary effort is normal. No respiratory distress.  Abdominal:     General: Abdomen is flat. There is no distension.     Palpations: Abdomen is soft.     Tenderness: There is no abdominal tenderness.  Skin:    General: Skin is warm and dry.     Capillary Refill: Capillary refill takes less than 2 seconds.     Coloration: Skin is not jaundiced or pale.     Findings: No erythema.  Neurological:     Mental Status: He is alert and oriented to person, place, and time.  Psychiatric:        Mood and Affect: Mood normal.        Behavior: Behavior normal.        Thought Content: Thought content normal.        Judgment: Judgment normal.       Assessment & Plan:   Problem List Items Addressed This Visit      Other   Viral illness    Acute, ongoing x 1 day.  With vomiting and fever after eating fast food, likely viral gastroenteritis.  Tachycardia likely secondary to low grade fever/dehydration.  COVID swab obtained to rule out.  Encouraged plenty of hydration with electrolyte solution and eating easy-to-digest foods for the next 48 hours.  Ondansetron given if nausea/vomiting returns.  If symptoms persist >1 week, return to clinic.      Relevant Medications   ondansetron (ZOFRAN-ODT) 4 MG disintegrating tablet    Other Visit Diagnoses    Encounter for screening for COVID-19    -  Primary   Relevant Orders   SARS-COV-2 RNA,(COVID-19) QUAL NAAT       Follow up plan: Return if symptoms worsen or fail to improve.

## 2020-05-04 LAB — SARS-COV-2 RNA,(COVID-19) QUALITATIVE NAAT: SARS CoV2 RNA: DETECTED — AB

## 2020-12-06 ENCOUNTER — Telehealth: Payer: Self-pay

## 2020-12-06 MED ORDER — AMLODIPINE BESYLATE 10 MG PO TABS: 10.0000 mg | ORAL_TABLET | Freq: Every day | ORAL | 0 refills | Status: DC

## 2020-12-06 NOTE — Telephone Encounter (Signed)
Prescription sent to pharmacy.

## 2020-12-06 NOTE — Telephone Encounter (Signed)
Pt called in to check on status of refill of amLODipine (NORVASC) 10 MG. Pt states that pharmacy has faxed to office a request for refill.  Cb#: [phone]

## 2020-12-28 ENCOUNTER — Other Ambulatory Visit: Payer: Self-pay | Admitting: Family Medicine

## 2021-01-24 ENCOUNTER — Ambulatory Visit (INDEPENDENT_AMBULATORY_CARE_PROVIDER_SITE_OTHER): Payer: 59 | Admitting: Nurse Practitioner

## 2021-01-24 ENCOUNTER — Encounter: Payer: Self-pay | Admitting: Nurse Practitioner

## 2021-01-24 ENCOUNTER — Other Ambulatory Visit: Payer: Self-pay

## 2021-01-24 VITALS — BP 140/98 | HR 82 | Ht 77.0 in | Wt 191.0 lb

## 2021-01-24 DIAGNOSIS — Z1159 Encounter for screening for other viral diseases: Secondary | ICD-10-CM | POA: Diagnosis not present

## 2021-01-24 DIAGNOSIS — I10 Essential (primary) hypertension: Secondary | ICD-10-CM

## 2021-01-24 DIAGNOSIS — Z720 Tobacco use: Secondary | ICD-10-CM | POA: Diagnosis not present

## 2021-01-24 MED ORDER — AMLODIPINE BESYLATE 10 MG PO TABS: 10.0000 mg | ORAL_TABLET | Freq: Every day | ORAL | 1 refills | Status: DC

## 2021-01-24 MED ORDER — AMLODIPINE BESYLATE 10 MG PO TABS: 10.0000 mg | ORAL_TABLET | Freq: Every day | ORAL | 0 refills | Status: DC

## 2021-01-24 NOTE — Assessment & Plan Note (Signed)
Chronic.  BP is elevated above goal of less than 130/80 today in clinic, however patient has been without amlodipine for about 3 weeks.  Will resume amlodipine 10 mg daily and I have asked the patient to use his father's blood pressure cuff to check his blood pressure a few times weekly and report the readings to me after 2 weeks.  He is in agreement to this plan.  We will check electrolytes, kidney function, and liver enzymes today along with a complete blood count.  Follow up in 6 months.

## 2021-01-24 NOTE — Progress Notes (Signed)
Subjective:    Patient ID: Warren Ross, male    DOB: 06/09/1985, 35 y.o.   MRN: [id number]  HPI: Warren Ross is a 35 y.o. male presenting for medication refill.  Chief Complaint  Patient presents with   Hypertension   HYPERTENSION Was taking amlodipine 10 mg daily.  Does not check blood pressure at home. Hypertension status: uncontrolled. Medication compliance: excellent Aspirin: no Recurrent headaches: no Visual changes: no Palpitations: no Dyspnea: no Chest pain: no Lower extremity edema: no Dizzy/lightheaded: no  TOBACCO USER Smoking Status: current smoker Smoking Amount: 1-2 PPD Smoking Quit Date: Not interested in quitting smoking Smoking triggers: enjoyment habit Type of tobacco use: cigarettes, got vape recently  No Known Allergies  Outpatient Encounter Medications as of 01/24/2021  Medication Sig   [DISCONTINUED] amLODipine (NORVASC) 10 MG tablet Take 1 tablet (10 mg total) by mouth daily.   amLODipine (NORVASC) 10 MG tablet Take 1 tablet (10 mg total) by mouth daily.   [DISCONTINUED] amLODipine (NORVASC) 10 MG tablet Take 1 tablet (10 mg total) by mouth daily.   [DISCONTINUED] hydrochlorothiazide (HYDRODIURIL) 12.5 MG tablet Take 1 tablet (12.5 mg total) by mouth daily. (Patient not taking: No sig reported)   [DISCONTINUED] ondansetron (ZOFRAN-ODT) 4 MG disintegrating tablet Take 1 tablet (4 mg total) by mouth every 8 (eight) hours as needed for nausea or vomiting. (Patient not taking: Reported on 01/24/2021)   No facility-administered encounter medications on file as of 01/24/2021.    Patient Active Problem List   Diagnosis Date Noted   Viral illness 05/02/2020   Cellulitis 06/21/2019   Hypokalemia 06/21/2019   Alcohol use 06/21/2019   Right carpal tunnel syndrome 01/26/2019   GAD (generalized anxiety disorder) 12/03/2018   Hyperlipidemia 12/03/2018   Tobacco use 01/16/2017   Essential hypertension 01/16/2017    Past Medical History:   Diagnosis Date   Hypertension    Right carpal tunnel syndrome 01/26/2019    Relevant past medical, surgical, family and social history reviewed and updated as indicated. Interim medical history since our last visit reviewed.  Review of Systems Per HPI unless specifically indicated above     Objective:    BP (!) 140/98   Pulse 82   Ht 6\' 5"  (1.956 m)   Wt 191 lb (86.6 kg)   SpO2 97%   BMI 22.65 kg/m   Wt Readings from Last 3 Encounters:  01/24/21 191 lb (86.6 kg)  07/04/19 191 lb (86.6 kg)  06/28/19 191 lb (86.6 kg)    Physical Exam Vitals and nursing note reviewed.  Constitutional:      General: He is not in acute distress.    Appearance: Normal appearance. He is not toxic-appearing.  Eyes:     General: No scleral icterus.    Extraocular Movements: Extraocular movements intact.  Neck:     Vascular: No carotid bruit.  Cardiovascular:     Rate and Rhythm: Normal rate and regular rhythm.     Heart sounds: Normal heart sounds. No murmur heard. Pulmonary:     Effort: Pulmonary effort is normal. No respiratory distress.     Breath sounds: Normal breath sounds. No wheezing, rhonchi or rales.  Musculoskeletal:     Cervical back: Normal range of motion.  Skin:    General: Skin is warm and dry.     Coloration: Skin is not jaundiced or pale.     Findings: No erythema.  Neurological:     Mental Status: He is alert and oriented to  person, place, and time.     Motor: No weakness.     Gait: Gait normal.  Psychiatric:        Mood and Affect: Mood normal.        Behavior: Behavior normal.        Thought Content: Thought content normal.        Judgment: Judgment normal.      Assessment & Plan:   Problem List Items Addressed This Visit       Cardiovascular and Mediastinum   Essential hypertension    Chronic.  BP is elevated above goal of less than 130/80 today in clinic, however patient has been without amlodipine for about 3 weeks.  Will resume amlodipine 10 mg daily  and I have asked the patient to use his father's blood pressure cuff to check his blood pressure a few times weekly and report the readings to me after 2 weeks.  He is in agreement to this plan.  We will check electrolytes, kidney function, and liver enzymes today along with a complete blood count.  Follow up in 6 months.       Relevant Medications   amLODipine (NORVASC) 10 MG tablet   Other Relevant Orders   COMPLETE METABOLIC PANEL WITH GFR   Lipid Panel   CBC with Differential     Other   Tobacco use - Primary    Chronic.  Discussed complete cessation from cigarettes, however patient is in precontemplative phase of quitting smoking and is not interested today.  I did recommend he cut back to avoid developing COPD and lung cancer in the future.      Relevant Orders   Lipid Panel   Other Visit Diagnoses     Need for hepatitis C screening test       Relevant Orders   Hepatitis C antibody        Follow up plan: Return for pending lab work.

## 2021-01-24 NOTE — Assessment & Plan Note (Addendum)
Chronic.  Discussed complete cessation from cigarettes, however patient is in precontemplative phase of quitting smoking and is not interested today.  I did recommend he cut back to avoid developing COPD and lung cancer in the future.

## 2021-01-25 ENCOUNTER — Encounter: Payer: Self-pay | Admitting: *Deleted

## 2021-01-25 LAB — HEPATITIS C ANTIBODY
Hepatitis C Ab: NONREACTIVE
SIGNAL TO CUT-OFF: 0.01 (ref <1.00)

## 2021-01-25 LAB — CBC WITH DIFFERENTIAL/PLATELET
Absolute Monocytes: 1015 cells/uL — ABNORMAL HIGH (ref 200–950)
Basophils Absolute: 79 cells/uL (ref 0–200)
Basophils Relative: 1.1 %
Eosinophils Absolute: 130 cells/uL (ref 15–500)
Eosinophils Relative: 1.8 %
HCT: 45.9 % (ref 38.5–50.0)
Hemoglobin: 15.6 g/dL (ref 13.2–17.1)
Lymphs Abs: 2686 cells/uL (ref 850–3900)
MCH: 31.7 pg (ref 27.0–33.0)
MCHC: 34 g/dL (ref 32.0–36.0)
MCV: 93.3 fL (ref 80.0–100.0)
MPV: 10.5 fL (ref 7.5–12.5)
Monocytes Relative: 14.1 %
Neutro Abs: 3290 cells/uL (ref 1500–7800)
Neutrophils Relative %: 45.7 %
Platelets: 251 10*3/uL (ref 140–400)
RBC: 4.92 10*6/uL (ref 4.20–5.80)
RDW: 12.4 % (ref 11.0–15.0)
Total Lymphocyte: 37.3 %
WBC: 7.2 10*3/uL (ref 3.8–10.8)

## 2021-01-25 LAB — COMPLETE METABOLIC PANEL WITH GFR
AG Ratio: 1.9 (calc) (ref 1.0–2.5)
ALT: 13 U/L (ref 9–46)
AST: 20 U/L (ref 10–40)
Albumin: 4.3 g/dL (ref 3.6–5.1)
Alkaline phosphatase (APISO): 65 U/L (ref 36–130)
BUN: 9 mg/dL (ref 7–25)
CO2: 26 mmol/L (ref 20–32)
Calcium: 9.8 mg/dL (ref 8.6–10.3)
Chloride: 102 mmol/L (ref 98–110)
Creat: 1.01 mg/dL (ref 0.60–1.26)
Globulin: 2.3 g/dL (calc) (ref 1.9–3.7)
Glucose, Bld: 92 mg/dL (ref 65–99)
Potassium: 4.5 mmol/L (ref 3.5–5.3)
Sodium: 136 mmol/L (ref 135–146)
Total Bilirubin: 0.5 mg/dL (ref 0.2–1.2)
Total Protein: 6.6 g/dL (ref 6.1–8.1)
eGFR: 98 mL/min/{1.73_m2} (ref >=60)

## 2021-01-25 LAB — LIPID PANEL
Cholesterol: 160 mg/dL (ref <200)
HDL: 56 mg/dL (ref >=40)
LDL Cholesterol (Calc): 78 mg/dL (calc)
Non-HDL Cholesterol (Calc): 104 mg/dL (calc) (ref <130)
Total CHOL/HDL Ratio: 2.9 (calc) (ref <5.0)
Triglycerides: 160 mg/dL — ABNORMAL HIGH (ref <150)

## 2021-05-14 DIAGNOSIS — R0781 Pleurodynia: Secondary | ICD-10-CM | POA: Diagnosis not present

## 2021-07-29 IMAGING — CT CT PELVIS W/ CM
2 of 3 series · 16 of 46 positions shown, 18 images · IV contrast (Omni 300)
Comparison: None.

CLINICAL DATA: Scrotal swelling or edema. Swelling and induration
with redness of the scrotum. Concern for abscess involving the
perineum.

EXAM:
CT PELVIS WITH CONTRAST
TECHNIQUE: Multidetector CT imaging of the pelvis was performed using the
standard protocol following the bolus administration of intravenous
contrast.
CONTRAST:  100mL OMNIPAQUE IOHEXOL 300 MG/ML  SOLN

[Series 3: pelvis with 5.0 · axial · 0.84mm/px · z∈[+559,+869]mm · 13 of 72 slices shown, 15 images]
[im 5/72  soft-tissue]
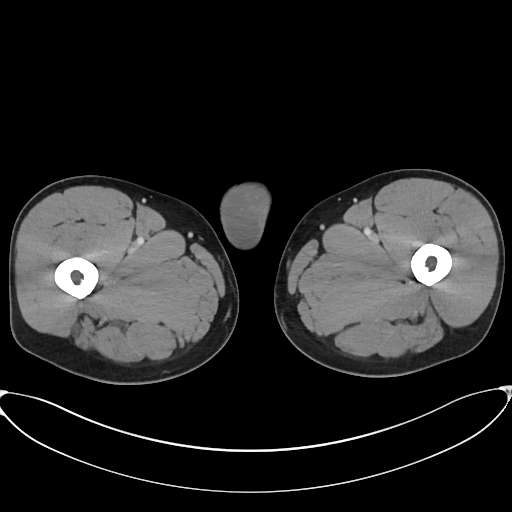
[im 5/72  bone]
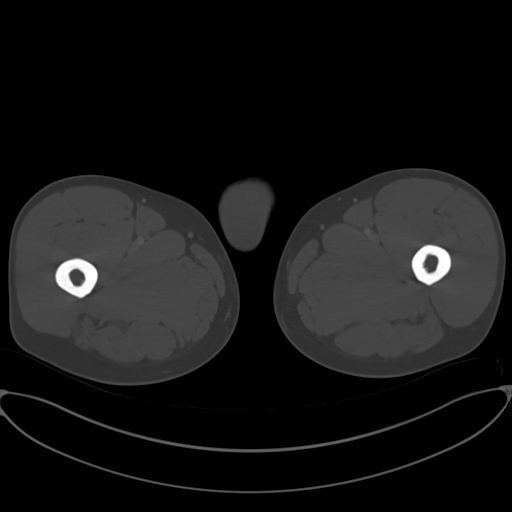
[im 10/72  soft-tissue]
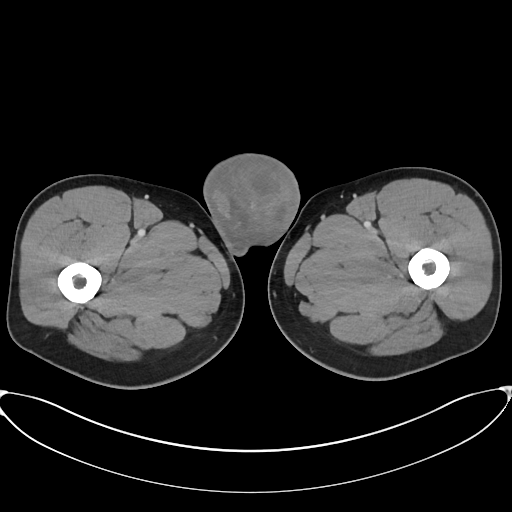
[im 14/72  soft-tissue]
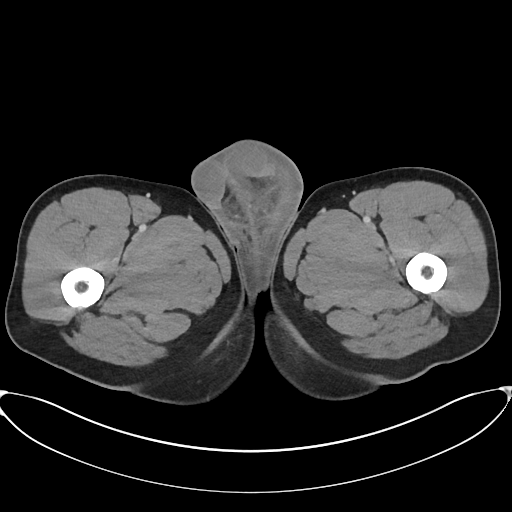
[im 21/72  soft-tissue]
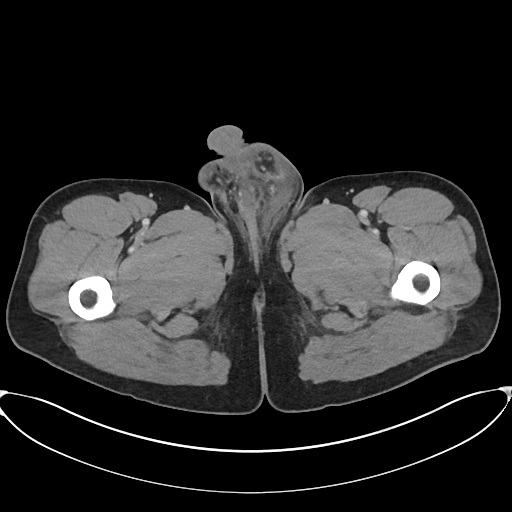
[im 26/72  soft-tissue]
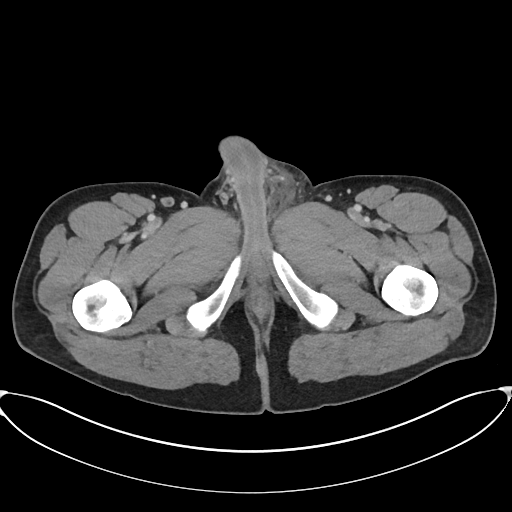
[im 30/72  soft-tissue]
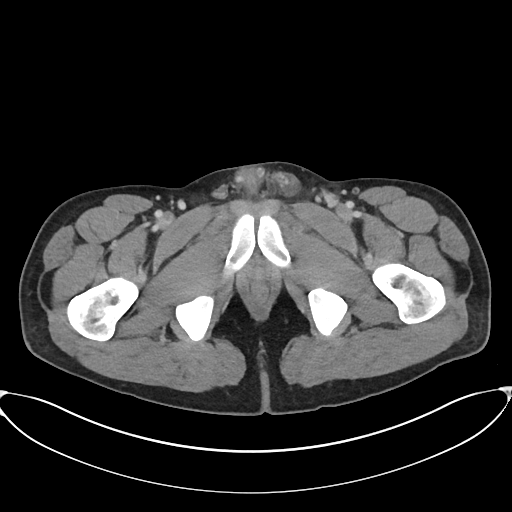
[im 37/72  soft-tissue]
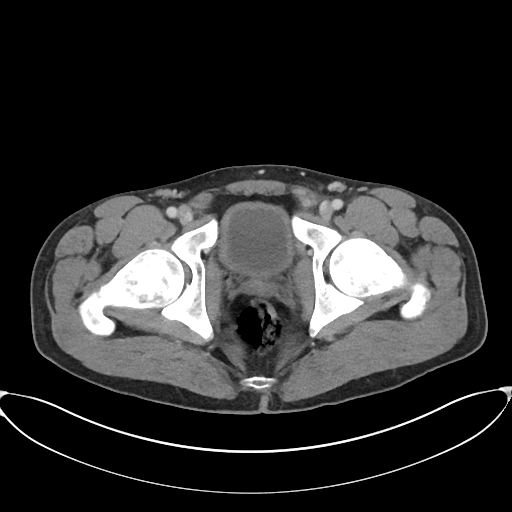
[im 42/72  soft-tissue]
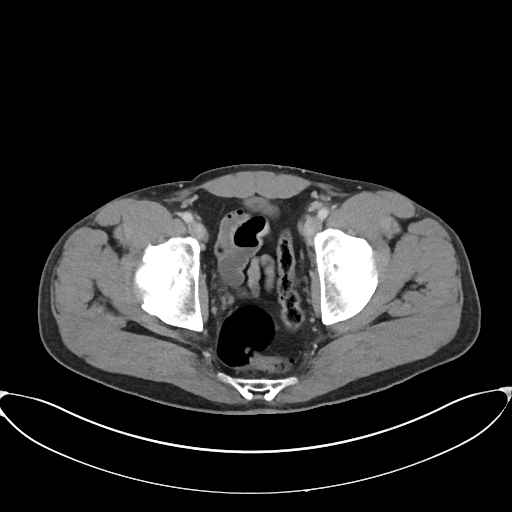
[im 46/72  soft-tissue]
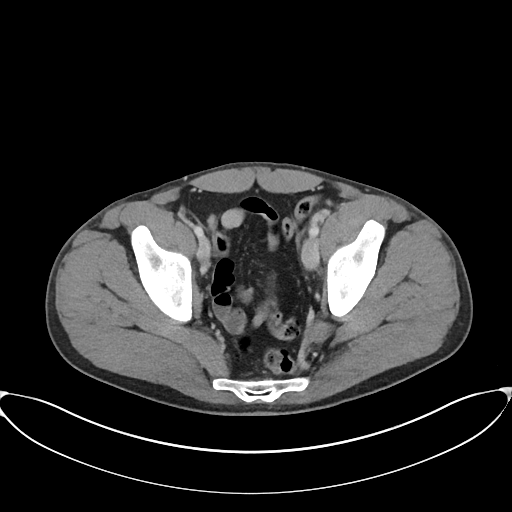
[im 46/72  bone]
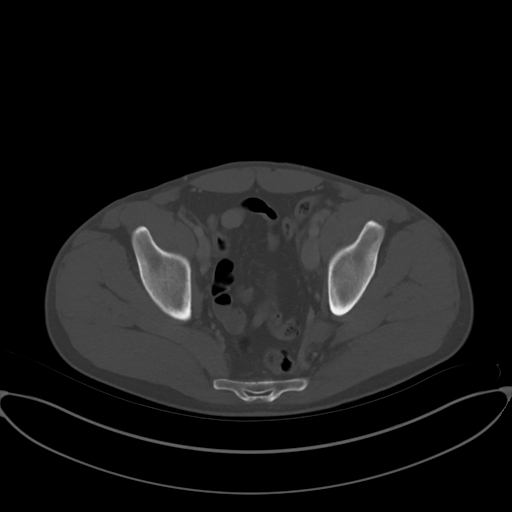
[im 51/72  soft-tissue]
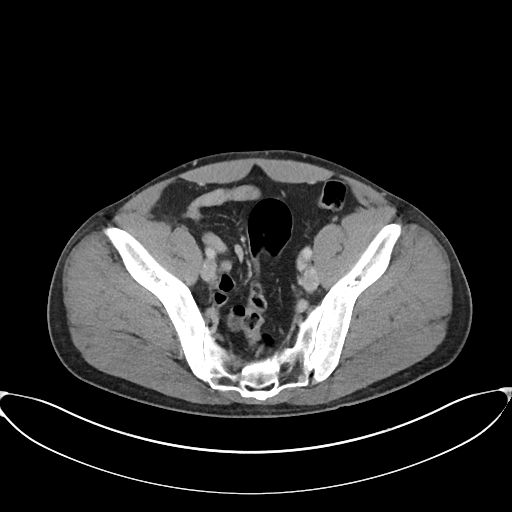
[im 58/72  soft-tissue]
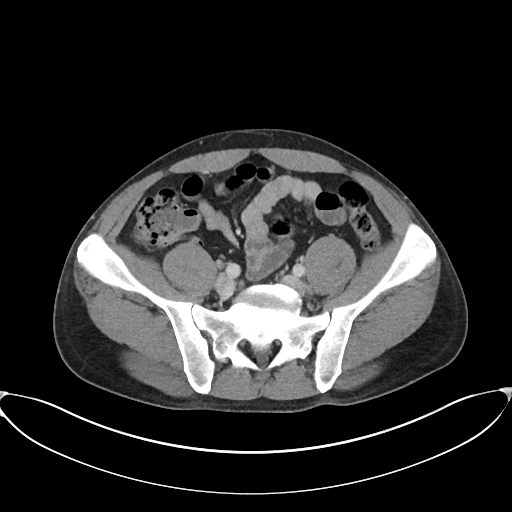
[im 62/72  soft-tissue]
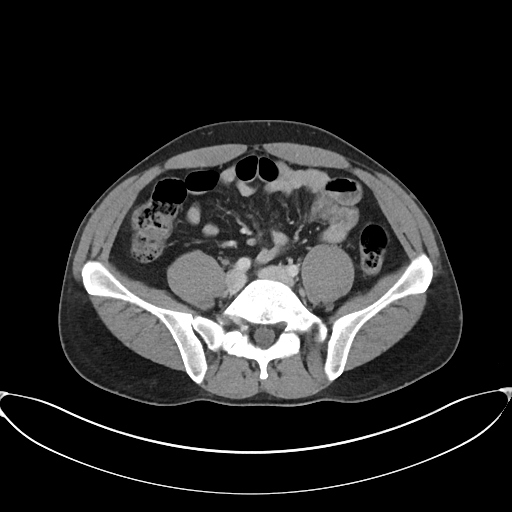
[im 67/72  soft-tissue]
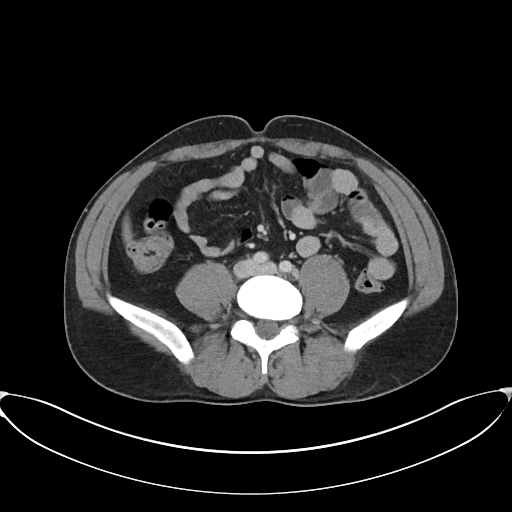

[Series 5: pelvis with 2.0 cor · coronal · 0.71mm/px · 3 of 129 slices shown]
[im 43/129  soft-tissue]
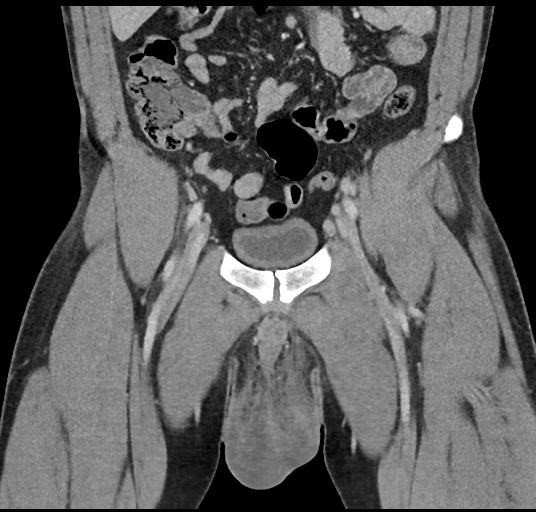
[im 57/129  soft-tissue]
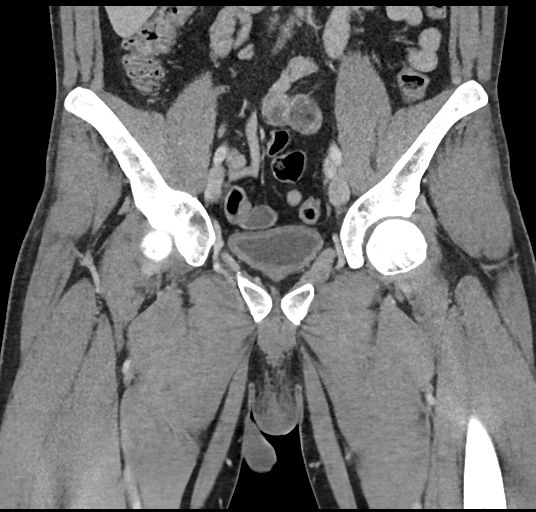
[im 72/129  soft-tissue]
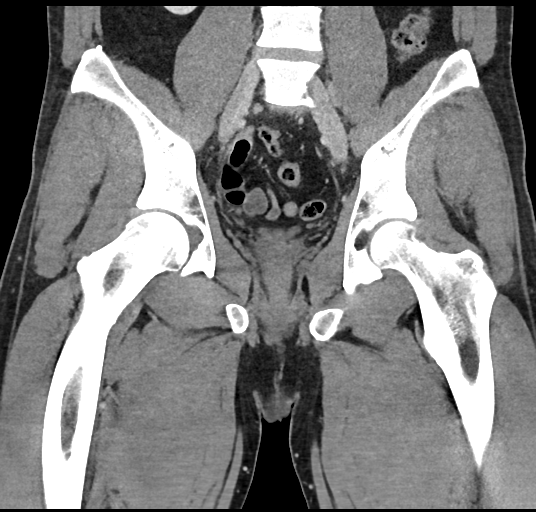

[16 of 46 positions shown; findings below may reference images not displayed]

FINDINGS: Urinary Tract: Distal ureters are decompressed. Urinary bladder is
partially distended. There is bladder wall thickening, which may be
related to nondistention.

Bowel: Pelvic bowel loops are unremarkable. No evidence of
inflammation. Appendix is included and normal. No perirectal fluid
collection.

Vascular/Lymphatic: Questionable hyperemia of left gonadal veins. No
other acute vascular finding. There are prominent bilateral common,
external iliac and inguinal nodes that are likely reactive.

Reproductive: Diffuse bilateral scrotal skin thickening with soft
tissue edema. No peripherally enhancing or organized collection.
There is no soft tissue gas. Inflammatory changes in the left tract
into the inguinal canal. Questionable hyperemia of the left gonadal
veins. Inflammatory changes track minimally to the perineum.

Other:  No pelvic free fluid. No intrapelvic fluid collection.

Musculoskeletal: Bilateral L5 pars interarticularis defects with
trace anterolisthesis of L5 on S1. There are no acute or suspicious
osseous abnormalities. No evidence of intramuscular fluid
collection.
IMPRESSION: 1. Diffuse bilateral scrotal skin thickening and soft tissue edema
consistent with cellulitis. Soft tissue edema is confluent, however
no peripherally enhancing or organized collection. No discrete
abscess or soft tissue gas.
2. Inflammatory changes track into the left inguinal canal.
Questionable hyperemia of the left gonadal veins.
3. Bladder wall thickening versus nondistention.
4. Bilateral L5 pars interarticularis defects with trace
anterolisthesis of L5 on S1.

## 2021-09-11 ENCOUNTER — Other Ambulatory Visit: Payer: Self-pay | Admitting: Nurse Practitioner

## 2021-09-11 DIAGNOSIS — I10 Essential (primary) hypertension: Secondary | ICD-10-CM

## 2021-09-12 NOTE — Telephone Encounter (Signed)
Norvasc 10 mg refused because provider Cathlean Marseilles) no longer at [facility]. ?

## 2021-10-06 ENCOUNTER — Other Ambulatory Visit: Payer: Self-pay | Admitting: Nurse Practitioner

## 2021-10-06 DIAGNOSIS — I10 Essential (primary) hypertension: Secondary | ICD-10-CM

## 2022-01-16 DIAGNOSIS — J029 Acute pharyngitis, unspecified: Secondary | ICD-10-CM | POA: Diagnosis not present

## 2022-01-29 ENCOUNTER — Ambulatory Visit (INDEPENDENT_AMBULATORY_CARE_PROVIDER_SITE_OTHER): Payer: BC Managed Care – PPO | Admitting: Internal Medicine

## 2022-01-29 ENCOUNTER — Encounter: Payer: Self-pay | Admitting: Internal Medicine

## 2022-01-29 DIAGNOSIS — I10 Essential (primary) hypertension: Secondary | ICD-10-CM | POA: Diagnosis not present

## 2022-01-29 DIAGNOSIS — G5601 Carpal tunnel syndrome, right upper limb: Secondary | ICD-10-CM

## 2022-01-29 DIAGNOSIS — Z789 Other specified health status: Secondary | ICD-10-CM

## 2022-01-29 DIAGNOSIS — F411 Generalized anxiety disorder: Secondary | ICD-10-CM

## 2022-01-29 DIAGNOSIS — E781 Pure hyperglyceridemia: Secondary | ICD-10-CM | POA: Diagnosis not present

## 2022-01-29 DIAGNOSIS — Z720 Tobacco use: Secondary | ICD-10-CM

## 2022-01-29 DIAGNOSIS — E876 Hypokalemia: Secondary | ICD-10-CM

## 2022-01-29 NOTE — Patient Instructions (Signed)
It was a pleasure seeing you today!  Today the plan is...  Hold off on blood work until he does his annual preventive exam as he is not requiring much medication for monitoring anyway  Essential hypertension Assessment & Plan: Blood pressure is uncontrolled today but he has been out of his medication so we will just refill that for today and I encouraged him to measure his blood pressure at home and abstain from alcohol and nonsteroidal anti-inflammatory drugs and salt all of which can raise the blood pressure   Tobacco use Assessment & Plan: Long-term smoking of 1.5 pack/day I asked if he had any interest in quitting and he said it is 0 of 10 so I did not do any further tobacco use cessation counseling today although I did make him aware that I think it is best for his health if he gets motivated I will be here to help   GAD (generalized anxiety disorder) Assessment & Plan: This is a seasonal issue I am going to resolve it for now due to currently he has no anxiety I did mention to him that SSRI may be just seasonally could be helpful if he starts to get symptoms to give me a call and I will send something in   Pure hypertriglyceridemia  Carpal tunnel syndrome of right wrist Assessment & Plan: This is a clearly severe chronic carpal tunnel related to work that is flared by driving or his work he often drops a screwdriver.  I recommend he wear it night splint which can be obtained easily at any pharmacy or [facility] that specific for carpal tunnel and just wear it while he sleeps and when he is at work.  I explained that if he does not wear it he will slowly lose function in the hand altogether that the carpal tunnel syndrome is a repetitive use injury problem.  I advised that he consider meeting with a hand surgeon for carpal tunnel release so he does not have to worry about it anymore but he did not want to miss work and asked me to hold off on making the  referral   Hypokalemia Assessment & Plan: Stable with his current diet although I do encourage reducing alcohol and when he drinks water I recommend drinking water with electrolytes like Gatorade to help address this issue   Alcohol use     Lula Olszewski, MD   Return in about 1 year (around 01/30/2023) for Annual Exam.   - If your condition fails to resolve or you have other questions / concerns: please contact me via phone [phone] or MyChart messaging.  - Please bring all your medicines to your next appointment. This is the best way for me to know exactly what you're taking.  - If your condition begins to worsen or become severe:  go to the ER.   IF you received an x-ray today, you will receive an invoice from [facility]. Please contact [facility] at [phone] with questions or concerns regarding your invoice.    IF you received labwork today, you will receive an invoice from [facility]. Please contact [facility] at [phone] with questions or concerns regarding your invoice.    Our billing staff will not be able to assist you with questions regarding bills from these companies.   --------------------------------------------------------------------------------------------------------------------  You will be contacted with the lab results as soon as they are available. The fastest way to get your results is to activate your My Chart account. Instructions are  located on the last page of this paperwork. If you have not heard from [place] regarding the results in 2 weeks, please contact this office. For any labs or imaging tests, we will call you if the results are significantly abnormal.  Most normal results will be posted to myChart as soon as they are available and I will comment on them there within 2-3 business days.

## 2022-01-29 NOTE — Assessment & Plan Note (Signed)
This is a seasonal issue I am going to resolve it for now due to currently he has no anxiety I did mention to him that SSRI may be just seasonally could be helpful if he starts to get symptoms to give me a call and I will send something in

## 2022-01-29 NOTE — Assessment & Plan Note (Signed)
Long-term smoking of 1.5 pack/day I asked if he had any interest in quitting and he said it is 0 of 10 so I did not do any further tobacco use cessation counseling today although I did make him aware that I think it is best for his health if he gets motivated I will be here to help

## 2022-01-29 NOTE — Assessment & Plan Note (Signed)
This is a clearly severe chronic carpal tunnel related to work that is flared by driving or his work he often drops a screwdriver.  I recommend he wear it night splint which can be obtained easily at any pharmacy or [facility] that specific for carpal tunnel and just wear it while he sleeps and when he is at work.  I explained that if he does not wear it he will slowly lose function in the hand altogether that the carpal tunnel syndrome is a repetitive use injury problem.  I advised that he consider meeting with a hand surgeon for carpal tunnel release so he does not have to worry about it anymore but he did not want to miss work and asked me to hold off on making the referral

## 2022-01-29 NOTE — Progress Notes (Signed)
Today's healthcare provider: Loralee Pacas, MD  Phone: [phone]  New patient visit  Visit Date: 01/29/2022 Patient: Warren Ross   DOB: 07-Apr-1986   36 y.o. Male  MRN: [id number]  Assessment and Plan:   Detrich was seen today for establish care.  Essential hypertension Assessment & Plan: Blood pressure is uncontrolled today but he has been out of his medication so we will just refill that for today and I encouraged him to measure his blood pressure at home and abstain from alcohol and nonsteroidal anti-inflammatory drugs and salt all of which can raise the blood pressure   Tobacco use Assessment & Plan: Long-term smoking of 1.5 pack/day I asked if he had any interest in quitting and he said it is 0 of 10 so I did not do any further tobacco use cessation counseling today although I did make him aware that I think it is best for his health if he gets motivated I will be here to help   GAD (generalized anxiety disorder) Assessment & Plan: This is a seasonal issue I am going to resolve it for now due to currently he has no anxiety I did mention to him that SSRI may be just seasonally could be helpful if he starts to get symptoms to give me a call and I will send something in   Pure hypertriglyceridemia Overview: This is primarily a hypertriglyceridemia I did encourage to minimize fats in the diet preferably also low-carb diet so increasing protein would be the way to go his cholesterols not bad enough to warrant aspirin advised against   Carpal tunnel syndrome of right wrist Assessment & Plan: This is a clearly severe chronic carpal tunnel related to work that is flared by driving or his work he often drops a screwdriver.  I recommend he wear it night splint which can be obtained easily at any pharmacy or [facility] that specific for carpal tunnel and just wear it while he sleeps and when he is at work.  I explained that if he does not wear it he will slowly lose function in the  hand altogether that the carpal tunnel syndrome is a repetitive use injury problem.  I advised that he consider meeting with a hand surgeon for carpal tunnel release so he does not have to worry about it anymore but he did not want to miss work and asked me to hold off on making the referral   Hypokalemia Overview: Calcium runs 3.5-4 most of the time and he drinks a lot of water where he works in [employer] and a lot of beer where he likes beer and so this is not thought to be a genetic disorder  Assessment & Plan: Stable with his current diet although I do encourage reducing alcohol and when he drinks water I recommend drinking water with electrolytes like Gatorade to help address this issue   Alcohol use Overview: He reports drinking Coors light about 12 a day not interested in cutting back at this time but I let him know that I am addiction certified and an how lots of helpful approaches if he is interested in cutting back and I explained his alcohol use compared to average alcohol use to help him visualize the issue     Health Maintenance  Topic Date Due   COVID-19 Vaccine (1) Never done   TETANUS/TDAP  03/20/2021   INFLUENZA VACCINE  Never done   Hepatitis C Screening  Completed   HIV Screening  Completed   HPV  VACCINES  Aged Out    Recommended follow up: Return in about 1 year (around 01/30/2023) for Annual Exam.  Subjective:  Patient presents today to establish care.   Chief Complaint  Patient presents with   Establish Care    For history taking, I took a per problem history from the patient and chart review as follows: Problem  Hypokalemia   Calcium runs 3.5-4 most of the time and he drinks a lot of water where he works in [employer] and a lot of beer where he likes beer and so this is not thought to be a genetic disorder   Alcohol Use   He reports drinking Coors light about 12 a day not interested in cutting back at this time but I let him know that I am addiction certified and  an how lots of helpful approaches if he is interested in cutting back and I explained his alcohol use compared to average alcohol use to help him visualize the issue   Carpal Tunnel Syndrome of Right Wrist  Hyperlipidemia   This is primarily a hypertriglyceridemia I did encourage to minimize fats in the diet preferably also low-carb diet so increasing protein would be the way to go his cholesterols not bad enough to warrant aspirin advised against   Tobacco Use  Essential Hypertension  Gad (Generalized Anxiety Disorder) (Resolved)     Depression Screen    01/29/2022    3:03 PM 01/24/2021    4:21 PM 01/04/2019    8:23 AM 12/03/2018    8:02 AM  PHQ 2/9 Scores  PHQ - 2 Score 0 0 0 2  PHQ- 9 Score   0 3   No results found for any visits on 01/29/22.   The following were reviewed and entered/updated in epic: Past Medical History:  Diagnosis Date   Carpal tunnel syndrome of right wrist 01/26/2019   Carpal tunnel syndrome, left 01/26/2019   Cellulitis 06/21/2019   Hypertension    Right carpal tunnel syndrome 01/26/2019   Viral illness 05/02/2020   Past Surgical History:  Procedure Laterality Date   HAND SURGERY Left    MANDIBLE SURGERY Left 2017   Past Surgical History:  Procedure Laterality Date   HAND SURGERY Left    MANDIBLE SURGERY Left 2017   Family Status  Relation Name Status   Mother  Alive   Father  Deceased   MGF  (Not Specified)   Brother  Alive   Family History  Problem Relation Age of Onset   COPD Mother    Early death Father    Diabetes Maternal Grandfather    Outpatient Medications Prior to Visit  Medication Sig Dispense Refill   amLODipine (NORVASC) 10 MG tablet Take 1 tablet (10 mg total) by mouth daily. 90 tablet 1   No facility-administered medications prior to visit.    No Known Allergies Social History   Tobacco Use   Smoking status: Every Day    Packs/day: 1.50    Types: Cigarettes   Smokeless tobacco: Former  Scientific laboratory technician Use:  Never used  Substance Use Topics   Alcohol use: Yes    Alcohol/week: 70.0 standard drinks of alcohol    Types: 70 Cans of beer per week    Comment: "close to 24 beers a day"   Drug use: Not Currently    Types: Marijuana    Comment: every once in a while    Immunization History  Administered Date(s) Administered   Tdap 03/21/2011  Objective:  BP 117/79 (BP Location: Left Arm, Patient Position: Sitting)   Pulse 90   Temp 98.6 F (37 C) (Temporal)   Resp 14   Ht _0  (1.956 m)   Wt 184 lb 12.8 oz (83.8 kg)   SpO2 98%   BMI 21.91 kg/m  Body mass index is 21.91 kg/m.  He  is a very cordial and polite person who was a pleasure to meet.  Gen: NAD, resting comfortably,  HEENT: Mucous membranes are moist. Sclera conjunctiva and lids grossly normal Neck: no thyromegaly, no cervical lymphadenopathy CV: RRR no murmurs rubs or gallops Lungs: CTAB no crackles, wheeze, rhonchi Ext: no edema Skin: warm, dry Neuro: grossly intact  No images are attached to the encounter or orders placed in the encounter.  Results for orders placed or performed in visit on 01/24/21  COMPLETE METABOLIC PANEL WITH GFR  Result Value Ref Range   Glucose, Bld 92 65 - 99 mg/dL   BUN 9 7 - 25 mg/dL   Creat 1.01 0.60 - 1.26 mg/dL   eGFR 98 > OR = 60 mL/min/1.34m   BUN/Creatinine Ratio NOT APPLICABLE 6 - 22 (calc)   Sodium 136 135 - 146 mmol/L   Potassium 4.5 3.5 - 5.3 mmol/L   Chloride 102 98 - 110 mmol/L   CO2 26 20 - 32 mmol/L   Calcium 9.8 8.6 - 10.3 mg/dL   Total Protein 6.6 6.1 - 8.1 g/dL   Albumin 4.3 3.6 - 5.1 g/dL   Globulin 2.3 1.9 - 3.7 g/dL (calc)   AG Ratio 1.9 1.0 - 2.5 (calc)   Total Bilirubin 0.5 0.2 - 1.2 mg/dL   Alkaline phosphatase (APISO) 65 36 - 130 U/L   AST 20 10 - 40 U/L   ALT 13 9 - 46 U/L  Hepatitis C antibody  Result Value Ref Range   Hepatitis C Ab NON-REACTIVE NON-REACTIVE   SIGNAL TO CUT-OFF 0.01 <1.00  Lipid Panel  Result Value Ref Range   Cholesterol 160  <200 mg/dL   HDL 56 > OR = 40 mg/dL   Triglycerides 160 (H) <150 mg/dL   LDL Cholesterol (Calc) 78 mg/dL (calc)   Total CHOL/HDL Ratio 2.9 <5.0 (calc)   Non-HDL Cholesterol (Calc) 104 <130 mg/dL (calc)  CBC with Differential  Result Value Ref Range   WBC 7.2 3.8 - 10.8 Thousand/uL   RBC 4.92 4.20 - 5.80 Million/uL   Hemoglobin 15.6 13.2 - 17.1 g/dL   HCT 45.9 38.5 - 50.0 %   MCV 93.3 80.0 - 100.0 fL   MCH 31.7 27.0 - 33.0 pg   MCHC 34.0 32.0 - 36.0 g/dL   RDW 12.4 11.0 - 15.0 %   Platelets 251 140 - 400 Thousand/uL   MPV 10.5 7.5 - 12.5 fL   Neutro Abs 3,290 1,500 - 7,800 cells/uL   Lymphs Abs 2,686 850 - 3,900 cells/uL   Absolute Monocytes 1,015 (H) 200 - 950 cells/uL   Eosinophils Absolute 130 15 - 500 cells/uL   Basophils Absolute 79 0 - 200 cells/uL   Neutrophils Relative % 45.7 %   Total Lymphocyte 37.3 %   Monocytes Relative 14.1 %   Eosinophils Relative 1.8 %   Basophils Relative 1.1 %

## 2022-01-29 NOTE — Assessment & Plan Note (Signed)
Blood pressure is uncontrolled today but he has been out of his medication so we will just refill that for today and I encouraged him to measure his blood pressure at home and abstain from alcohol and nonsteroidal anti-inflammatory drugs and salt all of which can raise the blood pressure

## 2022-01-29 NOTE — Assessment & Plan Note (Signed)
Stable with his current diet although I do encourage reducing alcohol and when he drinks water I recommend drinking water with electrolytes like Gatorade to help address this issue

## 2022-01-31 ENCOUNTER — Telehealth: Payer: Self-pay | Admitting: Internal Medicine

## 2022-01-31 ENCOUNTER — Other Ambulatory Visit: Payer: Self-pay

## 2022-01-31 DIAGNOSIS — I10 Essential (primary) hypertension: Secondary | ICD-10-CM

## 2022-01-31 MED ORDER — AMLODIPINE BESYLATE 10 MG PO TABS: 10.0000 mg | ORAL_TABLET | Freq: Every day | ORAL | 3 refills | Status: DC

## 2022-01-31 NOTE — Telephone Encounter (Signed)
Patient was previously prescribed the following medication from previous PCP. Patient requests the following RX be sent asap:   LAST APPOINTMENT DATE:  Please schedule appointment if longer than 1 year  01/29/22  NEXT APPOINTMENT DATE:  MEDICATION:amLODipine (NORVASC) 10 MG tablet  Is the patient out of medication? Yes  PHARMACY:  [id number] [place], [place] - [address] AT [place] Phone:  [phone]  Fax:  [phone]     Let patient know to contact pharmacy at the end of the day to make sure medication is ready.  Please notify patient to allow 48-72 hours to process

## 2022-01-31 NOTE — Telephone Encounter (Signed)
Sent a prescription in of 90 tablets, 3 refills of amlodipine 10mg  to the pharmacy on file.

## 2022-09-08 DIAGNOSIS — M79641 Pain in right hand: Secondary | ICD-10-CM | POA: Diagnosis not present

## 2022-09-08 DIAGNOSIS — M25562 Pain in left knee: Secondary | ICD-10-CM | POA: Diagnosis not present

## 2022-09-11 ENCOUNTER — Ambulatory Visit (INDEPENDENT_AMBULATORY_CARE_PROVIDER_SITE_OTHER): Payer: BC Managed Care – PPO | Admitting: Orthopedic Surgery

## 2022-09-11 ENCOUNTER — Other Ambulatory Visit (INDEPENDENT_AMBULATORY_CARE_PROVIDER_SITE_OTHER): Payer: BC Managed Care – PPO

## 2022-09-11 ENCOUNTER — Encounter: Payer: Self-pay | Admitting: Orthopedic Surgery

## 2022-09-11 DIAGNOSIS — M25562 Pain in left knee: Secondary | ICD-10-CM

## 2022-09-11 NOTE — Progress Notes (Signed)
Office Visit Note   Patient: Warren Ross           Date of Birth: 03-04-1986           MRN: [id number] Visit Date: 09/11/2022              Requested by: Lula Olszewski, MD [address] [place],  [place] [address] PCP: Lula Olszewski, MD  Chief Complaint  Patient presents with   Left Knee - Pain      HPI: Patient is a 37 year old gentleman who is seen for initial evaluation for acute blunt trauma left knee.  Patient states that on May 5 he fell off the porch missing a step.  He went to emerge after our clinic on May 6.  Patient complains of pain and swelling beneath the patella.  Assessment & Plan: Visit Diagnoses:  1. Acute pain of left knee     Plan: Patient has exacerbated chondromalacia of the patellofemoral joint.  Patient was provided a note that he may return to work without restrictions.  Follow-Up Instructions: Return if symptoms worsen or fail to improve.   Ortho Exam  Patient is alert, oriented, no adenopathy, well-dressed, normal affect, normal respiratory effort. Examination patient has full active range of motion of the left knee no extensor lag.  There is crepitation of the patellofemoral joint with range of motion.  There is no effusion collaterals and cruciates are stable.  Imaging: XR Knee 1-2 Views Left  Result Date: 09/11/2022 2 view radiographs of the left knee shows no bony abnormality with a congruent joint space.  No images are attached to the encounter.  Labs: Lab Results  Component Value Date   REPTSTATUS 06/26/2019 FINAL 06/21/2019   REPTSTATUS 06/26/2019 FINAL 06/21/2019   CULT  06/21/2019    NO GROWTH 5 DAYS Performed at [facility], [address] [place], [place] [address]    CULT  06/21/2019    NO GROWTH 5 DAYS Performed at [facility], [address], [place], [place] [address]      No results found for: "ALBUMIN", "PREALBUMIN", "CBC"  Lab Results  Component Value Date   MG 1.9 06/22/2019   No  results found for: "VD25OH"  No results found for: "PREALBUMIN"    Latest Ref Rng & Units 01/24/2021    4:30 PM 06/28/2019    9:49 AM 06/23/2019    2:00 AM  CBC EXTENDED  WBC 3.8 - 10.8 Thousand/uL 7.2  7.5  11.4   RBC 4.20 - 5.80 Million/uL 4.92  4.92  4.22   Hemoglobin 13.2 - 17.1 g/dL 21.3  08.6  57.8   HCT 38.5 - 50.0 % 45.9  46.0  40.4   Platelets 140 - 400 Thousand/uL 251  468  222   NEUT# 1,500 - 7,800 cells/uL 3,290  4,013  8.1   Lymph# 850 - 3,900 cells/uL 2,686  2,310  1.6      There is no height or weight on file to calculate BMI.  Orders:  Orders Placed This Encounter  Procedures   XR Knee 1-2 Views Left   No orders of the defined types were placed in this encounter.    Procedures: No procedures performed  Clinical Data: No additional findings.  ROS:  All other systems negative, except as noted in the HPI. Review of Systems  Objective: Vital Signs: There were no vitals taken for this visit.  Specialty Comments:  No specialty comments available.  PMFS  History: Patient Active Problem List   Diagnosis Date Noted   Hypokalemia 06/21/2019   Alcohol use 06/21/2019   Carpal tunnel syndrome of right wrist 01/26/2019   Hyperlipidemia 12/03/2018   Tobacco use 01/16/2017   Essential hypertension 01/16/2017   Past Medical History:  Diagnosis Date   Carpal tunnel syndrome of right wrist 01/26/2019   Carpal tunnel syndrome, left 01/26/2019   Cellulitis 06/21/2019   Hypertension    Right carpal tunnel syndrome 01/26/2019   Viral illness 05/02/2020    Family History  Problem Relation Age of Onset   COPD Mother    Early death Father    Diabetes Maternal Grandfather     Past Surgical History:  Procedure Laterality Date   HAND SURGERY Left    MANDIBLE SURGERY Left 2017   Social History   Occupational History   Not on file  Tobacco Use   Smoking status: Every Day    Packs/day: 1.5    Types: Cigarettes   Smokeless tobacco: Former  Haematologist Use: Never used  Substance and Sexual Activity   Alcohol use: Yes    Alcohol/week: 70.0 standard drinks of alcohol    Types: 70 Cans of beer per week    Comment: "close to 24 beers a day"   Drug use: Not Currently    Types: Marijuana    Comment: every once in a while   Sexual activity: Yes    Birth control/protection: None

## 2023-04-08 ENCOUNTER — Other Ambulatory Visit: Payer: Self-pay | Admitting: Internal Medicine

## 2023-04-08 DIAGNOSIS — I10 Essential (primary) hypertension: Secondary | ICD-10-CM

## 2023-04-28 ENCOUNTER — Emergency Department (HOSPITAL_COMMUNITY): Payer: BC Managed Care – PPO

## 2023-04-28 ENCOUNTER — Inpatient Hospital Stay (HOSPITAL_COMMUNITY)
Admission: EM | Admit: 2023-04-28 | Discharge: 2023-05-02 | DRG: 275 | Disposition: A | Discharge status: Home / Self Care | Payer: BC Managed Care – PPO | Attending: Family Medicine | Admitting: Family Medicine

## 2023-04-28 DIAGNOSIS — Z7984 Long term (current) use of oral hypoglycemic drugs: Secondary | ICD-10-CM

## 2023-04-28 DIAGNOSIS — F17200 Nicotine dependence, unspecified, uncomplicated: Secondary | ICD-10-CM

## 2023-04-28 DIAGNOSIS — I462 Cardiac arrest due to underlying cardiac condition: Secondary | ICD-10-CM | POA: Diagnosis present

## 2023-04-28 DIAGNOSIS — E781 Pure hyperglyceridemia: Secondary | ICD-10-CM | POA: Diagnosis not present

## 2023-04-28 DIAGNOSIS — I469 Cardiac arrest, cause unspecified: Secondary | ICD-10-CM | POA: Diagnosis not present

## 2023-04-28 DIAGNOSIS — Y902 Blood alcohol level of 40-59 mg/100 ml: Secondary | ICD-10-CM | POA: Diagnosis present

## 2023-04-28 DIAGNOSIS — I480 Paroxysmal atrial fibrillation: Secondary | ICD-10-CM | POA: Diagnosis present

## 2023-04-28 DIAGNOSIS — I110 Hypertensive heart disease with heart failure: Secondary | ICD-10-CM | POA: Diagnosis present

## 2023-04-28 DIAGNOSIS — Z950 Presence of cardiac pacemaker: Secondary | ICD-10-CM | POA: Diagnosis not present

## 2023-04-28 DIAGNOSIS — F1010 Alcohol abuse, uncomplicated: Secondary | ICD-10-CM | POA: Diagnosis not present

## 2023-04-28 DIAGNOSIS — I5021 Acute systolic (congestive) heart failure: Secondary | ICD-10-CM | POA: Diagnosis present

## 2023-04-28 DIAGNOSIS — I2510 Atherosclerotic heart disease of native coronary artery without angina pectoris: Secondary | ICD-10-CM | POA: Diagnosis present

## 2023-04-28 DIAGNOSIS — E785 Hyperlipidemia, unspecified: Secondary | ICD-10-CM | POA: Diagnosis present

## 2023-04-28 DIAGNOSIS — I340 Nonrheumatic mitral (valve) insufficiency: Secondary | ICD-10-CM | POA: Diagnosis not present

## 2023-04-28 DIAGNOSIS — Z1389 Encounter for screening for other disorder: Secondary | ICD-10-CM | POA: Diagnosis not present

## 2023-04-28 DIAGNOSIS — Z789 Other specified health status: Secondary | ICD-10-CM | POA: Diagnosis present

## 2023-04-28 DIAGNOSIS — E876 Hypokalemia: Secondary | ICD-10-CM | POA: Diagnosis present

## 2023-04-28 DIAGNOSIS — I4901 Ventricular fibrillation: Secondary | ICD-10-CM | POA: Diagnosis not present

## 2023-04-28 DIAGNOSIS — Z79899 Other long term (current) drug therapy: Secondary | ICD-10-CM | POA: Diagnosis not present

## 2023-04-28 DIAGNOSIS — I428 Other cardiomyopathies: Secondary | ICD-10-CM | POA: Diagnosis not present

## 2023-04-28 DIAGNOSIS — Z833 Family history of diabetes mellitus: Secondary | ICD-10-CM

## 2023-04-28 DIAGNOSIS — R0902 Hypoxemia: Secondary | ICD-10-CM | POA: Diagnosis not present

## 2023-04-28 DIAGNOSIS — I4891 Unspecified atrial fibrillation: Secondary | ICD-10-CM | POA: Insufficient documentation

## 2023-04-28 DIAGNOSIS — R55 Syncope and collapse: Secondary | ICD-10-CM | POA: Diagnosis not present

## 2023-04-28 DIAGNOSIS — Z8241 Family history of sudden cardiac death: Secondary | ICD-10-CM | POA: Diagnosis not present

## 2023-04-28 DIAGNOSIS — Z8249 Family history of ischemic heart disease and other diseases of the circulatory system: Secondary | ICD-10-CM | POA: Diagnosis not present

## 2023-04-28 DIAGNOSIS — F1020 Alcohol dependence, uncomplicated: Secondary | ICD-10-CM | POA: Diagnosis not present

## 2023-04-28 DIAGNOSIS — Z825 Family history of asthma and other chronic lower respiratory diseases: Secondary | ICD-10-CM

## 2023-04-28 DIAGNOSIS — Z95810 Presence of automatic (implantable) cardiac defibrillator: Secondary | ICD-10-CM | POA: Diagnosis not present

## 2023-04-28 DIAGNOSIS — R569 Unspecified convulsions: Secondary | ICD-10-CM | POA: Diagnosis not present

## 2023-04-28 DIAGNOSIS — I472 Ventricular tachycardia, unspecified: Secondary | ICD-10-CM | POA: Diagnosis not present

## 2023-04-28 DIAGNOSIS — R4020 Unspecified coma: Secondary | ICD-10-CM | POA: Diagnosis not present

## 2023-04-28 DIAGNOSIS — F17210 Nicotine dependence, cigarettes, uncomplicated: Secondary | ICD-10-CM | POA: Diagnosis not present

## 2023-04-28 DIAGNOSIS — I10 Essential (primary) hypertension: Secondary | ICD-10-CM | POA: Diagnosis present

## 2023-04-28 DIAGNOSIS — E7800 Pure hypercholesterolemia, unspecified: Secondary | ICD-10-CM | POA: Diagnosis present

## 2023-04-28 DIAGNOSIS — F419 Anxiety disorder, unspecified: Secondary | ICD-10-CM | POA: Diagnosis present

## 2023-04-28 HISTORY — DX: Cardiac arrest, cause unspecified: I46.9

## 2023-04-28 HISTORY — DX: Unspecified atrial fibrillation: I48.91

## 2023-04-28 LAB — CBC
HCT: 45.3 % (ref 39.0–52.0)
Hemoglobin: 15.2 g/dL (ref 13.0–17.0)
MCH: 32.1 pg (ref 26.0–34.0)
MCHC: 33.6 g/dL (ref 30.0–36.0)
MCV: 95.6 fL (ref 80.0–100.0)
Platelets: 288 10*3/uL (ref 150–400)
RBC: 4.74 MIL/uL (ref 4.22–5.81)
RDW: 12.2 % (ref 11.5–15.5)
WBC: 12.3 10*3/uL — ABNORMAL HIGH (ref 4.0–10.5)
nRBC: 0 % (ref 0.0–0.2)

## 2023-04-28 LAB — I-STAT VENOUS BLOOD GAS, ED
Acid-base deficit: 5 mmol/L — ABNORMAL HIGH (ref 0.0–2.0)
Bicarbonate: 18.2 mmol/L — ABNORMAL LOW (ref 20.0–28.0)
Calcium, Ion: 0.98 mmol/L — ABNORMAL LOW (ref 1.15–1.40)
HCT: 46 % (ref 39.0–52.0)
Hemoglobin: 15.6 g/dL (ref 13.0–17.0)
O2 Saturation: 90 %
Potassium: 3.2 mmol/L — ABNORMAL LOW (ref 3.5–5.1)
Sodium: 135 mmol/L (ref 135–145)
TCO2: 19 mmol/L — ABNORMAL LOW (ref 22–32)
pCO2, Ven: 29.3 mm[Hg] — ABNORMAL LOW (ref 44–60)
pH, Ven: 7.401 (ref 7.25–7.43)
pO2, Ven: 57 mm[Hg] — ABNORMAL HIGH (ref 32–45)

## 2023-04-28 LAB — BASIC METABOLIC PANEL
Anion gap: 16 — ABNORMAL HIGH (ref 5–15)
BUN: 7 mg/dL (ref 6–20)
CO2: 18 mmol/L — ABNORMAL LOW (ref 22–32)
Calcium: 8.7 mg/dL — ABNORMAL LOW (ref 8.9–10.3)
Chloride: 102 mmol/L (ref 98–111)
Creatinine, Ser: 1.12 mg/dL (ref 0.61–1.24)
GFR, Estimated: >60 mL/min (ref >60)
Glucose, Bld: 167 mg/dL — ABNORMAL HIGH (ref 70–99)
Potassium: 3.1 mmol/L — ABNORMAL LOW (ref 3.5–5.1)
Sodium: 136 mmol/L (ref 135–145)

## 2023-04-28 LAB — TROPONIN I (HIGH SENSITIVITY)
Troponin I (High Sensitivity): 12 ng/L (ref <18)
Troponin I (High Sensitivity): 217 ng/L (ref <18)

## 2023-04-28 LAB — ETHANOL: Alcohol, Ethyl (B): 41 mg/dL — ABNORMAL HIGH (ref <10)

## 2023-04-28 LAB — RAPID URINE DRUG SCREEN, HOSP PERFORMED
Amphetamines: NOT DETECTED
Barbiturates: NOT DETECTED
Benzodiazepines: NOT DETECTED
Cocaine: NOT DETECTED
Opiates: NOT DETECTED
Tetrahydrocannabinol: NOT DETECTED

## 2023-04-28 LAB — PHOSPHORUS: Phosphorus: 2.4 mg/dL — ABNORMAL LOW (ref 2.5–4.6)

## 2023-04-28 LAB — MAGNESIUM: Magnesium: 1.8 mg/dL (ref 1.7–2.4)

## 2023-04-28 LAB — I-STAT CG4 LACTIC ACID, ED
Lactic Acid, Venous: 5.7 mmol/L (ref 0.5–1.9)
Lactic Acid, Venous: 1.4 mmol/L (ref 0.5–1.9)

## 2023-04-28 LAB — HIV ANTIBODY (ROUTINE TESTING W REFLEX): HIV Screen 4th Generation wRfx: NONREACTIVE

## 2023-04-28 MED ORDER — POTASSIUM CHLORIDE 10 MEQ/100ML IV SOLN
10.0000 meq | INTRAVENOUS | Status: AC
  Administered 2023-04-28 (×2): 10 meq via INTRAVENOUS
  Filled 2023-04-28 (×2): qty 100

## 2023-04-28 MED ORDER — ACETAMINOPHEN 650 MG RE SUPP: 650.0000 mg | Freq: Four times a day (QID) | RECTAL | Status: DC | PRN

## 2023-04-28 MED ORDER — AMIODARONE HCL IN DEXTROSE 360-4.14 MG/200ML-% IV SOLN
60.0000 mg/h | INTRAVENOUS | Status: DC
  Administered 2023-04-28 (×2): 60 mg/h via INTRAVENOUS

## 2023-04-28 MED ORDER — ONDANSETRON 4 MG PO TBDP: 4.0000 mg | ORAL_TABLET | Freq: Four times a day (QID) | ORAL | Status: AC | PRN

## 2023-04-28 MED ORDER — SODIUM CHLORIDE 0.9% FLUSH
3.0000 mL | Freq: Two times a day (BID) | INTRAVENOUS | Status: DC
  Administered 2023-04-28 – 2023-05-01 (×5): 3 mL via INTRAVENOUS

## 2023-04-28 MED ORDER — CHLORDIAZEPOXIDE HCL 25 MG PO CAPS
25.0000 mg | ORAL_CAPSULE | Freq: Once | ORAL | Status: AC
  Filled 2023-04-28: qty 1

## 2023-04-28 MED ORDER — POTASSIUM CHLORIDE CRYS ER 20 MEQ PO TBCR
40.0000 meq | EXTENDED_RELEASE_TABLET | Freq: Once | ORAL | Status: AC
  Administered 2023-04-28: 40 meq via ORAL
  Filled 2023-04-28: qty 2

## 2023-04-28 MED ORDER — ENOXAPARIN SODIUM 40 MG/0.4ML IJ SOSY: 40.0000 mg | PREFILLED_SYRINGE | INTRAMUSCULAR | Status: DC

## 2023-04-28 MED ORDER — ASPIRIN 81 MG PO CHEW
324.0000 mg | CHEWABLE_TABLET | Freq: Once | ORAL | Status: AC
  Administered 2023-04-28: 324 mg via ORAL
  Filled 2023-04-28: qty 4

## 2023-04-28 MED ORDER — NICOTINE 21 MG/24HR TD PT24
21.0000 mg | MEDICATED_PATCH | Freq: Every day | TRANSDERMAL | Status: DC
  Administered 2023-04-28 – 2023-05-02 (×5): 21 mg via TRANSDERMAL
  Filled 2023-04-28 (×5): qty 1

## 2023-04-28 MED ORDER — LOPERAMIDE HCL 2 MG PO CAPS: 2.0000 mg | ORAL_CAPSULE | ORAL | Status: AC | PRN

## 2023-04-28 MED ORDER — ADULT MULTIVITAMIN W/MINERALS CH
1.0000 | ORAL_TABLET | Freq: Every day | ORAL | Status: DC
  Administered 2023-04-28 – 2023-05-02 (×5): 1 via ORAL
  Filled 2023-04-28 (×5): qty 1

## 2023-04-28 MED ORDER — CHLORDIAZEPOXIDE HCL 25 MG PO CAPS: 25.0000 mg | ORAL_CAPSULE | Freq: Four times a day (QID) | ORAL | Status: AC | PRN

## 2023-04-28 MED ORDER — CHLORDIAZEPOXIDE HCL 25 MG PO CAPS
25.0000 mg | ORAL_CAPSULE | Freq: Every day | ORAL | Status: AC
  Administered 2023-05-02: 25 mg via ORAL
  Filled 2023-04-28: qty 1

## 2023-04-28 MED ORDER — HEPARIN (PORCINE) 25000 UT/250ML-% IV SOLN
1650.0000 [IU]/h | INTRAVENOUS | Status: DC
  Administered 2023-04-28: 1100 [IU]/h via INTRAVENOUS
  Administered 2023-04-29: 1600 [IU]/h via INTRAVENOUS
  Administered 2023-04-30: 1650 [IU]/h via INTRAVENOUS
  Filled 2023-04-28 (×3): qty 250

## 2023-04-28 MED ORDER — POLYETHYLENE GLYCOL 3350 17 G PO PACK: 17.0000 g | PACK | Freq: Every day | ORAL | Status: DC | PRN

## 2023-04-28 MED ORDER — CHLORDIAZEPOXIDE HCL 25 MG PO CAPS
25.0000 mg | ORAL_CAPSULE | Freq: Four times a day (QID) | ORAL | Status: AC
  Administered 2023-04-28 – 2023-04-29 (×6): 25 mg via ORAL
  Filled 2023-04-28 (×5): qty 1

## 2023-04-28 MED ORDER — ACETAMINOPHEN 325 MG PO TABS
650.0000 mg | ORAL_TABLET | Freq: Four times a day (QID) | ORAL | Status: DC | PRN
  Administered 2023-04-29 – 2023-05-01 (×3): 650 mg via ORAL
  Filled 2023-04-28 (×3): qty 2

## 2023-04-28 MED ORDER — CHLORDIAZEPOXIDE HCL 25 MG PO CAPS
25.0000 mg | ORAL_CAPSULE | ORAL | Status: AC
  Administered 2023-05-01 (×2): 25 mg via ORAL
  Filled 2023-04-28 (×2): qty 1

## 2023-04-28 MED ORDER — METOPROLOL TARTRATE 5 MG/5ML IV SOLN
5.0000 mg | INTRAVENOUS | Status: DC | PRN
  Filled 2023-04-28: qty 5

## 2023-04-28 MED ORDER — CHLORDIAZEPOXIDE HCL 25 MG PO CAPS
25.0000 mg | ORAL_CAPSULE | Freq: Three times a day (TID) | ORAL | Status: AC
  Administered 2023-04-30 (×3): 25 mg via ORAL
  Filled 2023-04-28 (×3): qty 1

## 2023-04-28 MED ORDER — METOPROLOL TARTRATE 5 MG/5ML IV SOLN
5.0000 mg | Freq: Once | INTRAVENOUS | Status: AC
  Administered 2023-04-28: 5 mg via INTRAVENOUS
  Filled 2023-04-28: qty 5

## 2023-04-28 MED ORDER — THIAMINE HCL 100 MG/ML IJ SOLN
100.0000 mg | Freq: Once | INTRAMUSCULAR | Status: AC
  Administered 2023-04-28: 100 mg via INTRAMUSCULAR
  Filled 2023-04-28: qty 2

## 2023-04-28 MED ORDER — AMIODARONE HCL IN DEXTROSE 360-4.14 MG/200ML-% IV SOLN
30.0000 mg/h | INTRAVENOUS | Status: DC
  Administered 2023-04-28 – 2023-05-01 (×6): 30 mg/h via INTRAVENOUS
  Filled 2023-04-28 (×6): qty 200

## 2023-04-28 MED ORDER — AMIODARONE LOAD VIA INFUSION
150.0000 mg | Freq: Once | INTRAVENOUS | Status: AC
  Administered 2023-04-28: 150 mg via INTRAVENOUS
  Filled 2023-04-28: qty 83.34

## 2023-04-28 MED ORDER — HYDROXYZINE HCL 25 MG PO TABS: 25.0000 mg | ORAL_TABLET | Freq: Four times a day (QID) | ORAL | Status: AC | PRN

## 2023-04-28 NOTE — ED Notes (Signed)
ED TO INPATIENT HANDOFF REPORT  ED Nurse Name and Phone #: Briyan Kleven [phone]   S Name/Age/Gender Warren Ross 37 y.o. male Room/Bed: TRABC/TRABC  Code Status   Code Status: Full Code  Home/SNF/Other Home Patient oriented to: self, place, time, and situation Is this baseline? Yes   Triage Complete: Triage complete  Chief Complaint Cardiac arrest [facility]) [I46.9]  Triage Note Pt was eating at a family's house then had a seizure like activity then became unresponsive. Vfib on the monitor. Shocked 3 times. CPR 4 rounds. No epi given. ROSC achieved. Admits to 6 beers today. Afib RVR on the monitor with HR ranging from 130-190 bpm. Pt is now alert and oriented x 4. Complains of chest pressure. Admits to hearing voices and blurred vision. Only able to see bright lights initially.   EMS VS: Bp 158/106 CBG 199   Allergies No Known Allergies  Level of Care/Admitting Diagnosis ED Disposition     ED Disposition  Admit   Condition  --   Comment  Hospital Area: [facility] [[id number]]  Level of Care: Progressive [102]  Admit to Progressive based on following criteria: CARDIOVASCULAR & THORACIC of moderate stability with acute coronary syndrome symptoms/low risk myocardial infarction/hypertensive urgency/arrhythmias/heart failure potentially compromising stability and stable post cardiovascular intervention patients.  May place patient in observation at [facility] or [facility] if equivalent level of care is available:: No  Covid Evaluation: Asymptomatic - no recent exposure (last 10 days) testing not required  Diagnosis: Cardiac arrest (HCC) [427.5.ICD-9-CM]  Admitting Physician: Synetta Fail [[id number]]  Attending Physician: Synetta Fail [[id number]]          B Medical/Surgery History Past Medical History:  Diagnosis Date   Carpal tunnel syndrome of right wrist 01/26/2019   Carpal tunnel syndrome, left 01/26/2019   Cellulitis 06/21/2019    Hypertension    Right carpal tunnel syndrome 01/26/2019   Viral illness 05/02/2020   Past Surgical History:  Procedure Laterality Date   HAND SURGERY Left    MANDIBLE SURGERY Left 2017     A IV Location/Drains/Wounds Patient Lines/Drains/Airways Status     Active Line/Drains/Airways     Name Placement date Placement time Site Days   Peripheral IV 04/28/23 18 G Anterior;Distal;Left;Upper Antecubital 04/28/23  1641  Antecubital  less than 1   Peripheral IV 04/28/23 18 G Anterior;Distal;Right;Upper Antecubital 04/28/23  1647  Antecubital  less than 1            Intake/Output Last 24 hours No intake or output data in the 24 hours ending 04/28/23 1920  Labs/Imaging Results for orders placed or performed during the hospital encounter of 04/28/23 (from the past 48 hours)  Basic metabolic panel     Status: Abnormal   Collection Time: 04/28/23  4:42 PM  Result Value Ref Range   Sodium 136 135 - 145 mmol/L   Potassium 3.1 (L) 3.5 - 5.1 mmol/L   Chloride 102 98 - 111 mmol/L   CO2 18 (L) 22 - 32 mmol/L   Glucose, Bld 167 (H) 70 - 99 mg/dL    Comment: Glucose reference range applies only to samples taken after fasting for at least 8 hours.   BUN 7 6 - 20 mg/dL   Creatinine, Ser 6.57 0.61 - 1.24 mg/dL   Calcium 8.7 (L) 8.9 - 10.3 mg/dL   GFR, Estimated >84 >69 mL/min    Comment: (NOTE) Calculated using the CKD-EPI Creatinine Equation (2021)    Anion gap 16 (H)  5 - 15    Comment: Performed at [facility], [address], [place], [place] [address]  Troponin I (High Sensitivity)     Status: None   Collection Time: 04/28/23  4:42 PM  Result Value Ref Range   Troponin I (High Sensitivity) 12 <18 ng/L    Comment: (NOTE) Elevated high sensitivity troponin I (hsTnI) values and significant  changes across serial measurements may suggest ACS but many other  chronic and acute conditions are known to elevate hsTnI results.  Refer to the "Links" section for chest pain algorithms  and additional  guidance. Performed at [facility], [address] [place], [place] [address]   CBC     Status: Abnormal   Collection Time: 04/28/23  4:42 PM  Result Value Ref Range   WBC 12.3 (H) 4.0 - 10.5 K/uL   RBC 4.74 4.22 - 5.81 MIL/uL   Hemoglobin 15.2 13.0 - 17.0 g/dL   HCT 96.7 89.3 - 81.0 %   MCV 95.6 80.0 - 100.0 fL   MCH 32.1 26.0 - 34.0 pg   MCHC 33.6 30.0 - 36.0 g/dL   RDW 17.5 10.2 - 58.5 %   Platelets 288 150 - 400 K/uL   nRBC 0.0 0.0 - 0.2 %    Comment: Performed at [facility], [address], [place], [place] [address]  Ethanol     Status: Abnormal   Collection Time: 04/28/23  4:42 PM  Result Value Ref Range   Alcohol, Ethyl (B) 41 (H) <10 mg/dL    Comment: (NOTE) Lowest detectable limit for serum alcohol is 10 mg/dL.  For medical purposes only. Performed at [facility], [address], [place], [place] [address]   I-Stat venous blood gas, [facility] ED, [facility], [facility])     Status: Abnormal   Collection Time: 04/28/23  4:53 PM  Result Value Ref Range   pH, Ven 7.401 7.25 - 7.43   pCO2, Ven 29.3 (L) 44 - 60 mmHg   pO2, Ven 57 (H) 32 - 45 mmHg   Bicarbonate 18.2 (L) 20.0 - 28.0 mmol/L   TCO2 19 (L) 22 - 32 mmol/L   O2 Saturation 90 %   Acid-base deficit 5.0 (H) 0.0 - 2.0 mmol/L   Sodium 135 135 - 145 mmol/L   Potassium 3.2 (L) 3.5 - 5.1 mmol/L   Calcium, Ion 0.98 (L) 1.15 - 1.40 mmol/L   HCT 46.0 39.0 - 52.0 %   Hemoglobin 15.6 13.0 - 17.0 g/dL   Sample type VENOUS   I-Stat CG4 Lactic Acid     Status: Abnormal   Collection Time: 04/28/23  5:05 PM  Result Value Ref Range   Lactic Acid, Venous 5.7 (HH) 0.5 - 1.9 mmol/L   Comment NOTIFIED PHYSICIAN   Rapid urine drug screen (hospital performed)     Status: None   Collection Time: 04/28/23  5:10 PM  Result Value Ref Range   Opiates NONE DETECTED NONE DETECTED   Cocaine NONE DETECTED NONE DETECTED   Benzodiazepines NONE DETECTED NONE DETECTED   Amphetamines NONE DETECTED NONE DETECTED    Tetrahydrocannabinol NONE DETECTED NONE DETECTED   Barbiturates NONE DETECTED NONE DETECTED    Comment: (NOTE) DRUG SCREEN FOR MEDICAL PURPOSES ONLY.  IF CONFIRMATION IS NEEDED FOR ANY PURPOSE, NOTIFY LAB WITHIN 5 DAYS.  LOWEST DETECTABLE LIMITS FOR URINE DRUG SCREEN Drug Class  Cutoff (ng/mL) Amphetamine and metabolites    1000 Barbiturate and metabolites    200 Benzodiazepine                 200 Opiates and metabolites        300 Cocaine and metabolites        300 THC                            50 Performed at [facility], [address], [place], [place] [address]   Magnesium     Status: None   Collection Time: 04/28/23  6:11 PM  Result Value Ref Range   Magnesium 1.8 1.7 - 2.4 mg/dL    Comment: Performed at [facility], [address] [place], [place] [address]  I-Stat CG4 Lactic Acid     Status: None   Collection Time: 04/28/23  6:48 PM  Result Value Ref Range   Lactic Acid, Venous 1.4 0.5 - 1.9 mmol/L   DG Chest Portable 1 View Result Date: 04/28/2023 CLINICAL DATA:  Seizure-like activity followed by an episode of unresponsiveness. Atrial fibrillation. CPR. EXAM: PORTABLE CHEST 1 VIEW COMPARISON:  None Available. FINDINGS: Heart size and pulmonary vascularity are normal. Lungs are clear. No pleural effusions. No pneumothorax. Mediastinal contours appear intact. IMPRESSION: No active disease. Electronically Signed   By: Burman Nieves M.D.   On: 04/28/2023 17:16    Pending Labs Unresulted Labs (From admission, onward)     Start     Ordered   05/05/23 0500  Creatinine, serum  (enoxaparin (LOVENOX)    CrCl >/= 30 ml/min)  Weekly,   R     Comments: while on enoxaparin therapy    04/28/23 1905   04/29/23 0500  Comprehensive metabolic panel  Tomorrow morning,   R        04/28/23 1905   04/29/23 0500  CBC  Tomorrow morning,   R        04/28/23 1905   04/28/23 1858  HIV Antibody (routine testing w rflx)  (HIV Antibody (Routine testing w  reflex) panel)  Once,   R        04/28/23 1905            Vitals/Pain Today's Vitals   04/28/23 1800 04/28/23 1815 04/28/23 1830 04/28/23 1845  BP: (!) 123/98 (!) 142/122 (!) 148/99 (!) 120/95  Pulse: (!) 136 89 (!) 141 (!) 119  Resp: 15 16 11 18   Temp:      TempSrc:      SpO2: 100% 99% 100% 97%  PainSc:        Isolation Precautions No active isolations  Medications Medications  amiodarone (NEXTERONE) 1.8 mg/mL load via infusion 150 mg (150 mg Intravenous Bolus from Bag 04/28/23 1655)    Followed by  amiodarone (NEXTERONE PREMIX) 360-4.14 MG/200ML-% (1.8 mg/mL) IV infusion (60 mg/hr Intravenous New Bag/Given 04/28/23 1652)    Followed by  amiodarone (NEXTERONE PREMIX) 360-4.14 MG/200ML-% (1.8 mg/mL) IV infusion (has no administration in time range)  potassium chloride 10 mEq in 100 mL IVPB (10 mEq Intravenous New Bag/Given 04/28/23 1836)  metoprolol tartrate (LOPRESSOR) injection 5 mg (has no administration in time range)  sodium chloride flush (NS) 0.9 % injection 3 mL (has no administration in time range)  acetaminophen (TYLENOL) tablet 650 mg (has no administration in time range)    Or  acetaminophen (TYLENOL) suppository 650 mg (has no administration in  time range)  polyethylene glycol (MIRALAX / GLYCOLAX) packet 17 g (has no administration in time range)  enoxaparin (LOVENOX) injection 40 mg (has no administration in time range)  aspirin chewable tablet 324 mg (324 mg Oral Given 04/28/23 1654)  potassium chloride SA (KLOR-CON M) CR tablet 40 mEq (40 mEq Oral Given 04/28/23 1837)  metoprolol tartrate (LOPRESSOR) injection 5 mg (5 mg Intravenous Given 04/28/23 1837)    Mobility walks     Focused Assessments Cardiac Assessment Handoff:  Cardiac Rhythm: Atrial fibrillation No results found for: "CKTOTAL", "CKMB", "CKMBINDEX", "TROPONINI" No results found for: "DDIMER" Does the Patient currently have chest pain? No    R Recommendations: See Admitting Provider  Note  Report given to:   Additional Notes: n/a

## 2023-04-28 NOTE — Consult Note (Signed)
Cardiology Consultation   Patient ID: Warren Ross MRN: [id number]; DOB: [birth year]  Admit date: 04/28/2023 Date of Consult: 04/28/2023  PCP:  Warren Olszewski, MD   [facility] Providers Cardiologist:  New to Warren Warren Ross    Patient Profile:   Warren Ross is a 37 y.o. male with a hx of HTN, ETOH abuse, HLD, anxiety, carpal tunnel syndrome, tobacco use, who is being seen 04/28/2023 for the evaluation of cardiac arrest at the request of Warren Ross.  History of Present Illness:   Per chart review, Warren Ross was found unresponsive at home by family with questionable seizure activity prior to that. EMS was called. He was defibrillated three times for ventricular tachycardia in the field (each time the arrhythmia was successfully defibrillated, but quickly returned). He had 4 round of CPR. No epi was given.  He had achieved ROSC and became alert and answering questions after that. He did complain chest pressure, blurry vision, and hearing voice at ER when he arrived. He does drink ETOH regularly, reports average 6 beers daily. He was in A fib RVR upon arrival to ER, started on amiodarone gtt and given ASA 324mg  at ED.   He has hx of HTN and takes amlodipine 10mg  daily at home. He drinks ETOH at regular basis, reported drinking Coors light about 12 daily but had no interest to cut back (in PCP office note 01/29/22). He also smokes 1.5 PPD. He has no known cardiac disease or cardiac workup in the past.   Diagnostic so far showed K 3.1, bicarb 18, glucose 167, Cr 1.12, anion gap 16. Hs trop 12 x1. Lactic acid 5.7. Ion Ca 0.98. WBC 12300. VBG PH 7.4, pO2 57, TCO2 19. Alcohol 41. CXR showed no acute finding.   Cardiology is consulted for concern of ventricular tachycardia and cardiac arrest.   His father had sudden cardiac arrest at age 5.  Apparently he had an autopsy that showed his heart was abnormal, probably "enlarged" as far as the patient recalls.  As a result he was  screened with an echocardiogram but was told that he had a "athletic heart".  At that time he was about 37 years old and was playing competitive soccer.  He has mild exertional dyspnea with greater than usual exertion.  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  He has occasional palpitations that make him stop in his tracks, but he is never had presyncope or syncope.  He denies any chest pain with activity.  He is currently sore following CPR.    Past Medical History:  Diagnosis Date   Carpal tunnel syndrome of right wrist 01/26/2019   Carpal tunnel syndrome, left 01/26/2019   Cellulitis 06/21/2019   Hypertension    Right carpal tunnel syndrome 01/26/2019   Viral illness 05/02/2020    Past Surgical History:  Procedure Laterality Date   HAND SURGERY Left    MANDIBLE SURGERY Left 2017     Home Medications:  Prior to Admission medications   Medication Sig Start Date End Date Taking? Authorizing Provider  amLODipine (NORVASC) 10 MG tablet TAKE 1 TABLET BY MOUTH EVERY DAY 04/08/23  Yes Warren Olszewski, MD    Inpatient Medications: Scheduled Meds:   Continuous Infusions:  amiodarone 60 mg/hr (04/28/23 1652)   Followed by   amiodarone     potassium chloride 10 mEq (04/28/23 1836)   PRN Meds: metoprolol tartrate  Allergies:   No Known Allergies  Social History:   Social History   Socioeconomic History   Marital status: Married    Spouse name: Not on file   Number of children: Not on file   Years of education: Not on file   Highest education level: Not on file  Occupational History   Not on file  Tobacco Use   Smoking status: Every Day    Current packs/day: 1.50    Types: Cigarettes   Smokeless tobacco: Former  Advertising account planner   Vaping status: Never Used  Substance and Sexual Activity   Alcohol use: Yes    Alcohol/week: 70.0 standard drinks of alcohol    Types: 70 Cans of beer per week     Comment: "close to 24 beers a day"   Drug use: Not Currently    Types: Marijuana    Comment: every once in a while   Sexual activity: Yes    Birth control/protection: None  Other Topics Concern   Not on file  Social History Narrative   Not on file   Social Drivers of Health   Financial Resource Strain: Not on file  Food Insecurity: Not on file  Transportation Needs: Not on file  Physical Activity: Not on file  Stress: Not on file  Social Connections: Not on file  Intimate Partner Violence: Not on file    Family History:    Family History  Problem Relation Age of Onset   COPD Mother    Early death Father    Diabetes Maternal Grandfather      ROS:  Please see the history of present illness.   All other ROS reviewed and negative.     Physical Exam/Data:   Vitals:   04/28/23 1715 04/28/23 1800 04/28/23 1815 04/28/23 1830  BP: (!) 133/102 (!) 123/98 (!) 142/122 (!) 148/99  Pulse: (!) 130 (!) 136 89 (!) 141  Resp: 16 15 16 11   Temp:      TempSrc:      SpO2: 99% 100% 99% 100%   No intake or output data in the 24 hours ending 04/28/23 1840    01/29/2022    2:58 PM 01/24/2021    4:15 PM 07/04/2019   12:23 PM  Last 3 Weights  Weight (lbs) 184 lb 12.8 oz 191 lb 191 lb  Weight (kg) 83.825 kg 86.637 kg 86.637 kg     There is no height or weight on file to calculate BMI.  General:  Well nourished, well developed, in no acute distress.  He appears very fit. HEENT: normal Neck: no JVD Vascular: No carotid bruits; Distal pulses 2+ bilaterally Cardiac:  normal S1, S2; irregular; no murmur  Lungs:  clear to auscultation bilaterally, no wheezing, rhonchi or rales  Abd: soft, nontender, no hepatomegaly  Ext: no edema Musculoskeletal:  No deformities, BUE and BLE strength normal and equal Skin: warm and dry  Neuro:  CNs 2-12 intact, no focal abnormalities noted Psych:  Normal affect   EKG:  The EKG was personally reviewed and demonstrates: Atrial fibrillation with rapid  ventricular response, minor nonspecific repolarization abnormalities  The single-lead ECG strips from EMS show clear ventricular fibrillation with successful defibrillation 3 times in a row, followed by either sinus rhythm with frequent PVCs or atrial fibrillation, then recurrent ventricular fibrillation.  No clear evidence to support torsades.  No monomorphic VT is seen either.  Telemetry:  Telemetry was personally reviewed and demonstrates: Atrial fibrillation with RVR  Relevant CV Studies:  Laboratory Data:  High Sensitivity Troponin:   Recent Labs  Lab 04/28/23 1642  TROPONINIHS 12     Chemistry Recent Labs  Lab 04/28/23 1642 04/28/23 1653  NA 136 135  K 3.1* 3.2*  CL 102  --   CO2 18*  --   GLUCOSE 167*  --   BUN 7  --   CREATININE 1.12  --   CALCIUM 8.7*  --   GFRNONAA >60  --   ANIONGAP 16*  --     No results for input(s): "PROT", "ALBUMIN", "AST", "ALT", "ALKPHOS", "BILITOT" in the last 168 hours. Lipids No results for input(s): "CHOL", "TRIG", "HDL", "LABVLDL", "LDLCALC", "CHOLHDL" in the last 168 hours.  Hematology Recent Labs  Lab 04/28/23 1642 04/28/23 1653  WBC 12.3*  --   RBC 4.74  --   HGB 15.2 15.6  HCT 45.3 46.0  MCV 95.6  --   MCH 32.1  --   MCHC 33.6  --   RDW 12.2  --   PLT 288  --    Thyroid No results for input(s): "TSH", "FREET4" in the last 168 hours.  BNPNo results for input(s): "BNP", "PROBNP" in the last 168 hours.  DDimer No results for input(s): "DDIMER" in the last 168 hours.   Radiology/Studies:  DG Chest Portable 1 View Result Date: 04/28/2023 CLINICAL DATA:  Seizure-like activity followed by an episode of unresponsiveness. Atrial fibrillation. CPR. EXAM: PORTABLE CHEST 1 VIEW COMPARISON:  None Available. FINDINGS: Heart size and pulmonary vascularity are normal. Lungs are clear. No pleural effusions. No pneumothorax. Mediastinal contours appear intact. IMPRESSION: No active disease. Electronically Signed   By: Burman Nieves M.D.   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  Ventricular arrhythmia may have occurred on the basis of such a substrate, triggered by overindulgence in alcohol and electrolyte imbalances.  The first step will be to repeat his echocardiogram but I suspect we will need to do an MRI as well.  In the meantime keep him on intravenous amiodarone.  He does have some risk factors for coronary artery disease including hypertension, heavy smoking, male gender and reportedly hypercholesterolemia, but he did not have any prodromal chest pain and does not have any typical ischemic repolarization abnormalities on his ECG.  Still likely that we may have to pursue cardiac catheterization, especially if we find that he has a decreased LV EF. A fib RVR: Has been present now for about 3 hours since his episode of V-fib arrest.  It seems to have some degree of persistence and may be responsible for some of the palpitations that he is experienced in the past.  Could be related to alcohol abuse.  Without knowing his LVEF CHA2DS2-VASc or is only 1 for HTN.  I do not think we should start Eliquis yet since there is a chance he will require invasive procedures such as cardiac catheterization and/or defibrillator implantation.  Will start intravenous heparin. Chronic ETOH abuse: If we identify a dilated cardiomyopathy, another possible etiology will be alcoholic cardiomyopathy.  Advised him to let us know if he is experiencing any symptoms of withdrawal.  High risk that he will require benzodiazepines for detox. Hypokalemia: Currently receiving intravenous potassium  chloride.  Recheck ECG. Heavy smoking: Still in quitting.   Feels some cravings right now.  I think it is okay to give him a nicotine patch. HTN: Add beta-blocker.  Increased adrenergic drive is probably a big part of it.  Was on amlodipine monotherapy as an outpatient, not sure how adequate BP control was recently, but his blood pressure was excellent a year ago at 117/79.     Risk Assessment/Risk Scores:    CHA2DS2-VASc Score = 1   This indicates a 0.6% annual risk of stroke. The patient's score is based upon: CHF History: 0 HTN History: 1 Diabetes History: 0 Stroke History: 0 Vascular Disease History: 0 Age Score: 0 Gender Score: 0          For questions or updates, please contact [facility] Please consult [email or web address] for contact info under    Signed, Cyndi Bender, NP  04/28/2023 6:40 PM

## 2023-04-28 NOTE — ED Provider Notes (Signed)
[place] EMERGENCY DEPARTMENT AT [facility] Provider Note   CSN: [id number] Arrival date & time: 04/28/23  1641     History  Chief Complaint  Patient presents with  . Post CPR    Warren NEYER is a 37 y.o. male.  HPI   Patient has a history of hypertension hypercholesterolemia.  According to the records patient also has a history of chronic alcohol use.  Patient however denied any alcohol or drug use to me.  Patient presented to the ED for evaluation after cardiac arrest.  According to family members he became unresponsive.  EMS was called.  There was question of initial seizure activity but he was found to be in ventricular tachycardia.  Patient was defibrillated 3 times.  Patient had return of spontaneous circulation.  During his EMS evaluation patient became alert and answering questions.  Patient initially indicated he could only see lights but otherwise had limited vision.  While the patient was in the ED feels like his vision is coming back.  He denies any chest pain or shortness of breath.  He does not recall what happened or why he is here  Home Medications Prior to Admission medications   Medication Sig Start Date End Date Taking? Authorizing Provider  amLODipine (NORVASC) 10 MG tablet TAKE 1 TABLET BY MOUTH EVERY DAY 04/08/23  Yes Lula Olszewski, MD      Allergies    Patient has no known allergies.    Review of Systems   Review of Systems  Physical Exam Updated Vital Signs BP (!) 120/95   Pulse (!) 119   Temp 98.1 F (36.7 C) (Tympanic)   Resp 18   SpO2 97%  Physical Exam Vitals and nursing note reviewed.  Constitutional:      General: He is not in acute distress.    Appearance: He is well-developed.  HENT:     Head: Normocephalic and atraumatic.     Right Ear: External ear normal.     Left Ear: External ear normal.  Eyes:     General: No scleral icterus.       Right eye: No discharge.        Left eye: No discharge.      Conjunctiva/sclera: Conjunctivae normal.  Neck:     Trachea: No tracheal deviation.  Cardiovascular:     Rate and Rhythm: Regular rhythm. Tachycardia present.  Pulmonary:     Effort: Pulmonary effort is normal. No respiratory distress.     Breath sounds: Normal breath sounds. No stridor. No wheezing or rales.  Abdominal:     General: Bowel sounds are normal. There is no distension.     Palpations: Abdomen is soft.     Tenderness: There is no abdominal tenderness. There is no guarding or rebound.  Musculoskeletal:        General: No tenderness or deformity.     Cervical back: Neck supple.  Skin:    General: Skin is warm and dry.     Findings: No rash.  Neurological:     General: No focal deficit present.     Mental Status: He is alert.     Cranial Nerves: No cranial nerve deficit, dysarthria or facial asymmetry.     Sensory: No sensory deficit.     Motor: No abnormal muscle tone or seizure activity.     Coordination: Coordination normal.  Psychiatric:        Mood and Affect: Mood normal.     ED Results /  Procedures / Treatments   Labs (all labs ordered are listed, but only abnormal results are displayed) Labs Reviewed  BASIC METABOLIC PANEL - Abnormal; Notable for the following components:      Result Value   Potassium 3.1 (*)    CO2 18 (*)    Glucose, Bld 167 (*)    Calcium 8.7 (*)    Anion gap 16 (*)    All other components within normal limits  CBC - Abnormal; Notable for the following components:   WBC 12.3 (*)    All other components within normal limits  ETHANOL - Abnormal; Notable for the following components:   Alcohol, Ethyl (B) 41 (*)    All other components within normal limits  I-STAT VENOUS BLOOD GAS, ED - Abnormal; Notable for the following components:   pCO2, Ven 29.3 (*)    pO2, Ven 57 (*)    Bicarbonate 18.2 (*)    TCO2 19 (*)    Acid-base deficit 5.0 (*)    Potassium 3.2 (*)    Calcium, Ion 0.98 (*)    All other components within normal limits   I-STAT CG4 LACTIC ACID, ED - Abnormal; Notable for the following components:   Lactic Acid, Venous 5.7 (*)    All other components within normal limits  RAPID URINE DRUG SCREEN, HOSP PERFORMED  MAGNESIUM  CBG MONITORING, ED  I-STAT CG4 LACTIC ACID, ED  TROPONIN I (HIGH SENSITIVITY)  TROPONIN I (HIGH SENSITIVITY)    EKG EKG Interpretation Date/Time:  Tuesday April 28 2023 16:52:33 EST Ventricular Rate:  133 PR Interval:    QRS Duration:  88 QT Interval:  329 QTC Calculation: 490 R Axis:   70  Text Interpretation: Atrial fibrillation Left ventricular hypertrophy Borderline prolonged QT interval a fib is new since last tracing Confirmed by Linwood Dibbles [id number]) on 04/28/2023 5:02:37 PM  Radiology DG Chest Portable 1 View Result Date: 04/28/2023 CLINICAL DATA:  Seizure-like activity followed by an episode of unresponsiveness. Atrial fibrillation. CPR. EXAM: PORTABLE CHEST 1 VIEW COMPARISON:  None Available. FINDINGS: Heart size and pulmonary vascularity are normal. Lungs are clear. No pleural effusions. No pneumothorax. Mediastinal contours appear intact. IMPRESSION: No active disease. Electronically Signed   By: Burman Nieves M.D.   On: 04/28/2023 17:16    Procedures .Critical Care  Performed by: Linwood Dibbles, MD Authorized by: Linwood Dibbles, MD   Critical care provider statement:    Critical care time (minutes):  30   Critical care was time spent personally by me on the following activities:  Development of treatment plan with patient or surrogate, discussions with consultants, evaluation of patient's response to treatment, examination of patient, ordering and review of laboratory studies, ordering and review of radiographic studies, ordering and performing treatments and interventions, pulse oximetry, re-evaluation of patient's condition and review of old charts     Medications Ordered in ED Medications  amiodarone (NEXTERONE) 1.8 mg/mL load via infusion 150 mg (150 mg  Intravenous Bolus from Bag 04/28/23 1655)    Followed by  amiodarone (NEXTERONE PREMIX) 360-4.14 MG/200ML-% (1.8 mg/mL) IV infusion (60 mg/hr Intravenous New Bag/Given 04/28/23 1652)    Followed by  amiodarone (NEXTERONE PREMIX) 360-4.14 MG/200ML-% (1.8 mg/mL) IV infusion (has no administration in time range)  potassium chloride 10 mEq in 100 mL IVPB (10 mEq Intravenous New Bag/Given 04/28/23 1836)  metoprolol tartrate (LOPRESSOR) injection 5 mg (has no administration in time range)  aspirin chewable tablet 324 mg (324 mg Oral Given 04/28/23 1654)  potassium chloride  SA (KLOR-CON M) CR tablet 40 mEq (40 mEq Oral Given 04/28/23 1837)  metoprolol tartrate (LOPRESSOR) injection 5 mg (5 mg Intravenous Given 04/28/23 1837)    ED Course/ Medical Decision Making/ A&P Clinical Course as of 04/28/23 1906  Tue Apr 28, 2023  1702 Initial rhythm strips reviewed.  Consistent with V. tach V-fib.  Patient's EKG now consistent with A-fib RVR [JK]  1729 Venous blood gas shows normal pH.  Lactic acid level elevated [JK]  1729 White blood cell count elevated [JK]  1729 Chest x-ray without acute findings [JK]  1738 Case discussed with Dr. Mayford Knife cardiology.  Cardiology will see the patient.  Requests medical admission [JK]  1903 Case discussed with Dr Alinda Money [JK]    Clinical Course User Index [JK] Linwood Dibbles, MD                                 Medical Decision Making Problems Addressed: Atrial fibrillation, unspecified type [facility]): acute illness or injury that poses a threat to life or bodily functions Hypokalemia: acute illness or injury that poses a threat to life or bodily functions Ventricular fibrillation [facility]): acute illness or injury that poses a threat to life or bodily functions  Amount and/or Complexity of Data Reviewed Labs: ordered. Decision-making details documented in ED Course. Radiology: ordered and independent interpretation performed.  Risk OTC drugs. Prescription drug  management. Decision regarding hospitalization.   Patient presented to the ED for evaluation of possible seizure versus syncopal episode.  When EMS arrived they noted patient to be in ventricular fibrillation.  Patient was shocked 3 times.  He was given CPR.  Patient ultimately had return of spontaneous circulation.  In the ED the patient is alert and awake is complaining of difficulty with his vision although it slowly improved while he was in the ED.  Patient was noted to be in atrial fibrillation.  Patient was started on amiodarone.  Met metoprolol was also added for rate control.  Rhythm strips reviewed and they are consistent with ventricular fibrillation.  Case discussed with cardiology Dr. Mayford Knife.  They will come see the patient in the ED.  Requests medical admission considering his alcohol use hypokalemia.  That may be the contributing factor.        Final Clinical Impression(s) / ED Diagnoses Final diagnoses:  Ventricular fibrillation [facility])  Atrial fibrillation, unspecified type (HCC)  Hypokalemia    Rx / DC Orders ED Discharge Orders     None         Linwood Dibbles, MD 04/28/23 1905

## 2023-04-28 NOTE — H&P (Signed)
History and Physical   Warren Ross GNF:[id number] DOB: [birth year] DOA: 04/28/2023  PCP: Lula Olszewski, MD   Patient coming from: Home  Chief Complaint: Cardiac arrest  HPI: Warren Ross is a 37 y.o. male with medical history significant of hypertension, hyperlipidemia, alcohol use without history of withdrawal presenting status post cardiac arrest.    Patient was at home with family and he is noted to become unresponsive.  EMS was called.  Initially there was possible seizure activity that is uncertain, described as him coming stiff.  This was followed by him becoming blue and making snoring or other gasping sounds as he was lowered to the floor. Monitor was reviewed and patient noted to be in V. tach/V-fib.  Was defibrillated x 3 and ROSC was achieved.  Became alert following this.  And route he stated initially was only seeing lights but vision is slowly returning to the emergency department.  Denies fevers, chills, chest pain, shortness of breath, abdominal pain, constipation, diarrhea, nausea, vomiting.  Does have history of alcohol use but is never gone through withdrawals.  Was admitted for several days in 2021 without withdrawals.  But, is drinking 18 beers daily.  Last drink today.  ED Course: Vital signs in the ED notable for blood pressure in the 120s to 160s systolic.  Heart rate in the 80s to 140s.  Lab workup included BMP with potassium 3.1, bicarb 18, gap 1 16, glucose 167, calcium 8.7.  CBC with leukocytosis 12.5.  Troponin negative with repeat pending.  Lactic acid 5.7 with repeat normal.  Ethanol level 41.  UDS negative.  VBG with normal pH and pCO2 29.3.  Magnesium pending.  Chest x-ray showed no acute Gershon Mussel.  Patient treated aspirin, amiodarone infusion, metoprolol IV, 40 mEq p.o. and 20 mEq IV potassium.  Cardiology consulted and requested medicine admission for alcohol use and possible seizure.  Review of Systems: As per HPI otherwise all other systems  reviewed and are negative.  Past Medical History:  Diagnosis Date   Carpal tunnel syndrome of right wrist 01/26/2019   Carpal tunnel syndrome, left 01/26/2019   Cellulitis 06/21/2019   Hypertension    Right carpal tunnel syndrome 01/26/2019   Viral illness 05/02/2020    Past Surgical History:  Procedure Laterality Date   HAND SURGERY Left    MANDIBLE SURGERY Left 2017    Social History  reports that he has been smoking cigarettes. He has quit using smokeless tobacco. He reports current alcohol use of about 70.0 standard drinks of alcohol per week. He reports that he does not currently use drugs after having used the following drugs: Marijuana.  No Known Allergies  Family History  Problem Relation Age of Onset   COPD Mother    Early death Father    Diabetes Maternal Grandfather   Reviewed on admission  Prior to Admission medications   Medication Sig Start Date End Date Taking? Authorizing Provider  amLODipine (NORVASC) 10 MG tablet TAKE 1 TABLET BY MOUTH EVERY DAY 04/08/23  Yes Lula Olszewski, MD    Physical Exam: Vitals:   04/28/23 1800 04/28/23 1815 04/28/23 1830 04/28/23 1845  BP: (!) 123/98 (!) 142/122 (!) 148/99 (!) 120/95  Pulse: (!) 136 89 (!) 141 (!) 119  Resp: 15 16 11 18   Temp:      TempSrc:      SpO2: 100% 99% 100% 97%    Physical Exam Constitutional:      General: He is not in acute distress.  Appearance: Normal appearance. He is obese.  HENT:     Head: Normocephalic and atraumatic.     Mouth/Throat:     Mouth: Mucous membranes are moist.     Pharynx: Oropharynx is clear.  Eyes:     Extraocular Movements: Extraocular movements intact.     Pupils: Pupils are equal, round, and reactive to light.  Cardiovascular:     Rate and Rhythm: Tachycardia present. Rhythm irregular.     Pulses: Normal pulses.     Heart sounds: Normal heart sounds.  Pulmonary:     Effort: Pulmonary effort is normal. No respiratory distress.     Breath sounds: Normal breath  sounds.  Abdominal:     General: Bowel sounds are normal. There is no distension.     Palpations: Abdomen is soft.     Tenderness: There is no abdominal tenderness.  Musculoskeletal:        General: No swelling or deformity.  Skin:    General: Skin is warm and dry.  Neurological:     General: No focal deficit present.     Mental Status: Mental status is at baseline.    Labs on Admission: I have personally reviewed following labs and imaging studies  CBC: Recent Labs  Lab 04/28/23 1642 04/28/23 1653  WBC 12.3*  --   HGB 15.2 15.6  HCT 45.3 46.0  MCV 95.6  --   PLT 288  --     Basic Metabolic Panel: Recent Labs  Lab 04/28/23 1642 04/28/23 1653  NA 136 135  K 3.1* 3.2*  CL 102  --   CO2 18*  --   GLUCOSE 167*  --   BUN 7  --   CREATININE 1.12  --   CALCIUM 8.7*  --     GFR: CrCl cannot be calculated (Unknown ideal weight.).  Liver Function Tests: No results for input(s): "AST", "ALT", "ALKPHOS", "BILITOT", "PROT", "ALBUMIN" in the last 168 hours.  Urine analysis: No results found for: "COLORURINE", "APPEARANCEUR", "LABSPEC", "PHURINE", "GLUCOSEU", "HGBUR", "BILIRUBINUR", "KETONESUR", "PROTEINUR", "UROBILINOGEN", "NITRITE", "LEUKOCYTESUR"  Radiological Exams on Admission: DG Chest Portable 1 View Result Date: 04/28/2023 CLINICAL DATA:  Seizure-like activity followed by an episode of unresponsiveness. Atrial fibrillation. CPR. EXAM: PORTABLE CHEST 1 VIEW COMPARISON:  None Available. FINDINGS: Heart size and pulmonary vascularity are normal. Lungs are clear. No pleural effusions. No pneumothorax. Mediastinal contours appear intact. IMPRESSION: No active disease. Electronically Signed   By: Burman Nieves M.D.   On: 04/28/2023 17:16    EKG: Independently reviewed.  Atrial fibrillation with RVR at 133 bpm.  Nonspecific T wave changes.  Assessment/Plan Principal Problem:   Cardiac arrest [facility]) Active Problems:   Essential hypertension   Hyperlipidemia    Alcohol use   Cardiac arrest > Presenting after out-of-hospital cardiac arrest.  Rhythm strip reviewed by cardiology and EDP showing V. tach/V-fib.  This was what was identified on monitor by EMS and patient underwent defibrillation x 3 with ROSC achieved. > Patient has become alert.  Initially vision was slow to return but is returning. - Appreciate cardiology recommendations and assistance - Monitor on progressive unit - Echocardiogram - Replace potassium, check magnesium - On amiodarone as below  Atrial fibrillation with RVR > New onset after cardiac arrest as above. > Started on amiodarone infusion with continued elevated rates requiring some IV metoprolol in the ED. > No anticoagulation as far with CHA2DS2-VASc of 1. - Monitor on progressive unit as above - Appreciate cardiology recommendations and assistance - Continue  with amiodarone - Echocardiogram  Hypokalemia > Potassium noted to be 3.1 in the ED. > 40 mEq p.o. and 20 mEq IV ordered in the ED. - Check magnesium - Trend renal function and electrolytes  Hypertension - Holding amlodipine as may need different regimen with new onset arrhythmias  Alcohol use > History of alcohol use.  No history of withdrawal.  Was admitted in 2021 for several days without withdrawal, but continues to drink 19 beers a day. > Last drink today.  Ethanol positive in the ED. - CIWA with Librium detox protocol - Check exam, phosphorus - Multivitamin, thiamine, folate  ?Seizure > Possible seizure activity around the time of cardiac arrest.  Could be abnormal movements associated with/proceeding this. - EEG - Up with assistance for now  DVT prophylaxis: Lovenox Code Status:   Full Family Communication:  Updated at bedside  Disposition Plan:   Patient is from:  Home  Anticipated DC to:  Home  Anticipated DC date:  1 to 2 days  Anticipated DC barriers: None  Consults called:  Cardiology Admission status:  Observation,  progressive  Severity of Illness: The appropriate patient status for this patient is OBSERVATION. Observation status is judged to be reasonable and necessary in order to provide the required intensity of service to ensure the patient's safety. The patient's presenting symptoms, physical exam findings, and initial radiographic and laboratory data in the context of their medical condition is felt to place them at decreased risk for further clinical deterioration. Furthermore, it is anticipated that the patient will be medically stable for discharge from the hospital within 2 midnights of admission.    Synetta Fail MD [facility]  How to contact the [facility] Attending or Consulting provider 7A - 7P or covering provider during after hours 7P -7A, for this patient?   Check the care team in [facility] and look for a) attending/consulting [facility] provider listed and b) the [facility] team listed Log into [email or web address] and use [place]'s universal password to access. If you do not have the password, please contact the hospital operator. Locate the [facility] provider you are looking for under [facility] and page to a number that you can be directly reached. If you still have difficulty reaching the provider, please page the [facility] (Director on Call) for the Hospitalists listed on amion for assistance.  04/28/2023, 7:05 PM

## 2023-04-28 NOTE — Progress Notes (Signed)
PHARMACY - ANTICOAGULATION CONSULT NOTE  Pharmacy Consult for heparin Indication: atrial fibrillation  No Known Allergies  Patient Measurements:   Heparin Dosing Weight: TBW  Vital Signs: Temp: 98.1 F (36.7 C) (12/24 1708) Temp Source: Tympanic (12/24 1708) BP: 120/95 (12/24 1845) Pulse Rate: 119 (12/24 1845)  Labs: Recent Labs    04/28/23 1642 04/28/23 1653  HGB 15.2 15.6  HCT 45.3 46.0  PLT 288  --   CREATININE 1.12  --   TROPONINIHS 12  --     CrCl cannot be calculated (Unknown ideal weight.).   Medical History: Past Medical History:  Diagnosis Date   Carpal tunnel syndrome of right wrist 01/26/2019   Carpal tunnel syndrome, left 01/26/2019   Cellulitis 06/21/2019   Hypertension    Right carpal tunnel syndrome 01/26/2019   Viral illness 05/02/2020    Assessment: 37 YOM presenting post-Vfib arrest, now in Afib with RVR, he is not on anticoagulation PTA.  May require cath and to start heparin for now per cards.  Will omit bolus with CPR prior to arrival.   Goal of Therapy:  Heparin level 0.3-0.7 units/ml Monitor platelets by anticoagulation protocol: Yes   Plan:  Heparin gtt at 1100 units/hr, no bolus F/u 6 hour heparin level F/u cath plan and long term [facility] plan  Daylene Posey, PharmD, [facility] Clinical Pharmacist ED Pharmacist Phone # [phone] 04/28/2023 7:33 PM

## 2023-04-28 NOTE — ED Triage Notes (Signed)
Pt was eating at a family's house then had a seizure like activity then became unresponsive. Vfib on the monitor. Shocked 3 times. CPR 4 rounds. No epi given. ROSC achieved. Admits to 6 beers today. Afib RVR on the monitor with HR ranging from 130-190 bpm. Pt is now alert and oriented x 4. Complains of chest pressure. Admits to hearing voices and blurred vision. Only able to see bright lights initially.   EMS VS: Bp 158/106 CBG 199

## 2023-04-29 ENCOUNTER — Inpatient Hospital Stay (HOSPITAL_COMMUNITY): Payer: BC Managed Care – PPO

## 2023-04-29 ENCOUNTER — Encounter (HOSPITAL_COMMUNITY): Payer: Self-pay | Admitting: Internal Medicine

## 2023-04-29 ENCOUNTER — Observation Stay (HOSPITAL_COMMUNITY): Payer: BC Managed Care – PPO

## 2023-04-29 ENCOUNTER — Other Ambulatory Visit: Payer: Self-pay

## 2023-04-29 DIAGNOSIS — I472 Ventricular tachycardia, unspecified: Secondary | ICD-10-CM | POA: Diagnosis present

## 2023-04-29 DIAGNOSIS — I462 Cardiac arrest due to underlying cardiac condition: Secondary | ICD-10-CM | POA: Diagnosis present

## 2023-04-29 DIAGNOSIS — I2510 Atherosclerotic heart disease of native coronary artery without angina pectoris: Secondary | ICD-10-CM | POA: Diagnosis present

## 2023-04-29 DIAGNOSIS — Z825 Family history of asthma and other chronic lower respiratory diseases: Secondary | ICD-10-CM | POA: Diagnosis not present

## 2023-04-29 DIAGNOSIS — F1020 Alcohol dependence, uncomplicated: Secondary | ICD-10-CM | POA: Diagnosis present

## 2023-04-29 DIAGNOSIS — R569 Unspecified convulsions: Secondary | ICD-10-CM | POA: Diagnosis not present

## 2023-04-29 DIAGNOSIS — I4901 Ventricular fibrillation: Secondary | ICD-10-CM | POA: Diagnosis present

## 2023-04-29 DIAGNOSIS — F419 Anxiety disorder, unspecified: Secondary | ICD-10-CM | POA: Diagnosis present

## 2023-04-29 DIAGNOSIS — Z833 Family history of diabetes mellitus: Secondary | ICD-10-CM | POA: Diagnosis not present

## 2023-04-29 DIAGNOSIS — Z79899 Other long term (current) drug therapy: Secondary | ICD-10-CM | POA: Diagnosis not present

## 2023-04-29 DIAGNOSIS — E7800 Pure hypercholesterolemia, unspecified: Secondary | ICD-10-CM | POA: Diagnosis present

## 2023-04-29 DIAGNOSIS — I469 Cardiac arrest, cause unspecified: Secondary | ICD-10-CM | POA: Diagnosis not present

## 2023-04-29 DIAGNOSIS — E876 Hypokalemia: Secondary | ICD-10-CM | POA: Diagnosis present

## 2023-04-29 DIAGNOSIS — I428 Other cardiomyopathies: Secondary | ICD-10-CM | POA: Diagnosis present

## 2023-04-29 DIAGNOSIS — F17210 Nicotine dependence, cigarettes, uncomplicated: Secondary | ICD-10-CM | POA: Diagnosis present

## 2023-04-29 DIAGNOSIS — I4891 Unspecified atrial fibrillation: Secondary | ICD-10-CM | POA: Diagnosis not present

## 2023-04-29 DIAGNOSIS — I340 Nonrheumatic mitral (valve) insufficiency: Secondary | ICD-10-CM | POA: Diagnosis not present

## 2023-04-29 DIAGNOSIS — Z1389 Encounter for screening for other disorder: Secondary | ICD-10-CM | POA: Diagnosis not present

## 2023-04-29 DIAGNOSIS — Z950 Presence of cardiac pacemaker: Secondary | ICD-10-CM | POA: Diagnosis not present

## 2023-04-29 DIAGNOSIS — I5021 Acute systolic (congestive) heart failure: Secondary | ICD-10-CM | POA: Diagnosis present

## 2023-04-29 DIAGNOSIS — Z8249 Family history of ischemic heart disease and other diseases of the circulatory system: Secondary | ICD-10-CM | POA: Diagnosis not present

## 2023-04-29 DIAGNOSIS — I480 Paroxysmal atrial fibrillation: Secondary | ICD-10-CM | POA: Diagnosis present

## 2023-04-29 DIAGNOSIS — Z8241 Family history of sudden cardiac death: Secondary | ICD-10-CM | POA: Diagnosis not present

## 2023-04-29 DIAGNOSIS — Y902 Blood alcohol level of 40-59 mg/100 ml: Secondary | ICD-10-CM | POA: Diagnosis present

## 2023-04-29 DIAGNOSIS — Z7984 Long term (current) use of oral hypoglycemic drugs: Secondary | ICD-10-CM | POA: Diagnosis not present

## 2023-04-29 DIAGNOSIS — I110 Hypertensive heart disease with heart failure: Secondary | ICD-10-CM | POA: Diagnosis present

## 2023-04-29 DIAGNOSIS — Z95810 Presence of automatic (implantable) cardiac defibrillator: Secondary | ICD-10-CM | POA: Diagnosis not present

## 2023-04-29 LAB — HEPARIN LEVEL (UNFRACTIONATED)
Heparin Unfractionated: <0.1 [IU]/mL — ABNORMAL LOW (ref 0.30–0.70)
Heparin Unfractionated: 0.24 [IU]/mL — ABNORMAL LOW (ref 0.30–0.70)
Heparin Unfractionated: 0.31 [IU]/mL (ref 0.30–0.70)

## 2023-04-29 LAB — CBC
HCT: 45.5 % (ref 39.0–52.0)
Hemoglobin: 15.6 g/dL (ref 13.0–17.0)
MCH: 31.5 pg (ref 26.0–34.0)
MCHC: 34.3 g/dL (ref 30.0–36.0)
MCV: 91.9 fL (ref 80.0–100.0)
Platelets: 279 10*3/uL (ref 150–400)
RBC: 4.95 MIL/uL (ref 4.22–5.81)
RDW: 12.4 % (ref 11.5–15.5)
WBC: 10.6 10*3/uL — ABNORMAL HIGH (ref 4.0–10.5)
nRBC: 0 % (ref 0.0–0.2)

## 2023-04-29 LAB — COMPREHENSIVE METABOLIC PANEL
ALT: 33 U/L (ref 0–44)
AST: 71 U/L — ABNORMAL HIGH (ref 15–41)
Albumin: 3.9 g/dL (ref 3.5–5.0)
Alkaline Phosphatase: 65 U/L (ref 38–126)
Anion gap: 10 (ref 5–15)
BUN: 6 mg/dL (ref 6–20)
CO2: 22 mmol/L (ref 22–32)
Calcium: 9 mg/dL (ref 8.9–10.3)
Chloride: 103 mmol/L (ref 98–111)
Creatinine, Ser: 0.86 mg/dL (ref 0.61–1.24)
GFR, Estimated: >60 mL/min (ref >60)
Glucose, Bld: 116 mg/dL — ABNORMAL HIGH (ref 70–99)
Potassium: 3.8 mmol/L (ref 3.5–5.1)
Sodium: 135 mmol/L (ref 135–145)
Total Bilirubin: 0.9 mg/dL (ref <1.2)
Total Protein: 6.7 g/dL (ref 6.5–8.1)

## 2023-04-29 LAB — ECHOCARDIOGRAM COMPLETE
AR max vel: 3.17 cm2
AV Area VTI: 3.12 cm2
AV Area mean vel: 3.14 cm2
AV Mean grad: 1.8 mm[Hg]
AV Peak grad: 3.5 mm[Hg]
Ao pk vel: 0.93 m/s
Area-P 1/2: 6.07 cm2
Height: 73 in
S' Lateral: 4.3 cm
Weight: 3004.8 [oz_av]

## 2023-04-29 LAB — VITAMIN B12: Vitamin B-12: 181 pg/mL (ref 180–914)

## 2023-04-29 LAB — FOLATE: Folate: 10.7 ng/mL (ref >5.9)

## 2023-04-29 MED ORDER — VITAMIN B-12 1000 MCG PO TABS
1000.0000 ug | ORAL_TABLET | Freq: Every day | ORAL | Status: DC
  Administered 2023-04-30 – 2023-05-02 (×3): 1000 ug via ORAL
  Filled 2023-04-29 (×3): qty 1

## 2023-04-29 MED ORDER — SODIUM CHLORIDE 0.9 % WEIGHT BASED INFUSION
3.0000 mL/kg/h | INTRAVENOUS | Status: DC
  Administered 2023-04-30: 3 mL/kg/h via INTRAVENOUS

## 2023-04-29 MED ORDER — SODIUM CHLORIDE 0.9 % WEIGHT BASED INFUSION
1.0000 mL/kg/h | INTRAVENOUS | Status: DC
  Administered 2023-04-30: 1 mL/kg/h via INTRAVENOUS

## 2023-04-29 MED ORDER — POTASSIUM PHOSPHATES 15 MMOLE/5ML IV SOLN
20.0000 mmol | Freq: Once | INTRAVENOUS | Status: DC
  Filled 2023-04-29: qty 6.67

## 2023-04-29 MED ORDER — ASPIRIN 81 MG PO CHEW
81.0000 mg | CHEWABLE_TABLET | ORAL | Status: AC
  Administered 2023-04-30: 81 mg via ORAL
  Filled 2023-04-29: qty 1

## 2023-04-29 MED ORDER — THIAMINE MONONITRATE 100 MG PO TABS
100.0000 mg | ORAL_TABLET | Freq: Every day | ORAL | Status: DC
  Administered 2023-04-29 – 2023-05-02 (×4): 100 mg via ORAL
  Filled 2023-04-29 (×4): qty 1

## 2023-04-29 MED ORDER — POTASSIUM PHOSPHATES 15 MMOLE/5ML IV SOLN
20.0000 mmol | Freq: Once | INTRAVENOUS | Status: DC
Start: 2023-04-29 — End: 2023-04-29
  Filled 2023-04-29: qty 6.67

## 2023-04-29 MED ORDER — K PHOS MONO-SOD PHOS DI & MONO 155-852-130 MG PO TABS
250.0000 mg | ORAL_TABLET | Freq: Three times a day (TID) | ORAL | Status: DC
Start: 2023-04-29 — End: 2023-05-02
  Administered 2023-04-29 – 2023-05-02 (×9): 250 mg via ORAL
  Filled 2023-04-29 (×12): qty 1

## 2023-04-29 MED ORDER — METOPROLOL TARTRATE 50 MG PO TABS
50.0000 mg | ORAL_TABLET | Freq: Four times a day (QID) | ORAL | Status: DC
  Administered 2023-04-29 – 2023-04-30 (×8): 50 mg via ORAL
  Filled 2023-04-29 (×8): qty 1

## 2023-04-29 MED ORDER — CYANOCOBALAMIN 1000 MCG/ML IJ SOLN
1000.0000 ug | Freq: Once | INTRAMUSCULAR | Status: AC
  Administered 2023-04-29: 1000 ug via INTRAMUSCULAR
  Filled 2023-04-29: qty 1

## 2023-04-29 NOTE — Plan of Care (Signed)

## 2023-04-29 NOTE — Procedures (Signed)
Patient Name: Warren Ross  MRN: [id number]  Epilepsy Attending: Charlsie Quest  Referring Physician/Provider: Synetta Fail, MD  Date: 04/29/2023 Duration: 22.43 mins  Patient history: 37yo M s/p cardiac arrest getting eeg to evaluate for seizure  Level of alertness: Awake  AEDs during EEG study: Librium  Technical aspects: This EEG study was done with scalp electrodes positioned according to the 10-20 International system of electrode placement. Electrical activity was reviewed with band pass filter of 1-70Hz , sensitivity of 7 uV/mm, display speed of 18mm/sec with a 60Hz  notched filter applied as appropriate. EEG data were recorded continuously and digitally stored.  Video monitoring was available and reviewed as appropriate.  Description: The posterior dominant rhythm consists of 8 Hz activity of moderate voltage (25-35 uV) seen predominantly in posterior head regions, symmetric and reactive to eye opening and eye closing.  There is an excessive amount of 15 to 18 Hz beta activity distributed symmetrically and diffusely. Physiologic photic driving was seen during photic stimulation.  Hyperventilation was not performed.     ABNORMALITY - Excessive beta, generalized  IMPRESSION: This study is within normal limits. The excessive beta activity seen in the background is most likely due to the effect of benzodiazepine and is a benign EEG pattern. No seizures or epileptiform discharges were seen throughout the recording.  A normal interictal EEG does not exclude the diagnosis of epilepsy.  Parveen Freehling Annabelle Harman

## 2023-04-29 NOTE — Progress Notes (Signed)
PHARMACY - ANTICOAGULATION CONSULT NOTE  Pharmacy Consult for heparin Indication: atrial fibrillation  Labs: Recent Labs    04/28/23 1642 04/28/23 1653 04/28/23 1846 04/29/23 0400  HGB 15.2 15.6  --  15.6  HCT 45.3 46.0  --  45.5  PLT 288  --   --  279  HEPARINUNFRC  --   --   --  <0.10*  CREATININE 1.12  --   --   --   TROPONINIHS 12  --  217*  --    Assessment: 37yo male subtherapeutic on heparin with initial dosing for Afib; no infusion issues or signs of bleeding per RN.  Goal of Therapy:  Heparin level 0.3-0.7 units/ml   Plan:  Increase heparin infusion by 3-4 units/kg/hr to 1400 units/hr. Check level in 6 hours.   Vernard Gambles, PharmD, BCPS 04/29/2023 4:53 AM

## 2023-04-29 NOTE — H&P (View-Only) (Signed)
   Patient Name: MAHD CLICK Date of Encounter: 04/29/2023 [facility] Cardiologist: None Croituro  Interval Summary  .    Sore chest  Vital Signs .    Vitals:   04/28/23 2022 04/28/23 2152 04/28/23 2221 04/29/23 0652  BP:    (!) 142/91  Pulse: (!) 120   (!) 110  Resp:    16  Temp:  98.1 F (36.7 C) 98.1 F (36.7 C) 97.8 F (36.6 C)  TempSrc:    Oral  SpO2:   97% 98%  Weight:    85.2 kg  Height:        Intake/Output Summary (Last 24 hours) at 04/29/2023 0851 Last data filed at 04/29/2023 0359 Gross per 24 hour  Intake 765.18 ml  Output --  Net 765.18 ml      04/29/2023    6:52 AM 04/28/2023    7:00 PM 01/29/2022    2:58 PM  Last 3 Weights  Weight (lbs) 187 lb 12.8 oz 190 lb 4.8 oz 184 lb 12.8 oz  Weight (kg) 85.186 kg 86.32 kg 83.825 kg      Telemetry/ECG    AFIB 130's - Personally Reviewed  Physical Exam .   GEN: No acute distress.   Neck: No JVD Cardiac: Irreg tachy, no murmurs, rubs, or gallops.  Respiratory: Clear to auscultation bilaterally. GI: Soft, nontender, non-distended  MS: No edema  Assessment & Plan .     37 year old cardiac arrest, VT, shock in field, CPR, father with SCD due to cardiomyopathy at 79  -AMIO IV, metop 50 4 times a day to improve rate.  -LHC tomorrow, NPO past midnight. Risks and benefits discussed, stroke, MI, renal impairment. -Cardiac MRI to follow -ETOH 18 beers a day - CIWA -possible familial or ETOH or CAD related arrest.    For questions or updates, please contact [facility] Please consult [email or web address] for contact info under        Signed, Donato Schultz, MD

## 2023-04-29 NOTE — Progress Notes (Signed)
   Patient Name: Warren Ross Date of Encounter: 04/29/2023 [facility] Cardiologist: None Croituro  Interval Summary  .    Sore chest  Vital Signs .    Vitals:   04/28/23 2022 04/28/23 2152 04/28/23 2221 04/29/23 0652  BP:    (!) 142/91  Pulse: (!) 120   (!) 110  Resp:    16  Temp:  98.1 F (36.7 C) 98.1 F (36.7 C) 97.8 F (36.6 C)  TempSrc:    Oral  SpO2:   97% 98%  Weight:    85.2 kg  Height:        Intake/Output Summary (Last 24 hours) at 04/29/2023 0851 Last data filed at 04/29/2023 0359 Gross per 24 hour  Intake 765.18 ml  Output --  Net 765.18 ml      04/29/2023    6:52 AM 04/28/2023    7:00 PM 01/29/2022    2:58 PM  Last 3 Weights  Weight (lbs) 187 lb 12.8 oz 190 lb 4.8 oz 184 lb 12.8 oz  Weight (kg) 85.186 kg 86.32 kg 83.825 kg      Telemetry/ECG    AFIB 130's - Personally Reviewed  Physical Exam .   GEN: No acute distress.   Neck: No JVD Cardiac: Irreg tachy, no murmurs, rubs, or gallops.  Respiratory: Clear to auscultation bilaterally. GI: Soft, nontender, non-distended  MS: No edema  Assessment & Plan .     37 year old cardiac arrest, VT, shock in field, CPR, father with SCD due to cardiomyopathy at 79  -AMIO IV, metop 50 4 times a day to improve rate.  -LHC tomorrow, NPO past midnight. Risks and benefits discussed, stroke, MI, renal impairment. -Cardiac MRI to follow -ETOH 18 beers a day - CIWA -possible familial or ETOH or CAD related arrest.    For questions or updates, please contact [facility] Please consult [email or web address] for contact info under        Signed, Donato Schultz, MD

## 2023-04-29 NOTE — Progress Notes (Addendum)
PHARMACY - ANTICOAGULATION CONSULT NOTE  Pharmacy Consult for heparin Indication: atrial fibrillation  No Known Allergies  Patient Measurements: Height: 6\' 1"  (185.4 cm) Weight: 85.2 kg (187 lb 12.8 oz) IBW/kg (Calculated) : 79.9 Heparin Dosing Weight: TBW  Vital Signs: Temp: 97.8 F (36.6 C) (12/25 0652) Temp Source: Oral (12/25 0652) BP: 142/91 (12/25 0652) Pulse Rate: 110 (12/25 0652)  Labs: Recent Labs    04/28/23 1642 04/28/23 1653 04/28/23 1846 04/29/23 0400 04/29/23 1109  HGB 15.2 15.6  --  15.6  --   HCT 45.3 46.0  --  45.5  --   PLT 288  --   --  279  --   HEPARINUNFRC  --   --   --  <0.10* 0.24*  CREATININE 1.12  --   --  0.86  --   TROPONINIHS 12  --  217*  --   --     Estimated Creatinine Clearance: 130.3 mL/min (by C-G formula based on SCr of 0.86 mg/dL).   Medical History: Past Medical History:  Diagnosis Date   Carpal tunnel syndrome of right wrist 01/26/2019   Carpal tunnel syndrome, left 01/26/2019   Cellulitis 06/21/2019   Hypertension    Right carpal tunnel syndrome 01/26/2019   Viral illness 05/02/2020    Assessment: 37 YOM presenting post-Vfib arrest, now in Afib with RVR, he is not on anticoagulation PTA. Per cards, cath planned for tomorrow 12/26.  Heparin level subtherapeutic at 0.24 on 1400 units/hr. CBC stable. No issues with infusion or signs of bleeding per RN.   Goal of Therapy:  Heparin level 0.3-0.7 units/ml Monitor platelets by anticoagulation protocol: Yes   Plan:  Increase heparin to 1600 units/hr  F/u 6 hour heparin level Monitor CBC and for s/sx of bleeding  F/u cath plan and long term AC plan  12/25 PM: Heparin level 0.31, borderline subtherapeutic on heparin 1600 units/hr. Increase heparin to 1650 units/hr. F/u heparin level with AM labs   Thank you for involving pharmacy in the patient's care.   Enos Fling, PharmD PGY-1 Acute Care Pharmacy Resident 04/29/2023 7:13 PM

## 2023-04-29 NOTE — Progress Notes (Signed)
EEG complete - results pending 

## 2023-04-29 NOTE — Progress Notes (Signed)
PROGRESS NOTE    Warren Ross  WUJ:[id number] DOB: [birth year] DOA: 04/28/2023 PCP: Lula Olszewski, MD    Brief Narrative:  37 year old with history of hypertension, hyperlipidemia, heavy beer drinker was at dinner with family, sudden unresponsiveness and CPR initiated by family members, EMS called and he was found with V-fib arrest, defibrillated x 3 and ROSC was achieved.  Admitted to the hospital with sinus rhythm, developed A-fib overnight.  Stabilizing.  Subjective: Patient seen and examined.  Wife and brother at the bedside.  Patient is having some soreness in his chest but denies any other complaints.  He has multiple questions about consuming alcohol, smoking, drinking soda.  Patient tells me that he is not going to develop any alcohol withdrawal. Assessment & Plan:   Sudden cardiac death/V-fib arrest status post defibrillation and ROSC. Family history of sudden cardiac death due to cardiomyopathy. New onset A-fib with RVR.  Currently hemodynamically stable.  Remains on amiodarone infusion.  Added metoprolol.  Electrolytes are adequate.  Followed by cardiology.  For cardiac cath tomorrow.  For cardiac MRI today.  Alcoholism with risk of withdrawal: Drinks about 18 beers a day every day, 24 beers on weekends.  Last drink was yesterday afternoon.  High risk of alcohol withdrawal.  Currently stable. On Librium tapering protocol Seizure precautions, delirium precautions. Multivitamins. Will check B1/B6/B12 and folic acid levels.  Electrolytes: Hypokalemia, replaced Hypophosphatemia, will replace  Essential hypertension: On amlodipine at home.  Holding.   DVT prophylaxis: SCDs   Code Status: Full code Family Communication: Wife at the bedside Disposition Plan: Status is: Inpatient Remains inpatient appropriate because: Vasoactive infusions     Consultants:  Cardiology  Procedures:  None  Antimicrobials:  None     Objective: Vitals:   04/28/23 2022  04/28/23 2152 04/28/23 2221 04/29/23 0652  BP:    (!) 142/91  Pulse: (!) 120   (!) 110  Resp:    16  Temp:  98.1 F (36.7 C) 98.1 F (36.7 C) 97.8 F (36.6 C)  TempSrc:    Oral  SpO2:   97% 98%  Weight:    85.2 kg  Height:        Intake/Output Summary (Last 24 hours) at 04/29/2023 1133 Last data filed at 04/29/2023 0359 Gross per 24 hour  Intake 765.18 ml  Output --  Net 765.18 ml   Filed Weights   04/28/23 1900 04/29/23 0652  Weight: 86.3 kg 85.2 kg    Examination:  General exam: Appears calm and comfortable  Respiratory system: Clear to auscultation. Respiratory effort normal. Cardiovascular system: S1 & S2 heard, RRR. No JVD, murmurs, rubs, gallops or clicks. No pedal edema. Gastrointestinal system: Abdomen is nondistended, soft and nontender. No organomegaly or masses felt. Normal bowel sounds heard. Central nervous system: Alert and oriented. No focal neurological deficits. Extremities: Symmetric 5 x 5 power. Skin: No rashes, lesions or ulcers Psychiatry: Judgement and insight appear normal. Mood & affect appropriate.     Data Reviewed: I have personally reviewed following labs and imaging studies  CBC: Recent Labs  Lab 04/28/23 1642 04/28/23 1653 04/29/23 0400  WBC 12.3*  --  10.6*  HGB 15.2 15.6 15.6  HCT 45.3 46.0 45.5  MCV 95.6  --  91.9  PLT 288  --  279   Basic Metabolic Panel: Recent Labs  Lab 04/28/23 1642 04/28/23 1653 04/28/23 1811 04/28/23 2022 04/29/23 0400  NA 136 135  --   --  135  K 3.1* 3.2*  --   --  3.8  CL 102  --   --   --  103  CO2 18*  --   --   --  22  GLUCOSE 167*  --   --   --  116*  BUN 7  --   --   --  6  CREATININE 1.12  --   --   --  0.86  CALCIUM 8.7*  --   --   --  9.0  MG  --   --  1.8  --   --   PHOS  --   --   --  2.4*  --    GFR: Estimated Creatinine Clearance: 130.3 mL/min (by C-G formula based on SCr of 0.86 mg/dL). Liver Function Tests: Recent Labs  Lab 04/29/23 0400  AST 71*  ALT 33  ALKPHOS 65   BILITOT 0.9  PROT 6.7  ALBUMIN 3.9   No results for input(s): "LIPASE", "AMYLASE" in the last 168 hours. No results for input(s): "AMMONIA" in the last 168 hours. Coagulation Profile: No results for input(s): "INR", "PROTIME" in the last 168 hours. Cardiac Enzymes: No results for input(s): "CKTOTAL", "CKMB", "CKMBINDEX", "TROPONINI" in the last 168 hours. BNP (last 3 results) No results for input(s): "PROBNP" in the last 8760 hours. HbA1C: No results for input(s): "HGBA1C" in the last 72 hours. CBG: No results for input(s): "GLUCAP" in the last 168 hours. Lipid Profile: No results for input(s): "CHOL", "HDL", "LDLCALC", "TRIG", "CHOLHDL", "LDLDIRECT" in the last 72 hours. Thyroid Function Tests: No results for input(s): "TSH", "T4TOTAL", "FREET4", "T3FREE", "THYROIDAB" in the last 72 hours. Anemia Panel: Recent Labs    04/29/23 0817  VITAMINB12 181  FOLATE 10.7   Sepsis Labs: Recent Labs  Lab 04/28/23 1705 04/28/23 1848  LATICACIDVEN 5.7* 1.4    No results found for this or any previous visit (from the past 240 hours).       Radiology Studies: ECHOCARDIOGRAM COMPLETE Result Date: 04/29/2023    ECHOCARDIOGRAM REPORT   Patient Name:   Warren Ross Date of Exam: 04/29/2023 Medical Rec #:  [id number]         Height:       73.0 in Accession #:    [id number]        Weight:       187.8 lb Date of Birth:  Jul 10, 1985         BSA:          2.095 m Patient Age:    37 years          BP:           142/91 mmHg Patient Gender: M                 HR:           120 bpm. Exam Location:  Inpatient Procedure: 2D Echo, 3D Echo, Cardiac Doppler and Color Doppler Indications:    Cardiac arrest  History:        Patient has no prior history of Echocardiogram examinations.                 ETOH abuse, Arrythmias:Atrial Fibrillation; Risk                 Factors:Hypertension, Dyslipidemia and Current Smoker.  Sonographer:    Dondra Prader RVT RCS Referring Phys: Synetta Fail  Sonographer  Comments: HR from 120 bpm to 130bpm IMPRESSIONS  1. Left ventricular ejection fraction, by estimation, is 25 to 30%.  The left ventricle has severely decreased function. The left ventricle demonstrates global hypokinesis. Indeterminate diastolic filling due to E-A fusion.  2. Right ventricular systolic function is mildly reduced. The right ventricular size is normal.  3. The mitral valve is normal in structure. Trivial mitral valve regurgitation. No evidence of mitral stenosis.  4. The aortic valve is normal in structure. Aortic valve regurgitation is not visualized. No aortic stenosis is present.  5. The inferior vena cava is dilated in size with >50% respiratory variability, suggesting right atrial pressure of 8 mmHg. FINDINGS  Left Ventricle: Left ventricular ejection fraction, by estimation, is 25 to 30%. The left ventricle has severely decreased function. The left ventricle demonstrates global hypokinesis. The left ventricular internal cavity size was normal in size. There is no left ventricular hypertrophy. Indeterminate diastolic filling due to E-A fusion. Right Ventricle: The right ventricular size is normal. No increase in right ventricular wall thickness. Right ventricular systolic function is mildly reduced. Left Atrium: Left atrial size was normal in size. Right Atrium: Right atrial size was normal in size. Pericardium: There is no evidence of pericardial effusion. Mitral Valve: The mitral valve is normal in structure. Trivial mitral valve regurgitation. No evidence of mitral valve stenosis. Tricuspid Valve: The tricuspid valve is normal in structure. Tricuspid valve regurgitation is trivial. No evidence of tricuspid stenosis. Aortic Valve: The aortic valve is normal in structure. Aortic valve regurgitation is not visualized. No aortic stenosis is present. Aortic valve mean gradient measures 1.8 mmHg. Aortic valve peak gradient measures 3.5 mmHg. Aortic valve area, by VTI measures 3.12 cm. Pulmonic  Valve: The pulmonic valve was normal in structure. Pulmonic valve regurgitation is not visualized. No evidence of pulmonic stenosis. Aorta: The aortic root is normal in size and structure. Venous: The inferior vena cava is dilated in size with greater than 50% respiratory variability, suggesting right atrial pressure of 8 mmHg. IAS/Shunts: No atrial level shunt detected by color flow Doppler.  LEFT VENTRICLE PLAX 2D LVIDd:         5.10 cm   Diastology LVIDs:         4.30 cm   LV e' medial:    8.59 cm/s LV PW:         1.00 cm   LV E/e' medial:  6.5 LV IVS:        1.00 cm   LV e' lateral:   17.50 cm/s LVOT diam:     2.20 cm   LV E/e' lateral: 3.2 LV SV:         36 LV SV Index:   17 LVOT Area:     3.80 cm                           3D Volume EF:                          3D EF:        31 %                          LV EDV:       149 ml                          LV ESV:       103 ml  LV SV:        47 ml RIGHT VENTRICLE RV S prime:     9.14 cm/s TAPSE (M-mode): 2.1 cm LEFT ATRIUM             Index        RIGHT ATRIUM           Index LA diam:        3.30 cm 1.58 cm/m   RA Area:     14.90 cm LA Vol (A2C):   48.0 ml 22.91 ml/m  RA Volume:   40.80 ml  19.48 ml/m LA Vol (A4C):   35.8 ml 17.09 ml/m LA Biplane Vol: 43.0 ml 20.53 ml/m  AORTIC VALVE                    PULMONIC VALVE AV Area (Vmax):    3.17 cm     PV Vmax:       0.77 m/s AV Area (Vmean):   3.14 cm     PV Peak grad:  2.4 mmHg AV Area (VTI):     3.12 cm AV Vmax:           92.94 cm/s AV Vmean:          61.300 cm/s AV VTI:            0.114 m AV Peak Grad:      3.5 mmHg AV Mean Grad:      1.8 mmHg LVOT Vmax:         77.40 cm/s LVOT Vmean:        50.633 cm/s LVOT VTI:          0.094 m LVOT/AV VTI ratio: 0.82  AORTA Ao Root diam: 3.00 cm Ao Asc diam:  3.20 cm MITRAL VALVE MV Area (PHT): 6.07 cm    SHUNTS MV Decel Time: 125 msec    Systemic VTI:  0.09 m MV E velocity: 56.10 cm/s  Systemic Diam: 2.20 cm MV A velocity: 47.70 cm/s MV E/A ratio:   1.18 Arvilla Meres MD Electronically signed by Arvilla Meres MD Signature Date/Time: 04/29/2023/10:18:46 AM    Final    DG Chest Portable 1 View Result Date: 04/28/2023 CLINICAL DATA:  Seizure-like activity followed by an episode of unresponsiveness. Atrial fibrillation. CPR. EXAM: PORTABLE CHEST 1 VIEW COMPARISON:  None Available. FINDINGS: Heart size and pulmonary vascularity are normal. Lungs are clear. No pleural effusions. No pneumothorax. Mediastinal contours appear intact. IMPRESSION: No active disease. Electronically Signed   By: Burman Nieves M.D.   On: 04/28/2023 17:16        Scheduled Meds:  chlordiazePOXIDE  25 mg Oral QID   Followed by   Melene Muller ON 04/30/2023] chlordiazePOXIDE  25 mg Oral TID   Followed by   Melene Muller ON 05/01/2023] chlordiazePOXIDE  25 mg Oral BH-qamhs   Followed by   Melene Muller ON 05/02/2023] chlordiazePOXIDE  25 mg Oral Daily   metoprolol tartrate  50 mg Oral QID   multivitamin with minerals  1 tablet Oral Daily   nicotine  21 mg Transdermal Daily   phosphorus  250 mg Oral TID   sodium chloride flush  3 mL Intravenous Q12H   Continuous Infusions:  amiodarone 30 mg/hr (04/29/23 0624)   heparin 1,400 Units/hr (04/29/23 0623)     LOS: 0 days    Time spent: 50 minutes    Dorcas Carrow, MD [facility]

## 2023-04-30 ENCOUNTER — Other Ambulatory Visit (HOSPITAL_COMMUNITY): Payer: Self-pay

## 2023-04-30 ENCOUNTER — Telehealth (HOSPITAL_COMMUNITY): Payer: Self-pay | Admitting: Pharmacy Technician

## 2023-04-30 ENCOUNTER — Encounter (HOSPITAL_COMMUNITY)
Admission: EM | Disposition: A | Discharge status: Home / Self Care | Payer: Self-pay | Source: Home / Self Care | Attending: Internal Medicine

## 2023-04-30 ENCOUNTER — Encounter (HOSPITAL_COMMUNITY): Payer: Self-pay | Admitting: Internal Medicine

## 2023-04-30 ENCOUNTER — Inpatient Hospital Stay (HOSPITAL_COMMUNITY): Payer: BC Managed Care – PPO

## 2023-04-30 DIAGNOSIS — Z720 Tobacco use: Secondary | ICD-10-CM

## 2023-04-30 DIAGNOSIS — I4891 Unspecified atrial fibrillation: Secondary | ICD-10-CM | POA: Diagnosis not present

## 2023-04-30 DIAGNOSIS — I428 Other cardiomyopathies: Secondary | ICD-10-CM

## 2023-04-30 DIAGNOSIS — I469 Cardiac arrest, cause unspecified: Secondary | ICD-10-CM | POA: Diagnosis not present

## 2023-04-30 DIAGNOSIS — E785 Hyperlipidemia, unspecified: Secondary | ICD-10-CM

## 2023-04-30 HISTORY — PX: LEFT HEART CATH AND CORONARY ANGIOGRAPHY: CATH118249

## 2023-04-30 LAB — HEPARIN LEVEL (UNFRACTIONATED)
Heparin Unfractionated: 0.36 [IU]/mL (ref 0.30–0.70)
Heparin Unfractionated: <0.1 [IU]/mL — ABNORMAL LOW (ref 0.30–0.70)

## 2023-04-30 LAB — HEMOGLOBIN A1C
Hgb A1c MFr Bld: 5.4 % (ref 4.8–5.6)
Mean Plasma Glucose: 108 mg/dL

## 2023-04-30 LAB — LIPID PANEL
Cholesterol: 149 mg/dL (ref 0–200)
HDL: 60 mg/dL (ref >40)
LDL Cholesterol: 71 mg/dL (ref 0–99)
Total CHOL/HDL Ratio: 2.5 {ratio}
Triglycerides: 91 mg/dL (ref <150)
VLDL: 18 mg/dL (ref 0–40)

## 2023-04-30 LAB — CBC
HCT: 49.3 % (ref 39.0–52.0)
Hemoglobin: 17.2 g/dL — ABNORMAL HIGH (ref 13.0–17.0)
MCH: 32.1 pg (ref 26.0–34.0)
MCHC: 34.9 g/dL (ref 30.0–36.0)
MCV: 92.1 fL (ref 80.0–100.0)
Platelets: 248 10*3/uL (ref 150–400)
RBC: 5.35 MIL/uL (ref 4.22–5.81)
RDW: 12.4 % (ref 11.5–15.5)
WBC: 10.2 10*3/uL (ref 4.0–10.5)
nRBC: 0 % (ref 0.0–0.2)

## 2023-04-30 LAB — COMPREHENSIVE METABOLIC PANEL
ALT: 34 U/L (ref 0–44)
AST: 69 U/L — ABNORMAL HIGH (ref 15–41)
Albumin: 3.7 g/dL (ref 3.5–5.0)
Alkaline Phosphatase: 60 U/L (ref 38–126)
Anion gap: 11 (ref 5–15)
BUN: 7 mg/dL (ref 6–20)
CO2: 20 mmol/L — ABNORMAL LOW (ref 22–32)
Calcium: 9.1 mg/dL (ref 8.9–10.3)
Chloride: 105 mmol/L (ref 98–111)
Creatinine, Ser: 0.91 mg/dL (ref 0.61–1.24)
GFR, Estimated: >60 mL/min (ref >60)
Glucose, Bld: 96 mg/dL (ref 70–99)
Potassium: 3.8 mmol/L (ref 3.5–5.1)
Sodium: 136 mmol/L (ref 135–145)
Total Bilirubin: 0.6 mg/dL (ref <1.2)
Total Protein: 6.6 g/dL (ref 6.5–8.1)

## 2023-04-30 LAB — MAGNESIUM: Magnesium: 1.7 mg/dL (ref 1.7–2.4)

## 2023-04-30 LAB — PHOSPHORUS: Phosphorus: 3.1 mg/dL (ref 2.5–4.6)

## 2023-04-30 SURGERY — LEFT HEART CATH AND CORONARY ANGIOGRAPHY
Anesthesia: LOCAL

## 2023-04-30 MED ORDER — LIDOCAINE HCL (PF) 1 % IJ SOLN
INTRAMUSCULAR | Status: DC | PRN
  Administered 2023-04-30: 2 mL

## 2023-04-30 MED ORDER — SACUBITRIL-VALSARTAN 24-26 MG PO TABS
1.0000 | ORAL_TABLET | Freq: Two times a day (BID) | ORAL | Status: DC
  Administered 2023-04-30 – 2023-05-02 (×5): 1 via ORAL
  Filled 2023-04-30 (×5): qty 1

## 2023-04-30 MED ORDER — VERAPAMIL HCL 2.5 MG/ML IV SOLN
INTRAVENOUS | Status: AC
  Filled 2023-04-30: qty 2

## 2023-04-30 MED ORDER — HEPARIN SODIUM (PORCINE) 1000 UNIT/ML IJ SOLN
INTRAMUSCULAR | Status: AC
  Filled 2023-04-30: qty 10

## 2023-04-30 MED ORDER — MIDAZOLAM HCL 2 MG/2ML IJ SOLN
INTRAMUSCULAR | Status: DC | PRN
  Administered 2023-04-30: 2 mg via INTRAVENOUS

## 2023-04-30 MED ORDER — FENTANYL CITRATE (PF) 100 MCG/2ML IJ SOLN
INTRAMUSCULAR | Status: DC | PRN
  Administered 2023-04-30: 50 ug via INTRAVENOUS

## 2023-04-30 MED ORDER — SODIUM CHLORIDE 0.9 % IV SOLN: INTRAVENOUS | Status: AC

## 2023-04-30 MED ORDER — HEPARIN (PORCINE) 25000 UT/250ML-% IV SOLN: 1650.0000 [IU]/h | INTRAVENOUS | Status: DC

## 2023-04-30 MED ORDER — VERAPAMIL HCL 2.5 MG/ML IV SOLN
INTRAVENOUS | Status: DC | PRN
  Administered 2023-04-30: 10 mL via INTRA_ARTERIAL

## 2023-04-30 MED ORDER — HEPARIN SODIUM (PORCINE) 1000 UNIT/ML IJ SOLN
INTRAMUSCULAR | Status: DC | PRN
  Administered 2023-04-30: 4000 [IU] via INTRAVENOUS

## 2023-04-30 MED ORDER — MIDAZOLAM HCL 2 MG/2ML IJ SOLN
INTRAMUSCULAR | Status: AC
  Filled 2023-04-30: qty 2

## 2023-04-30 MED ORDER — POTASSIUM CHLORIDE CRYS ER 20 MEQ PO TBCR
20.0000 meq | EXTENDED_RELEASE_TABLET | Freq: Once | ORAL | Status: AC
  Administered 2023-04-30: 20 meq via ORAL
  Filled 2023-04-30: qty 1

## 2023-04-30 MED ORDER — LIDOCAINE HCL (PF) 1 % IJ SOLN
INTRAMUSCULAR | Status: AC
  Filled 2023-04-30: qty 30

## 2023-04-30 MED ORDER — MAGNESIUM SULFATE 4 GM/100ML IV SOLN
4.0000 g | Freq: Once | INTRAVENOUS | Status: AC
  Administered 2023-04-30: 4 g via INTRAVENOUS
  Filled 2023-04-30: qty 100

## 2023-04-30 MED ORDER — IOHEXOL 350 MG/ML SOLN
INTRAVENOUS | Status: DC | PRN
  Administered 2023-04-30: 25 mL

## 2023-04-30 MED ORDER — SPIRONOLACTONE 12.5 MG HALF TABLET
12.5000 mg | ORAL_TABLET | Freq: Every day | ORAL | Status: DC
  Administered 2023-04-30 – 2023-05-02 (×3): 12.5 mg via ORAL
  Filled 2023-04-30 (×3): qty 1

## 2023-04-30 MED ORDER — HEPARIN (PORCINE) IN NACL 1000-0.9 UT/500ML-% IV SOLN
INTRAVENOUS | Status: DC | PRN
  Administered 2023-04-30 (×2): 500 mL

## 2023-04-30 MED ORDER — HEPARIN (PORCINE) 25000 UT/250ML-% IV SOLN
1650.0000 [IU]/h | INTRAVENOUS | Status: DC
  Administered 2023-04-30: 1650 [IU]/h via INTRAVENOUS

## 2023-04-30 MED ORDER — FENTANYL CITRATE (PF) 100 MCG/2ML IJ SOLN
INTRAMUSCULAR | Status: AC
  Filled 2023-04-30: qty 2

## 2023-04-30 SURGICAL SUPPLY — 9 items
CATH INFINITI AMBI 5FR TG (CATHETERS) IMPLANT
DEVICE RAD COMP TR BAND LRG (VASCULAR PRODUCTS) IMPLANT
ELECT DEFIB PAD ADLT CADENCE (PAD) IMPLANT
GLIDESHEATH SLEND A-KIT 6F 22G (SHEATH) IMPLANT
GUIDEWIRE INQWIRE 1.5J.035X260 (WIRE) IMPLANT
INQWIRE 1.5J .035X260CM (WIRE) ×1
KIT SYRINGE INJ CVI SPIKEX1 (MISCELLANEOUS) IMPLANT
PACK CARDIAC CATHETERIZATION (CUSTOM PROCEDURE TRAY) ×1 IMPLANT
SET ATX-X65L (MISCELLANEOUS) IMPLANT

## 2023-04-30 NOTE — Progress Notes (Signed)
   Heart Failure Stewardship Pharmacist Progress Note   PCP: Lula Olszewski, MD PCP-Cardiologist: None    HPI:  37 yo M with PMH of HTN, HLD, anxiety, alcohol use (12-18 beers per day), and tobacco use.   Presented to the ED on 12/24 with vfib arrest. Family reports he had seizure-like activity prior to arrest. He was defibrillated three times and underwent 4 rounds of CPR. Was responsive after ROSC. EKG noted afib RVR in the ED. Reports family history of heart disease - father had a sudden cardiac arrest at age 15. CXR with no active disease. ECHO 12/25 with LVEF 25-30%, global hypokinesis, RV mildly reduced, trivial MR. LHC on 12/26 consistent with NICM.   Current HF Medications: Beta Blocker: metoprolol tartrate 50 mg 4 times daily ACE/ARB/ARNI: Entresto 24/26 mg BID MRA: spironolactone 12.5 mg daily  Prior to admission HF Medications: None  Pertinent Lab Values: Serum creatinine 0.91, BUN 7, Potassium 3.8, Sodium 136, Magnesium 1.7   Vital Signs: Weight: 187 lbs (admission weight: 190 lbs) Blood pressure: 120/90s  Heart rate: 80-100s  I/O: incomplete  Medication Assistance / Insurance Benefits Check: Does the patient have prescription insurance?  Yes Type of insurance plan: [employer] risk [employer]  Outpatient Pharmacy:  Prior to admission outpatient pharmacy: [facility] Is the patient willing to use [facility] TOC pharmacy at discharge? Yes Is the patient willing to transition their outpatient pharmacy to utilize a [facility] outpatient pharmacy?   Pending    Assessment: 1. 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 NYHA class II symptoms. - Not volume overloaded on exam. Keep K>4 and Mg>2. KCl 20 mEq x 1 and magnesium 4g IV x 1 ordered for replacement.  - Continue metoprolol tartrate 50 mg 4 times daily - will need to consolidate to XL prior to discharge - Agree with adding Entresto 24/26 mg BID and spironolactone 12.5 mg daily   Plan: 1) Medication changes recommended  at this time: - Agree with changes  2) Patient assistance: - Has a deductible remaining on his insurance. Initial copays >$500. Entresto copay card can be used toward the deductible, lowering the copay to $10.  3)  Education  - To be completed prior to discharge  Sharen Hones, PharmD, BCPS Heart Failure Stewardship Pharmacist Phone [phone]

## 2023-04-30 NOTE — Progress Notes (Signed)
PROGRESS NOTE    GUADALUPE NUSE  WUJ:[id number] DOB: [birth year] DOA: 04/28/2023 PCP: Lula Olszewski, MD    Brief Narrative:  37 year old with history of hypertension, hyperlipidemia, heavy beer drinker was at dinner with family, sudden unresponsiveness and CPR initiated by family members, EMS called and he was found with V-fib arrest, defibrillated x 3 and ROSC was achieved.  Admitted to the hospital with sinus rhythm, developed A-fib overnight.    Subjective:  Patient seen and examined.  Denies any complaints.  Came back from cardiac cath.  Wife at the bedside. Cardiac cath with normal coronaries.  Ejection fraction 35%. Remains on amiodarone and heparin. Planning for cardiac MRI and subsequent AICD before discharge.  Assessment & Plan:   Sudden cardiac death/V-fib arrest status post defibrillation and ROSC. Family history of sudden cardiac death due to cardiomyopathy. New onset A-fib with RVR.  Currently hemodynamically stable.  Remains on amiodarone infusion.  Added metoprolol.  Electrolytes are adequate.  Followed by cardiology.   Cardiac catheterization, no significant coronary artery disease Cardiac MRI, scheduled for tomorrow EF 35%, will need AICD for prevention of sudden cardiac death before discharge.  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  Started on metoprolol, Entresto and spironolactone.  Euvolemic.  Alcoholism with risk of withdrawal: Drinks about 18 beers a day every day, 24 beers on weekends.  High risk of alcohol withdrawal.  Currently stable. On Librium tapering protocol.  Uncomplicated so far. Multivitamins. B12, low normal.  1 dose of B12 injection given, will keep on 1000 mcg daily by mouth and continue on discharge. B1 and B6 pending.  Folic acid is adequate.  On thiamine replacement.  Electrolytes: Hypokalemia, replaced.  Recheck Hypophosphatemia, recheck. Hypomagnesemia, replace and monitor.  Essential  hypertension: On amlodipine at home.  Holding.   DVT prophylaxis: SCDs   Code Status: Full code Family Communication: Wife at the bedside Disposition Plan: Status is: Inpatient Remains inpatient appropriate because: Vasoactive infusions     Consultants:  Cardiology  Procedures:  Cardiac cath 12/26  Antimicrobials:  None     Objective: Vitals:   04/30/23 0904 04/30/23 0942 04/30/23 0957 04/30/23 1026  BP: 118/89 (!) 111/58 (!) 105/90 110/85  Pulse: 100  97 (!) 104  Resp: 18     Temp: 98.6 F (37 C)     TempSrc:      SpO2: 99% 100%  97%  Weight:      Height:        Intake/Output Summary (Last 24 hours) at 04/30/2023 1116 Last data filed at 04/30/2023 1021 Gross per 24 hour  Intake 2487.7 ml  Output --  Net 2487.7 ml   Filed Weights   04/28/23 1900 04/29/23 0652 04/30/23 0357  Weight: 86.3 kg 85.2 kg 85 kg    Examination:  General exam: Appears calm and comfortable.  Pleasant interaction. Respiratory system: Clear to auscultation. Respiratory effort normal. Cardiovascular system: S1 & S2 heard, RRR. No JVD, murmurs, rubs, gallops or clicks. No pedal edema. Gastrointestinal system: Abdomen is nondistended, soft and nontender. No organomegaly or masses felt. Normal bowel sounds heard. Central nervous system: Alert and oriented. No focal neurological deficits. Extremities: Symmetric 5 x 5 power. Skin: No rashes, lesions or ulcers Psychiatry: Judgement and insight appear normal. Mood & affect appropriate.     Data Reviewed: I have personally reviewed following labs and imaging studies  CBC: Recent Labs  Lab 04/28/23 1642 04/28/23 1653 04/29/23 0400 04/30/23 0402  WBC 12.3*  --  10.6* 10.2  HGB 15.2 15.6 15.6 17.2*  HCT 45.3 46.0 45.5 49.3  MCV 95.6  --  91.9 92.1  PLT 288  --  279 248   Basic Metabolic Panel: Recent Labs  Lab 04/28/23 1642 04/28/23 1653 04/28/23 1811 04/28/23 2022 04/29/23 0400 04/30/23 0402  NA 136 135  --   --  135  136  K 3.1* 3.2*  --   --  3.8 3.8  CL 102  --   --   --  103 105  CO2 18*  --   --   --  22 20*  GLUCOSE 167*  --   --   --  116* 96  BUN 7  --   --   --  6 7  CREATININE 1.12  --   --   --  0.86 0.91  CALCIUM 8.7*  --   --   --  9.0 9.1  MG  --   --  1.8  --   --  1.7  PHOS  --   --   --  2.4*  --  3.1   GFR: Estimated Creatinine Clearance: 123.2 mL/min (by C-G formula based on SCr of 0.91 mg/dL). Liver Function Tests: Recent Labs  Lab 04/29/23 0400 04/30/23 0402  AST 71* 69*  ALT 33 34  ALKPHOS 65 60  BILITOT 0.9 0.6  PROT 6.7 6.6  ALBUMIN 3.9 3.7   No results for input(s): "LIPASE", "AMYLASE" in the last 168 hours. No results for input(s): "AMMONIA" in the last 168 hours. Coagulation Profile: No results for input(s): "INR", "PROTIME" in the last 168 hours. Cardiac Enzymes: No results for input(s): "CKTOTAL", "CKMB", "CKMBINDEX", "TROPONINI" in the last 168 hours. BNP (last 3 results) No results for input(s): "PROBNP" in the last 8760 hours. HbA1C: No results for input(s): "HGBA1C" in the last 72 hours. CBG: No results for input(s): "GLUCAP" in the last 168 hours. Lipid Profile: No results for input(s): "CHOL", "HDL", "LDLCALC", "TRIG", "CHOLHDL", "LDLDIRECT" in the last 72 hours. Thyroid Function Tests: No results for input(s): "TSH", "T4TOTAL", "FREET4", "T3FREE", "THYROIDAB" in the last 72 hours. Anemia Panel: Recent Labs    04/29/23 0817  VITAMINB12 181  FOLATE 10.7   Sepsis Labs: Recent Labs  Lab 04/28/23 1705 04/28/23 1848  LATICACIDVEN 5.7* 1.4    No results found for this or any previous visit (from the past 240 hours).       Radiology Studies: CARDIAC CATHETERIZATION Result Date: 04/30/2023 Images from the original result were not included. Left Heart Catheterization 04/30/23: Hemodynamic data: LV: 97/-3, EDP 6 mmHg.  LA pressure 7 mmHg. Aorta 99/88, mean 92 mmHg.  There is no pressure gradient across the aortic valve. Angiographic data:  LV: Global hypokinesis.  Ejection fraction around 35%.  Patient in atrial fibrillation hence accurate assessment of LVEF was difficult. LM: Large-caliber vessel, smooth and normal. LAD: Smooth and normal, gives origin to small size D1 and a moderate-sized D2. LCx: Moderate caliber vessel giving origin to a moderate-sized OM1 is a continuation and small sized AV groove branch.  It is smooth and normal. RI: Small vessel, smooth and normal. Impression and recommendations: Findings consistent with nonischemic cardiomyopathy.  Optimization of medical therapy, I have started him on Entresto low-dose and 12.5 mg of spironolactone.  Rate control for atrial fibrillation with metoprolol tartrate presently on 50 mg twice daily.   EEG adult Result Date: 04/29/2023 Charlsie Quest, MD     04/29/2023  1:47 PM Patient Name: Kashdon Delmore Wilczynski  MRN: [id number] Epilepsy Attending: Charlsie Quest Referring Physician/Provider: Synetta Fail, MD Date: 04/29/2023 Duration: 22.43 mins Patient history: 37yo M s/p cardiac arrest getting eeg to evaluate for seizure Level of alertness: Awake AEDs during EEG study: Librium Technical aspects: This EEG study was done with scalp electrodes positioned according to the 10-20 International system of electrode placement. Electrical activity was reviewed with band pass filter of 1-70Hz , sensitivity of 7 uV/mm, display speed of 40mm/sec with a 60Hz  notched filter applied as appropriate. EEG data were recorded continuously and digitally stored.  Video monitoring was available and reviewed as appropriate. Description: The posterior dominant rhythm consists of 8 Hz activity of moderate voltage (25-35 uV) seen predominantly in posterior head regions, symmetric and reactive to eye opening and eye closing.  There is an excessive amount of 15 to 18 Hz beta activity distributed symmetrically and diffusely. Physiologic photic driving was seen during photic stimulation.  Hyperventilation was not  performed.   ABNORMALITY - Excessive beta, generalized IMPRESSION: This study is within normal limits. The excessive beta activity seen in the background is most likely due to the effect of benzodiazepine and is a benign EEG pattern. No seizures or epileptiform discharges were seen throughout the recording. A normal interictal EEG does not exclude the diagnosis of epilepsy. Charlsie Quest   ECHOCARDIOGRAM COMPLETE Result Date: 04/29/2023    ECHOCARDIOGRAM REPORT   Patient Name:   GERAMIAH RENNAKER Date of Exam: 04/29/2023 Medical Rec #:  [id number]         Height:       73.0 in Accession #:    [id number]        Weight:       187.8 lb Date of Birth:  1986-04-29         BSA:          2.095 m Patient Age:    37 years          BP:           142/91 mmHg Patient Gender: M                 HR:           120 bpm. Exam Location:  Inpatient Procedure: 2D Echo, 3D Echo, Cardiac Doppler and Color Doppler Indications:    Cardiac arrest  History:        Patient has no prior history of Echocardiogram examinations.                 ETOH abuse, Arrythmias:Atrial Fibrillation; Risk                 Factors:Hypertension, Dyslipidemia and Current Smoker.  Sonographer:    Dondra Prader RVT RCS Referring Phys: Synetta Fail  Sonographer Comments: HR from 120 bpm to 130bpm IMPRESSIONS  1. Left ventricular ejection fraction, by estimation, is 25 to 30%. The left ventricle has severely decreased function. The left ventricle demonstrates global hypokinesis. Indeterminate diastolic filling due to E-A fusion.  2. Right ventricular systolic function is mildly reduced. The right ventricular size is normal.  3. The mitral valve is normal in structure. Trivial mitral valve regurgitation. No evidence of mitral stenosis.  4. The aortic valve is normal in structure. Aortic valve regurgitation is not visualized. No aortic stenosis is present.  5. The inferior vena cava is dilated in size with >50% respiratory variability, suggesting right atrial  pressure of 8 mmHg. FINDINGS  Left Ventricle: Left ventricular ejection fraction, by estimation,  is 25 to 30%. The left ventricle has severely decreased function. The left ventricle demonstrates global hypokinesis. The left ventricular internal cavity size was normal in size. There is no left ventricular hypertrophy. Indeterminate diastolic filling due to E-A fusion. Right Ventricle: The right ventricular size is normal. No increase in right ventricular wall thickness. Right ventricular systolic function is mildly reduced. Left Atrium: Left atrial size was normal in size. Right Atrium: Right atrial size was normal in size. Pericardium: There is no evidence of pericardial effusion. Mitral Valve: The mitral valve is normal in structure. Trivial mitral valve regurgitation. No evidence of mitral valve stenosis. Tricuspid Valve: The tricuspid valve is normal in structure. Tricuspid valve regurgitation is trivial. No evidence of tricuspid stenosis. Aortic Valve: The aortic valve is normal in structure. Aortic valve regurgitation is not visualized. No aortic stenosis is present. Aortic valve mean gradient measures 1.8 mmHg. Aortic valve peak gradient measures 3.5 mmHg. Aortic valve area, by VTI measures 3.12 cm. Pulmonic Valve: The pulmonic valve was normal in structure. Pulmonic valve regurgitation is not visualized. No evidence of pulmonic stenosis. Aorta: The aortic root is normal in size and structure. Venous: The inferior vena cava is dilated in size with greater than 50% respiratory variability, suggesting right atrial pressure of 8 mmHg. IAS/Shunts: No atrial level shunt detected by color flow Doppler.  LEFT VENTRICLE PLAX 2D LVIDd:         5.10 cm   Diastology LVIDs:         4.30 cm   LV e' medial:    8.59 cm/s LV PW:         1.00 cm   LV E/e' medial:  6.5 LV IVS:        1.00 cm   LV e' lateral:   17.50 cm/s LVOT diam:     2.20 cm   LV E/e' lateral: 3.2 LV SV:         36 LV SV Index:   17 LVOT Area:     3.80 cm                            3D Volume EF:                          3D EF:        31 %                          LV EDV:       149 ml                          LV ESV:       103 ml                          LV SV:        47 ml RIGHT VENTRICLE RV S prime:     9.14 cm/s TAPSE (M-mode): 2.1 cm LEFT ATRIUM             Index        RIGHT ATRIUM           Index LA diam:        3.30 cm 1.58 cm/m   RA Area:     14.90 cm LA Vol (A2C):   48.0 ml  22.91 ml/m  RA Volume:   40.80 ml  19.48 ml/m LA Vol (A4C):   35.8 ml 17.09 ml/m LA Biplane Vol: 43.0 ml 20.53 ml/m  AORTIC VALVE                    PULMONIC VALVE AV Area (Vmax):    3.17 cm     PV Vmax:       0.77 m/s AV Area (Vmean):   3.14 cm     PV Peak grad:  2.4 mmHg AV Area (VTI):     3.12 cm AV Vmax:           92.94 cm/s AV Vmean:          61.300 cm/s AV VTI:            0.114 m AV Peak Grad:      3.5 mmHg AV Mean Grad:      1.8 mmHg LVOT Vmax:         77.40 cm/s LVOT Vmean:        50.633 cm/s LVOT VTI:          0.094 m LVOT/AV VTI ratio: 0.82  AORTA Ao Root diam: 3.00 cm Ao Asc diam:  3.20 cm MITRAL VALVE MV Area (PHT): 6.07 cm    SHUNTS MV Decel Time: 125 msec    Systemic VTI:  0.09 m MV E velocity: 56.10 cm/s  Systemic Diam: 2.20 cm MV A velocity: 47.70 cm/s MV E/A ratio:  1.18 Arvilla Meres MD Electronically signed by Arvilla Meres MD Signature Date/Time: 04/29/2023/10:18:46 AM    Final    DG Chest Portable 1 View Result Date: 04/28/2023 CLINICAL DATA:  Seizure-like activity followed by an episode of unresponsiveness. Atrial fibrillation. CPR. EXAM: PORTABLE CHEST 1 VIEW COMPARISON:  None Available. FINDINGS: Heart size and pulmonary vascularity are normal. Lungs are clear. No pleural effusions. No pneumothorax. Mediastinal contours appear intact. IMPRESSION: No active disease. Electronically Signed   By: Burman Nieves M.D.   On: 04/28/2023 17:16        Scheduled Meds:  chlordiazePOXIDE  25 mg Oral TID   Followed by   Melene Muller ON 05/01/2023]  chlordiazePOXIDE  25 mg Oral BH-qamhs   Followed by   Melene Muller ON 05/02/2023] chlordiazePOXIDE  25 mg Oral Daily   vitamin B-12  1,000 mcg Oral Daily   metoprolol tartrate  50 mg Oral QID   multivitamin with minerals  1 tablet Oral Daily   nicotine  21 mg Transdermal Daily   phosphorus  250 mg Oral TID   sacubitril-valsartan  1 tablet Oral BID   sodium chloride flush  3 mL Intravenous Q12H   spironolactone  12.5 mg Oral Daily   thiamine  100 mg Oral Daily   Continuous Infusions:  sodium chloride 50 mL/hr at 04/30/23 0852   amiodarone Stopped (04/30/23 0724)   magnesium sulfate bolus IVPB 4 g (04/30/23 0937)     LOS: 1 day    Time spent: 35 minutes    Dorcas Carrow, MD [facility]

## 2023-04-30 NOTE — Plan of Care (Signed)
  Problem: Education: Goal: Knowledge of General Education information will improve Description: Including pain rating scale, medication(s)/side effects and non-pharmacologic comfort measures Outcome: Progressing   Problem: Health Behavior/Discharge Planning: Goal: Ability to manage health-related needs will improve Outcome: Progressing   Problem: Clinical Measurements: Goal: Ability to maintain clinical measurements within normal limits will improve Outcome: Progressing Goal: Will remain free from infection Outcome: Progressing Goal: Diagnostic test results will improve Outcome: Progressing Goal: Respiratory complications will improve Outcome: Progressing Goal: Cardiovascular complication will be avoided Outcome: Progressing   Problem: Activity: Goal: Risk for activity intolerance will decrease Outcome: Progressing   Problem: Nutrition: Goal: Adequate nutrition will be maintained Outcome: Progressing   Problem: Coping: Goal: Level of anxiety will decrease Outcome: Progressing   Problem: Elimination: Goal: Will not experience complications related to bowel motility Outcome: Progressing Goal: Will not experience complications related to urinary retention Outcome: Progressing   Problem: Pain Management: Goal: General experience of comfort will improve Outcome: Progressing   Problem: Safety: Goal: Ability to remain free from injury will improve Outcome: Progressing   Problem: Skin Integrity: Goal: Risk for impaired skin integrity will decrease Outcome: Progressing   Problem: Education: Goal: Knowledge of disease or condition will improve Outcome: Progressing Goal: Understanding of medication regimen will improve Outcome: Progressing Goal: Individualized Educational Video(s) Outcome: Progressing   Problem: Activity: Goal: Ability to tolerate increased activity will improve Outcome: Progressing   Problem: Cardiac: Goal: Ability to achieve and maintain  adequate cardiopulmonary perfusion will improve Outcome: Progressing   Problem: Health Behavior/Discharge Planning: Goal: Ability to safely manage health-related needs after discharge will improve Outcome: Progressing   Problem: Education: Goal: Understanding of CV disease, CV risk reduction, and recovery process will improve Outcome: Progressing Goal: Individualized Educational Video(s) Outcome: Progressing   Problem: Activity: Goal: Ability to return to baseline activity level will improve Outcome: Progressing   Problem: Cardiovascular: Goal: Ability to achieve and maintain adequate cardiovascular perfusion will improve Outcome: Progressing Goal: Vascular access site(s) Level 0-1 will be maintained Outcome: Progressing   Problem: Health Behavior/Discharge Planning: Goal: Ability to safely manage health-related needs after discharge will improve Outcome: Progressing

## 2023-04-30 NOTE — Progress Notes (Signed)
Heart Failure Nurse Navigator Progress Note  PCP: Lula Olszewski, MD PCP-Cardiologist: None Admission Diagnosis: Ventricular fibrillation, Atrial fibrillation, hypokalemia Admitted from: Home via EMS  Presentation:   Warren Ross , reported was eating at a family's house then had a seizure like activity then became unresponsive. Vfib on the monitor. Shocked 3 times. CPR 4 rounds. No epi given. ROSC achieved.  Afib RVR on the monitor with HR ranging from 130-190 bpm. BP 120/95, Alcohol 41, CXR with no active disease, Left heart cath 12/26:  no significant coronary artery disease, 12/27 Cardiac MRI, scheduled . Possible AICD for prevention of sudden cardiac death before discharge.   Patient and his wife were educated on the sign and symptoms of heart failure, daily weights, when to call his doctor or go to the ED, Diet/ fluid restrictions ( reported to drinking around 8 beers per day, Mt. Dew, and water daily) education one on taking all medications as prescribed and attending all medical appointments, patient reported to smoking about 1.5 packs of cigarettes daily as well as vaping, he is trying to quit at this time. Education on his alcohol consumption and ideas about wanting to stop drinking. Patient and his wife verbalized their understanding of education , a HF TOC appointment was scheduled for 05/08/2023 @ 3 pm.   ECHO/ LVEF: 25-30%  Clinical Course:  Past Medical History:  Diagnosis Date   Carpal tunnel syndrome of right wrist 01/26/2019   Carpal tunnel syndrome, left 01/26/2019   Cellulitis 06/21/2019   Hypertension    Right carpal tunnel syndrome 01/26/2019   Viral illness 05/02/2020     Social History   Socioeconomic History   Marital status: Married    Spouse name: Not on file   Number of children: Not on file   Years of education: Not on file   Highest education level: Not on file  Occupational History   Not on file  Tobacco Use   Smoking status: Every Day    Current  packs/day: 1.50    Types: Cigarettes   Smokeless tobacco: Former  Building services engineer status: Never Used  Substance and Sexual Activity   Alcohol use: Yes    Alcohol/week: 70.0 standard drinks of alcohol    Types: 70 Cans of beer per week    Comment: "close to 24 beers a day"   Drug use: Not Currently    Types: Marijuana    Comment: every once in a while   Sexual activity: Yes    Birth control/protection: None  Other Topics Concern   Not on file  Social History Narrative   Not on file   Social Drivers of Health   Financial Resource Strain: Not on file  Food Insecurity: No Food Insecurity (04/29/2023)   Hunger Vital Sign    Worried About Running Out of Food in the Last Year: Never true    Ran Out of Food in the Last Year: Never true  Transportation Needs: No Transportation Needs (04/29/2023)   PRAPARE - Administrator, Civil Service (Medical): No    Lack of Transportation (Non-Medical): No  Physical Activity: Not on file  Stress: Not on file  Social Connections: Not on file   Education Assessment and Provision:  Detailed education and instructions provided on heart failure disease management including the following:  Signs and symptoms of Heart Failure When to call the physician Importance of daily weights Low sodium diet Fluid restriction Medication management Anticipated future follow-up appointments  Patient education given on each of the above topics.  Patient acknowledges understanding via teach back method and acceptance of all instructions.  Education Materials:  "Living Better With Heart Failure" Booklet, HF zone tool, & Daily Weight Tracker Tool.  Patient has scale at home: Yes Patient has pill box at home: No, will buy one.      High Risk Criteria for Readmission and/or Poor Patient Outcomes: Heart failure hospital admissions (last 6 months): 0  No Show rate: 10% Difficult social situation: Lives with wife, who is currently 9 months  pregnant.  Demonstrates medication adherence: yes Primary Language: English Literacy level: Reading, writing, and comprehension  Barriers of Care:   Diet/ fluid restrictions ( Beer, [place]. Dew, salt) Daily weights New HF education  Smoking/ vaping cessation  Considerations/Referrals:   Referral made to Heart Failure Pharmacist Stewardship: yes Referral made to Heart Failure CSW/NCM TOC: Yes, smoking, vaping, ETOH cessation Referral made to Heart & Vascular TOC clinic: Yes , 05/08/2023 @ 3 pm.   Items for Follow-up on DC/TOC: Continued HF education Diet/ fluid restrictions ( Beer (8 per day), Mt. Dew, salt) Daily weights Smoking, vaping, Alcohol cessation   Rhae Hammock, BSN, Scientist, clinical (histocompatibility and immunogenetics) Only

## 2023-04-30 NOTE — Interval H&P Note (Signed)
History and Physical Interval Note:  04/30/2023 8:13 AM  Warren Ross  has presented today for surgery, with the diagnosis of NSTEMI.  The various methods of treatment have been discussed with the patient and family. After consideration of risks, benefits and other options for treatment, the patient has consented to  Procedure(s): LEFT HEART CATH AND CORONARY ANGIOGRAPHY (N/A) as a surgical intervention.  The patient's history has been reviewed, patient examined, no change in status, stable for surgery.  I have reviewed the patient's chart and labs.  Questions were answered to the patient's satisfaction.     Yates Decamp

## 2023-04-30 NOTE — Progress Notes (Signed)
PHARMACY - ANTICOAGULATION CONSULT NOTE  Pharmacy Consult for heparin Indication: atrial fibrillation  No Known Allergies  Patient Measurements: Height: 6\' 1"  (185.4 cm) Weight: 85 kg (187 lb 4.8 oz) IBW/kg (Calculated) : 79.9 Heparin Dosing Weight: TBW  Vital Signs: Temp: 97.9 F (36.6 C) (12/26 2001) Temp Source: Oral (12/26 2001) BP: 95/68 (12/26 2001) Pulse Rate: 64 (12/26 2001)  Labs: Recent Labs    04/28/23 1642 04/28/23 1653 04/28/23 1846 04/29/23 0400 04/29/23 1109 04/29/23 1827 04/30/23 0402 04/30/23 2149  HGB 15.2 15.6  --  15.6  --   --  17.2*  --   HCT 45.3 46.0  --  45.5  --   --  49.3  --   PLT 288  --   --  279  --   --  248  --   HEPARINUNFRC  --   --   --  <0.10*   < > 0.31 0.36 <0.10*  CREATININE 1.12  --   --  0.86  --   --  0.91  --   TROPONINIHS 12  --  217*  --   --   --   --   --    < > = values in this interval not displayed.    Estimated Creatinine Clearance: 123.2 mL/min (by C-G formula based on SCr of 0.91 mg/dL).   Medical History: Past Medical History:  Diagnosis Date   Carpal tunnel syndrome of right wrist 01/26/2019   Carpal tunnel syndrome, left 01/26/2019   Cellulitis 06/21/2019   Hypertension    Right carpal tunnel syndrome 01/26/2019   Viral illness 05/02/2020    Assessment: 37 YOM presenting post-Vfib arrest, now in Afib with RVR, he is not on anticoagulation PTA. Per cards, cath planned for tomorrow 12/26.  Heparin level 0.36 is therapeutic on 1650 units/hr.  For cath this AM.  AM update: s/p cath, heparin to restart @1600 , 2h post TR band removal (removed @1400 ).  Can use copay cards for DOAC ([employer]) when ready to transition pending ICD plans.  PM update: heparin level < 0.1, suspicious since RN reports no issues with heparin drip running continuously, no bleeding reported. Heparin is to stop at midnight per EP orders, so will not recheck another level at this time.  Goal of Therapy:  Heparin level  0.3-0.7 units/ml Monitor platelets by anticoagulation protocol: Yes   Plan:  Continue heparin at 1650 units/hr  Monitor CBC and for s/sx of bleeding  Heparin drip to stop at midnight per EP  Loralee Pacas, PharmD, BCPS Clinical Pharmacist 04/30/2023  10:18 PM

## 2023-04-30 NOTE — Telephone Encounter (Signed)
Patient Product/process development scientist completed.    The patient is insured through [facility]. Patient has [employer], may use a copay card, and/or apply for patient assistance if available.    Ran test claim for Eliquis 5 mg and the current 30 day co-pay is $618.49 due to a deductible.  Ran test claim for Xarelto 20 mg and the current 30 day co-pay is $592.73 due to a deductible.  Ran test claim for Entresto 24-26 mg and the current 30 day co-pay is $715.65 due to a deductible.  Ran test claim for Farxiga 10 mg and the current 30 day co-pay is $605.88 due to a deductible.  Ran test claim for Jardiance 10 mg and the current 30 day co-pay is $635.82 due to a deductible.  This test claim was processed through [facility]- copay amounts may vary at other pharmacies due to pharmacy/plan contracts, or as the patient moves through the different stages of their insurance plan.     Roland Earl, CPHT Pharmacy Technician III Certified Patient Advocate [facility] Patient Advocate Team Direct Number: [phone]  Fax: [phone]

## 2023-04-30 NOTE — Progress Notes (Addendum)
PHARMACY - ANTICOAGULATION CONSULT NOTE  Pharmacy Consult for heparin Indication: atrial fibrillation  No Known Allergies  Patient Measurements: Height: 6\' 1"  (185.4 cm) Weight: 85 kg (187 lb 4.8 oz) IBW/kg (Calculated) : 79.9 Heparin Dosing Weight: TBW  Vital Signs: Temp: 97.6 F (36.4 C) (12/26 0354) Temp Source: Oral (12/26 0354) BP: 132/98 (12/26 0354) Pulse Rate: 90 (12/25 2007)  Labs: Recent Labs    04/28/23 1642 04/28/23 1653 04/28/23 1653 04/28/23 1846 04/29/23 0400 04/29/23 1109 04/29/23 1827 04/30/23 0402  HGB 15.2 15.6  --   --  15.6  --   --  17.2*  HCT 45.3 46.0  --   --  45.5  --   --  49.3  PLT 288  --   --   --  279  --   --  248  HEPARINUNFRC  --   --    < >  --  <0.10* 0.24* 0.31 0.36  CREATININE 1.12  --   --   --  0.86  --   --  0.91  TROPONINIHS 12  --   --  217*  --   --   --   --    < > = values in this interval not displayed.    Estimated Creatinine Clearance: 123.2 mL/min (by C-G formula based on SCr of 0.91 mg/dL).   Medical History: Past Medical History:  Diagnosis Date   Carpal tunnel syndrome of right wrist 01/26/2019   Carpal tunnel syndrome, left 01/26/2019   Cellulitis 06/21/2019   Hypertension    Right carpal tunnel syndrome 01/26/2019   Viral illness 05/02/2020    Assessment: 37 YOM presenting post-Vfib arrest, now in Afib with RVR, he is not on anticoagulation PTA. Per cards, cath planned for tomorrow 12/26.  Heparin level 0.36 is therapeutic on 1650 units/hr.  For cath this AM.  AM update: s/p cath, heparin to restart @1600 , 2h post TR band removal (removed @1400 ).  Can use copay cards for DOAC ([employer]) when ready to transition pending ICD plans.  Goal of Therapy:  Heparin level 0.3-0.7 units/ml Monitor platelets by anticoagulation protocol: Yes   Plan:  RESTART heparin at 1650 units/hr at 1600 F/u 6 hour heparin level Monitor CBC and for s/sx of bleeding   Trixie Rude, PharmD Clinical  Pharmacist 04/30/2023  7:51 AM

## 2023-04-30 NOTE — Progress Notes (Addendum)
Patient Name: Warren Ross Date of Encounter: 04/30/2023 [facility] Cardiologist: None   Interval Summary  .    Patient reports feeling well this AM. No chest pain, shortness of breath. Tolerated catheterization well.   Vital Signs .    Vitals:   04/30/23 0802 04/30/23 0807 04/30/23 0812 04/30/23 0817  BP: (!) 124/95 117/88 (!) 126/103 (!) 126/103  Pulse: (!) 117 (!) 131 (!) 114 (!) 0  Resp: (!) 32 (!) 24 (!) 29   Temp:      TempSrc:      SpO2: 99% 98% 99%   Weight:      Height:        Intake/Output Summary (Last 24 hours) at 04/30/2023 0858 Last data filed at 04/30/2023 0852 Gross per 24 hour  Intake 2007.7 ml  Output --  Net 2007.7 ml      04/30/2023    3:57 AM 04/29/2023    6:52 AM 04/28/2023    7:00 PM  Last 3 Weights  Weight (lbs) 187 lb 4.8 oz 187 lb 12.8 oz 190 lb 4.8 oz  Weight (kg) 84.959 kg 85.186 kg 86.32 kg      Telemetry/ECG    Atrial fibrillation, HR in the 80s-90s - Personally Reviewed  Physical Exam .   GEN: No acute distress. Sitting comfortably in the bed   Neck: No JVD Cardiac: Irregular rate and rhythm, no murmurs, rubs, or gallops. Wearing TR band on the right wrist  Respiratory: Clear to auscultation bilaterally. Normal work of breathing on room air  GI: Soft, nontender, non-distended  MS: No edema in BLE   Assessment & Plan .     VT Arrest  -Patient was found unresponsive at home by family and EMS was called.  He was defibrillated 3 times for ventricular tachycardia in the field (each time the arrhythmia was successfully defibrillated but returned quickly).  Had 4 rounds of CPR before ROSC achieved. -Patient reported that his father had sudden cardiac arrest at age 4.  Reportedly had an autopsy that showed that his heart was abnormal (probably "enlarged" per patient)  -When patient was 14, he had an echocardiogram was told he had a "athletic heart".  Question if patient has a family dilated versus hypertrophic  cardiomyopathy -Patient also has a history of alcohol abuse- could be alcoholic cardiomyopathy? - Patient started on IV amiodarone in the ED  - Underwent Echocardiogram that showed EF 25-30%, mildly reduced RV function  - Cardiac catheterization this AM showed no CAD. Consistent with nonischemic cardiomyopathy  -K3.8, mag 1.7 today. On arrival, K was 3.1, mag was 1.8 - Patient likely needs ICD and EP consult  - Plan cardiac MRI this admission   Acute Systolic Heart Failure  -As above, echocardiogram this admission showed EF 25-30% with global hypokinesis, mildly reduced RV function.  Cardiac catheterization without CAD.  Consistent with nonischemic cardiomyopathy -As above, patient's father had cardiac arrest at age 99 and had a "enlarged" heart on autopsy.  Question familial dilated cardiomyopathy versus alcoholic cardiomyopathy -Given patient's young age, may be best to get advanced heart failure involved with his care.  Asked them to review chart, may be seen as an outpatient  -Now on Entresto 24-26 mg twice daily, spironolactone 12.5 mg daily, metoprolol tartrate 50 mg twice daily - Plan for cardiac MRI this admission   Atrial Fibrillation  - Patient had atrial fibrillation with RVR after his arrest - Now on IV amiodarone, metoprolol, and IV heparin. Per telemetry,  HR well controlled  - Continue IV heparin in case patient needs additional procedures this admission.  CHADS-VASc 2. Likely will need DOAC at DC   Chronic ETA abuse  - Reports drinking up to 18 beers per day  - Discussed alcohol cessation   Tobacco use  - Discussed tobacco cessation   HTN  - BP improving with GDMT as above   For questions or updates, please contact [facility] Please consult [email or web address] for contact info under        Signed, Jonita Albee, PA-C     Patient seen and examined. Agree with assessment and plan.  Patient is back from the Cath Lab which did not show any coronary  obstructive disease.  He is day 2 status post out of hospital VT cardiac arrest and underwent 3 defibrillations.  The patient works in Sport and exercise psychologist business.  He admits to a longstanding EtOH history.  Currently, when he gets home from work, he drinks nonstop beer for total of 18 beers per night prior to going to bed.  He also has a previous 2 pack/day smoking history but most recently has only been smoking 1 pack/day but continuosly vapes in addition to the cigarettes.  He has a high likelihood for alcoholic cardiomyopathy.  Plan cardiac MRI tomorrow.  ECG earlier showed A-fib at 126 bpm.  Rhythm currently is atrial fibrillation in the 80s.  Patient will need ICD and should consult EP. currently he is in atrial fibrillation on IV amiodarone without recurrent VT.  He has been started on low-dose Entresto, spironolactone, and is on metoprolol tartrate.  Consider switching metoprolol tartrate to succinate or carvedilol.  Repleat potassium and magnesium.  Will recheck ECG and labs in a.m.   Lennette Bihari, MD, [facility] 04/30/2023 10:51 AM

## 2023-04-30 NOTE — Care Management (Signed)
Transition of Care [facility]) - Inpatient Brief Assessment   Patient Details  Name: Warren Ross MRN: [id number] Date of Birth: November 19, 1985  Transition of Care [facility]) CM/SW Contact:    Lockie Pares, RN Phone Number: 04/30/2023, 5:06 PM   Clinical Narrative: 37 Yo new heart failure EF 35% Vfib arrest at home shocked and CPR. Will get AICD, currently on Amiodarone drip.Has a follow up appt with Heart Failure clinic, Counselled on ETOH and has  been on Librium taper.  NO needs identified for discharge, The patient will be discussed in progressive daily rounds, should a need be identfied, please place a TOC consult.    Transition of Care Asessment: Insurance and Status: Insurance coverage has been reviewed Patient has primary care physician: Yes Home environment has been reviewed: p Prior level of function:: Independent Prior/Current Home Services: No current home services Social Drivers of Health Review: SDOH reviewed interventions complete Readmission risk has been reviewed: Yes Transition of care needs: no transition of care needs at this time

## 2023-05-01 ENCOUNTER — Encounter (HOSPITAL_COMMUNITY): Payer: Self-pay | Admitting: Cardiology

## 2023-05-01 ENCOUNTER — Inpatient Hospital Stay (HOSPITAL_COMMUNITY): Payer: BC Managed Care – PPO

## 2023-05-01 ENCOUNTER — Encounter: Payer: Self-pay | Admitting: Internal Medicine

## 2023-05-01 ENCOUNTER — Encounter (HOSPITAL_COMMUNITY)
Admission: EM | Disposition: A | Discharge status: Home / Self Care | Payer: Self-pay | Source: Home / Self Care | Attending: Internal Medicine

## 2023-05-01 DIAGNOSIS — I428 Other cardiomyopathies: Secondary | ICD-10-CM | POA: Insufficient documentation

## 2023-05-01 DIAGNOSIS — I340 Nonrheumatic mitral (valve) insufficiency: Secondary | ICD-10-CM

## 2023-05-01 DIAGNOSIS — I469 Cardiac arrest, cause unspecified: Secondary | ICD-10-CM | POA: Diagnosis not present

## 2023-05-01 HISTORY — PX: ICD IMPLANT: EP1208

## 2023-05-01 HISTORY — DX: Other cardiomyopathies: I42.8

## 2023-05-01 LAB — BASIC METABOLIC PANEL
Anion gap: 9 (ref 5–15)
BUN: <5 mg/dL — ABNORMAL LOW (ref 6–20)
CO2: 23 mmol/L (ref 22–32)
Calcium: 8.8 mg/dL — ABNORMAL LOW (ref 8.9–10.3)
Chloride: 105 mmol/L (ref 98–111)
Creatinine, Ser: 0.9 mg/dL (ref 0.61–1.24)
GFR, Estimated: >60 mL/min (ref >60)
Glucose, Bld: 100 mg/dL — ABNORMAL HIGH (ref 70–99)
Potassium: 3.4 mmol/L — ABNORMAL LOW (ref 3.5–5.1)
Sodium: 137 mmol/L (ref 135–145)

## 2023-05-01 LAB — CBC
HCT: 47.9 % (ref 39.0–52.0)
Hemoglobin: 16.5 g/dL (ref 13.0–17.0)
MCH: 31.8 pg (ref 26.0–34.0)
MCHC: 34.4 g/dL (ref 30.0–36.0)
MCV: 92.3 fL (ref 80.0–100.0)
Platelets: 261 10*3/uL (ref 150–400)
RBC: 5.19 MIL/uL (ref 4.22–5.81)
RDW: 12.2 % (ref 11.5–15.5)
WBC: 8.7 10*3/uL (ref 4.0–10.5)
nRBC: 0 % (ref 0.0–0.2)

## 2023-05-01 LAB — TSH: TSH: 3.343 u[IU]/mL (ref 0.350–4.500)

## 2023-05-01 LAB — SURGICAL PCR SCREEN
MRSA, PCR: NEGATIVE
Staphylococcus aureus: NEGATIVE

## 2023-05-01 LAB — MAGNESIUM: Magnesium: 1.9 mg/dL (ref 1.7–2.4)

## 2023-05-01 LAB — HEPARIN LEVEL (UNFRACTIONATED): Heparin Unfractionated: <0.1 [IU]/mL — ABNORMAL LOW (ref 0.30–0.70)

## 2023-05-01 SURGERY — ICD IMPLANT

## 2023-05-01 MED ORDER — FENTANYL CITRATE (PF) 100 MCG/2ML IJ SOLN
INTRAMUSCULAR | Status: DC | PRN
  Administered 2023-05-01 (×3): 50 ug via INTRAVENOUS

## 2023-05-01 MED ORDER — POTASSIUM CHLORIDE 20 MEQ PO PACK: 40.0000 meq | PACK | Freq: Once | ORAL | Status: DC

## 2023-05-01 MED ORDER — FENTANYL CITRATE (PF) 100 MCG/2ML IJ SOLN
INTRAMUSCULAR | Status: AC
  Filled 2023-05-01: qty 2

## 2023-05-01 MED ORDER — LIDOCAINE HCL (PF) 1 % IJ SOLN
INTRAMUSCULAR | Status: DC | PRN
  Administered 2023-05-01: 50 mL

## 2023-05-01 MED ORDER — MIDAZOLAM HCL 2 MG/2ML IJ SOLN
INTRAMUSCULAR | Status: AC
  Filled 2023-05-01: qty 2

## 2023-05-01 MED ORDER — ONDANSETRON HCL 4 MG/2ML IJ SOLN: 4.0000 mg | Freq: Four times a day (QID) | INTRAMUSCULAR | Status: DC | PRN

## 2023-05-01 MED ORDER — MAGNESIUM SULFATE 2 GM/50ML IV SOLN
2.0000 g | Freq: Once | INTRAVENOUS | Status: AC
  Administered 2023-05-01: 2 g via INTRAVENOUS
  Filled 2023-05-01: qty 50

## 2023-05-01 MED ORDER — GADOBUTROL 1 MMOL/ML IV SOLN
10.0000 mL | Freq: Once | INTRAVENOUS | Status: AC | PRN
  Administered 2023-05-01: 10 mL via INTRAVENOUS

## 2023-05-01 MED ORDER — SODIUM CHLORIDE 0.9 % IV SOLN: INTRAVENOUS | Status: DC

## 2023-05-01 MED ORDER — SODIUM CHLORIDE 0.9 % IV SOLN
80.0000 mg | INTRAVENOUS | Status: AC
  Administered 2023-05-01: 80 mg
  Filled 2023-05-01: qty 2

## 2023-05-01 MED ORDER — AMIODARONE HCL 200 MG PO TABS: 400.0000 mg | ORAL_TABLET | Freq: Every day | ORAL | Status: DC

## 2023-05-01 MED ORDER — CEFAZOLIN SODIUM-DEXTROSE 2-4 GM/100ML-% IV SOLN
INTRAVENOUS | Status: AC
  Filled 2023-05-01: qty 100

## 2023-05-01 MED ORDER — CEFAZOLIN SODIUM-DEXTROSE 2-4 GM/100ML-% IV SOLN
2.0000 g | INTRAVENOUS | Status: AC
  Administered 2023-05-01: 2 g via INTRAVENOUS
  Filled 2023-05-01: qty 100

## 2023-05-01 MED ORDER — SODIUM CHLORIDE 0.9% FLUSH
3.0000 mL | INTRAVENOUS | Status: DC | PRN
Start: 2023-05-01 — End: 2023-05-01

## 2023-05-01 MED ORDER — POTASSIUM CHLORIDE CRYS ER 20 MEQ PO TBCR
60.0000 meq | EXTENDED_RELEASE_TABLET | Freq: Once | ORAL | Status: AC
  Administered 2023-05-01: 60 meq via ORAL
  Filled 2023-05-01: qty 3

## 2023-05-01 MED ORDER — LIDOCAINE HCL (PF) 1 % IJ SOLN
INTRAMUSCULAR | Status: AC
  Filled 2023-05-01: qty 60

## 2023-05-01 MED ORDER — SODIUM CHLORIDE 0.9% FLUSH
3.0000 mL | Freq: Two times a day (BID) | INTRAVENOUS | Status: DC
  Administered 2023-05-01: 10 mL via INTRAVENOUS

## 2023-05-01 MED ORDER — METOPROLOL TARTRATE 100 MG PO TABS
100.0000 mg | ORAL_TABLET | Freq: Two times a day (BID) | ORAL | Status: DC
Start: 2023-05-01 — End: 2023-05-02
  Administered 2023-05-01 – 2023-05-02 (×3): 100 mg via ORAL
  Filled 2023-05-01 (×3): qty 1

## 2023-05-01 MED ORDER — AMIODARONE HCL 200 MG PO TABS
400.0000 mg | ORAL_TABLET | Freq: Two times a day (BID) | ORAL | Status: DC
  Administered 2023-05-01 – 2023-05-02 (×3): 400 mg via ORAL
  Filled 2023-05-01 (×3): qty 2

## 2023-05-01 MED ORDER — DAPAGLIFLOZIN PROPANEDIOL 10 MG PO TABS
10.0000 mg | ORAL_TABLET | Freq: Every day | ORAL | Status: DC
  Administered 2023-05-01 – 2023-05-02 (×2): 10 mg via ORAL
  Filled 2023-05-01 (×2): qty 1

## 2023-05-01 MED ORDER — CHLORHEXIDINE GLUCONATE 4 % EX SOLN
60.0000 mL | Freq: Once | CUTANEOUS | Status: AC
  Administered 2023-05-01: 4 via TOPICAL

## 2023-05-01 MED ORDER — IOHEXOL 350 MG/ML SOLN
INTRAVENOUS | Status: DC | PRN
  Administered 2023-05-01: 10 mL

## 2023-05-01 MED ORDER — SODIUM CHLORIDE 0.9 % IV SOLN
INTRAVENOUS | Status: AC
  Filled 2023-05-01: qty 2

## 2023-05-01 MED ORDER — HEPARIN (PORCINE) IN NACL 1000-0.9 UT/500ML-% IV SOLN
INTRAVENOUS | Status: DC | PRN
  Administered 2023-05-01: 500 mL

## 2023-05-01 MED ORDER — MIDAZOLAM HCL 5 MG/5ML IJ SOLN
INTRAMUSCULAR | Status: DC | PRN
  Administered 2023-05-01 (×3): 1 mg via INTRAVENOUS

## 2023-05-01 SURGICAL SUPPLY — 8 items
CABLE SURGICAL S-101-97-12 (CABLE) ×1 IMPLANT
ICD VIGILANT VR D232 (Pacemaker) IMPLANT
KIT MICROPUNCTURE NIT STIFF (SHEATH) IMPLANT
LEAD RELIANCE 0673 IMPLANT
PAD DEFIB RADIO PHYSIO CONN (PAD) ×1 IMPLANT
SHEATH 8FR PRELUDE SNAP 13 (SHEATH) IMPLANT
SHEATH PROBE COVER 6X72 (BAG) IMPLANT
TRAY PACEMAKER INSERTION (PACKS) ×1 IMPLANT

## 2023-05-01 NOTE — Progress Notes (Signed)
  Progress Note   Patient: Warren Ross WGN:[id number] DOB: 10-Jun-1985 DOA: 04/28/2023     2 DOS: the patient was seen and examined on 05/01/2023   Brief hospital course: 37 yo M with PMH of HTN, HLD, anxiety, alcohol use (12-18 beers per day), and tobacco use. Presented to the ED on 12/24 with vfib arrest. Family reports he had seizure-like activity prior to arrest. He was defibrillated three times and underwent 4 rounds of CPR. Was responsive after ROSC. EKG noted afib RVR in the ED. Reports family history of heart disease - father had a sudden cardiac arrest at age 69. CXR with no active disease. ECHO 12/25 with LVEF 25-30%, global hypokinesis, RV mildly reduced, trivial MR. LHC on 12/26 consistent with NICM.   Assessment and Plan: Sudden cardiac death w/ V-fib arrest s/p defibrillation and ROSC - Amiodarone 400 mg PO bid  - Planned for cardiac MRI today - ICD at cardiology's discretion (due to low EF)  Nonischemic cardiomyopathy  - Farxiga 10 mg PO daily  - Entresto 24-26 mg PO bid  - Aldactone 12.5 mg PO daily   Alcohol abuse - Librium tapering protocol in place  - B12 1000 mcg PO daily  - MVI PO daily  - Thiamine 100 mg PO daily   HTN  - Lopressor 100 mg PO bid       Subjective: Pt seen and examined at the bedside. He is planned for cardiac MRI today. He will proceed with ICD at the discretion of the cardiology service (and likely once the cardiac MRI is completed).  Physical Exam: Vitals:   04/30/23 1026 04/30/23 1213 04/30/23 2001 05/01/23 0458  BP: 110/85 107/88 95/68 100/66  Pulse: (!) 104 86 64 (!) 46  Resp:  18 18 18   Temp:  97.9 F (36.6 C) 97.9 F (36.6 C) 98.1 F (36.7 C)  TempSrc:  Oral Oral Oral  SpO2: 97% 99% 100% 97%  Weight:    84.9 kg  Height:       Physical Exam Constitutional:      Appearance: Normal appearance.  HENT:     Head: Normocephalic.     Mouth/Throat:     Mouth: Mucous membranes are moist.  Cardiovascular:     Rate and  Rhythm: Normal rate and regular rhythm.  Pulmonary:     Effort: Pulmonary effort is normal.  Abdominal:     Palpations: Abdomen is soft.  Musculoskeletal:        General: Normal range of motion.     Cervical back: Neck supple.  Skin:    General: Skin is warm.  Neurological:     Mental Status: He is alert. Mental status is at baseline.  Psychiatric:        Mood and Affect: Mood normal.       Disposition: Status is: Inpatient Remains inpatient appropriate because: Further cardiac workup  Planned Discharge Destination: Home    Time spent: 35 minutes  Author: Baron Hamper , MD 05/01/2023 11:35 AM  For on call review [email or web address].

## 2023-05-01 NOTE — Consult Note (Addendum)
ELECTROPHYSIOLOGY CONSULT NOTE    Patient ID: Warren Ross MRN: [id number], DOB/AGE: [birth year] 39 y.o.  Admit date: 04/28/2023 Date of Consult: 05/01/2023  Primary Physician: Warren Olszewski, MD Primary Cardiologist: None  Electrophysiologist: New   Referring Provider: Dr. Tresa Ross  Patient Profile: Warren Ross is a 37 y.o. male with a history of HTN, ETOH abuse, HLD, anxiety, carpal tunnel syndrome, tobacco use, and family history of SCD who is being seen today for the evaluation of aborted VF arrest at the request of Dr. Tresa Ross.  HPI:  Warren Ross is a 37 y.o. male with history as above.   On 12/24 pt was found unresponsive at home by family with questionable seizure like activity prior to that. EMS was called and pt required defibrillation x 3 for VT. By report each time the rhythm was defibrillated, but returned. He had 4 roudns of CPR, no epi was given. ROSC was achieved and became alert and answered questions. He did complain of chest pressure and blurry vision. Noted to be in AF RVR at arrival in ED.   Pertinent labs on admission include K 3.1, bicarb 18, glucose 167, Cr 1.12, anion gap 16. Hs trop 12 x1. Lactic acid 5.7. Ion Ca 0.98. WBC 12300. VBG PH 7.4, pO2 57, TCO2 19. Alcohol 41. CXR showed no acute finding.   Echo 04/29/2023 showed LVEF 25-30%, mild RV dysfunction, trivial MR  [facility] 04/30/2023 showed  LV: 97/-3, EDP 6 mmHg.  LA pressure 7 mmHg. Aorta 99/88, mean 92 mmHg.  There is no pressure gradient across the aortic valve. No significant CAD  EP asked to see for ICD consideration in setting of aborted cardiac arrest.  cMRI pending.   Pt feeling well this am. Reports ETOH use of 12-18 beers daily PTA. Smokes 1.5 ppd. His father passed away suddenly in his 74s and he remembers being told his heart was "abnormal" and maybe even "enlarged".  The patient also remembers previously being told he had an "athletic heart" during a screening for competitive  soccer when he was 14.    He denies history of syncope and reports being in his USOH up until the event. 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 Denies h/o chest pain with activity, though sore s/p CPR.   Labs Potassium3.4* (12/27 0441) Magnesium  1.9 (12/27 0441) Creatinine, ser  0.90 (12/27 0441) PLT  261 (12/27 0441) HGB  16.5 (12/27 0441) WBC 8.7 (12/27 0441) Troponin I (High Sensitivity)217* (12/24 1846).    Past Medical History:  Diagnosis Date   Carpal tunnel syndrome of right wrist 01/26/2019   Carpal tunnel syndrome, left 01/26/2019   Cellulitis 06/21/2019   Hypertension    Right carpal tunnel syndrome 01/26/2019   Viral illness 05/02/2020     Surgical History:  Past Surgical History:  Procedure Laterality Date   HAND SURGERY Left    LEFT HEART CATH AND CORONARY ANGIOGRAPHY N/A 04/30/2023   Procedure: LEFT HEART CATH AND CORONARY ANGIOGRAPHY;  Surgeon: Warren Decamp, MD;  Location: [facility] INVASIVE [facility];  Service: Cardiovascular;  Laterality: N/A;   MANDIBLE SURGERY Left 2017     Medications Prior to Admission  Medication Sig Dispense Refill Last Dose/Taking   amLODipine (NORVASC) 10 MG tablet TAKE 1 TABLET BY MOUTH EVERY DAY 90 tablet 3 04/27/2023    Inpatient Medications:   chlordiazePOXIDE  25 mg Oral BH-qamhs   Followed by   Melene Muller ON 05/02/2023] chlordiazePOXIDE  25 mg  Oral Daily   vitamin B-12  1,000 mcg Oral Daily   metoprolol tartrate  50 mg Oral QID   multivitamin with minerals  1 tablet Oral Daily   nicotine  21 mg Transdermal Daily   phosphorus  250 mg Oral TID   potassium chloride  60 mEq Oral Once   sacubitril-valsartan  1 tablet Oral BID   sodium chloride flush  3 mL Intravenous Q12H   spironolactone  12.5 mg Oral Daily   thiamine  100 mg Oral Daily    Allergies: No Known Allergies  Family History  Problem Relation Age of Onset   COPD Mother    Early death Father    Diabetes Maternal Grandfather       Physical Exam: Vitals:   04/30/23 1026 04/30/23 1213 04/30/23 2001 05/01/23 0458  BP: 110/85 107/88 95/68 100/66  Pulse: (!) 104 86 64 (!) 46  Resp:  18 18 18   Temp:  97.9 F (36.6 C) 97.9 F (36.6 C) 98.1 F (36.7 C)  TempSrc:  Oral Oral Oral  SpO2: 97% 99% 100% 97%  Weight:    84.9 kg  Height:        GEN- NAD, A&O x 3, normal affect HEENT: Normocephalic, atraumatic Lungs- CTAB, Normal effort.  Heart- Regular rate and rhythm, No M/G/R.  GI- Soft, NT, ND.  Extremities- No clubbing, cyanosis, or edema   Radiology/Studies: DG Eye Foreign Body Result Date: 04/30/2023 CLINICAL DATA:  Metal working/exposure; clearance prior to MRI EXAM: ORBITS FOR FOREIGN BODY - 2 VIEW COMPARISON:  None Available. FINDINGS: There is no evidence of metallic foreign body within the orbits. No significant bone abnormality identified. IMPRESSION: No evidence of metallic foreign body within the orbits. Electronically Signed   By: Alcide Clever M.D.   On: 04/30/2023 16:45   CARDIAC CATHETERIZATION Result Date: 04/30/2023 Images from the original result were not included. Left Heart Catheterization 04/30/23: Hemodynamic data: LV: 97/-3, EDP 6 mmHg.  LA pressure 7 mmHg. Aorta 99/88, mean 92 mmHg.  There is no pressure gradient across the aortic valve. Angiographic data: LV: Global hypokinesis.  Ejection fraction around 35%.  Patient in atrial fibrillation hence accurate assessment of LVEF was difficult. LM: Large-caliber vessel, smooth and normal. LAD: Smooth and normal, gives origin to small size D1 and a moderate-sized D2. LCx: Moderate caliber vessel giving origin to a moderate-sized OM1 is a continuation and small sized AV groove branch.  It is smooth and normal. RI: Small vessel, smooth and normal. Impression and recommendations: Findings consistent with nonischemic cardiomyopathy.  Optimization of medical therapy, I have started him on Entresto low-dose and 12.5 mg of spironolactone.  Rate control for atrial  fibrillation with metoprolol tartrate presently on 50 mg twice daily.   EEG adult Result Date: 04/29/2023 Warren Quest, MD     04/29/2023  1:47 PM Patient Name: Warren Ross MRN: [id number] Epilepsy Attending: Charlsie Ross Referring Physician/Provider: Synetta Fail, MD Date: 04/29/2023 Duration: 22.43 mins Patient history: 37yo M s/p cardiac arrest getting eeg to evaluate for seizure Level of alertness: Awake AEDs during EEG study: Librium Technical aspects: This EEG study was done with scalp electrodes positioned according to the 10-20 International system of electrode placement. Electrical activity was reviewed with band pass filter of 1-70Hz , sensitivity of 7 uV/mm, display speed of 42mm/sec with a 60Hz  notched filter applied as appropriate. EEG data were recorded continuously and digitally stored.  Video monitoring was available and reviewed as appropriate. Description: The posterior dominant  rhythm consists of 8 Hz activity of moderate voltage (25-35 uV) seen predominantly in posterior head regions, symmetric and reactive to eye opening and eye closing.  There is an excessive amount of 15 to 18 Hz beta activity distributed symmetrically and diffusely. Physiologic photic driving was seen during photic stimulation.  Hyperventilation was not performed.   ABNORMALITY - Excessive beta, generalized IMPRESSION: This study is within normal limits. The excessive beta activity seen in the background is most likely due to the effect of benzodiazepine and is a benign EEG pattern. No seizures or epileptiform discharges were seen throughout the recording. A normal interictal EEG does not exclude the diagnosis of epilepsy. Warren Ross   ECHOCARDIOGRAM COMPLETE Result Date: 04/29/2023    ECHOCARDIOGRAM REPORT   Patient Name:   NICKOLAS DEBSKI Date of Exam: 04/29/2023 Medical Rec #:  [id number]         Height:       73.0 in Accession #:    [id number]        Weight:       187.8 lb Date of Birth:   Aug 06, 1985         BSA:          2.095 m Patient Age:    37 years          BP:           142/91 mmHg Patient Gender: M                 HR:           120 bpm. Exam Location:  Inpatient Procedure: 2D Echo, 3D Echo, Cardiac Doppler and Color Doppler Indications:    Cardiac arrest  History:        Patient has no prior history of Echocardiogram examinations.                 ETOH abuse, Arrythmias:Atrial Fibrillation; Risk                 Factors:Hypertension, Dyslipidemia and Current Smoker.  Sonographer:    Dondra Prader RVT RCS Referring Phys: Synetta Fail  Sonographer Comments: HR from 120 bpm to 130bpm IMPRESSIONS  1. Left ventricular ejection fraction, by estimation, is 25 to 30%. The left ventricle has severely decreased function. The left ventricle demonstrates global hypokinesis. Indeterminate diastolic filling due to E-A fusion.  2. Right ventricular systolic function is mildly reduced. The right ventricular size is normal.  3. The mitral valve is normal in structure. Trivial mitral valve regurgitation. No evidence of mitral stenosis.  4. The aortic valve is normal in structure. Aortic valve regurgitation is not visualized. No aortic stenosis is present.  5. The inferior vena cava is dilated in size with >50% respiratory variability, suggesting right atrial pressure of 8 mmHg. FINDINGS  Left Ventricle: Left ventricular ejection fraction, by estimation, is 25 to 30%. The left ventricle has severely decreased function. The left ventricle demonstrates global hypokinesis. The left ventricular internal cavity size was normal in size. There is no left ventricular hypertrophy. Indeterminate diastolic filling due to E-A fusion. Right Ventricle: The right ventricular size is normal. No increase in right ventricular wall thickness. Right ventricular systolic function is mildly reduced. Left Atrium: Left atrial size was normal in size. Right Atrium: Right atrial size was normal in size. Pericardium: There is no  evidence of pericardial effusion. Mitral Valve: The mitral valve is normal in structure. Trivial mitral valve regurgitation. No evidence of mitral valve stenosis.  Tricuspid Valve: The tricuspid valve is normal in structure. Tricuspid valve regurgitation is trivial. No evidence of tricuspid stenosis. Aortic Valve: The aortic valve is normal in structure. Aortic valve regurgitation is not visualized. No aortic stenosis is present. Aortic valve mean gradient measures 1.8 mmHg. Aortic valve peak gradient measures 3.5 mmHg. Aortic valve area, by VTI measures 3.12 cm. Pulmonic Valve: The pulmonic valve was normal in structure. Pulmonic valve regurgitation is not visualized. No evidence of pulmonic stenosis. Aorta: The aortic root is normal in size and structure. Venous: The inferior vena cava is dilated in size with greater than 50% respiratory variability, suggesting right atrial pressure of 8 mmHg. IAS/Shunts: No atrial level shunt detected by color flow Doppler.  LEFT VENTRICLE PLAX 2D LVIDd:         5.10 cm   Diastology LVIDs:         4.30 cm   LV e' medial:    8.59 cm/s LV PW:         1.00 cm   LV E/e' medial:  6.5 LV IVS:        1.00 cm   LV e' lateral:   17.50 cm/s LVOT diam:     2.20 cm   LV E/e' lateral: 3.2 LV SV:         36 LV SV Index:   17 LVOT Area:     3.80 cm                           3D Volume EF:                          3D EF:        31 %                          LV EDV:       149 ml                          LV ESV:       103 ml                          LV SV:        47 ml RIGHT VENTRICLE RV S prime:     9.14 cm/s TAPSE (M-mode): 2.1 cm LEFT ATRIUM             Index        RIGHT ATRIUM           Index LA diam:        3.30 cm 1.58 cm/m   RA Area:     14.90 cm LA Vol (A2C):   48.0 ml 22.91 ml/m  RA Volume:   40.80 ml  19.48 ml/m LA Vol (A4C):   35.8 ml 17.09 ml/m LA Biplane Vol: 43.0 ml 20.53 ml/m  AORTIC VALVE                    PULMONIC VALVE AV Area (Vmax):    3.17 cm     PV Vmax:       0.77  m/s AV Area (Vmean):   3.14 cm     PV Peak grad:  2.4 mmHg AV Area (VTI):     3.12 cm AV Vmax:  92.94 cm/s AV Vmean:          61.300 cm/s AV VTI:            0.114 m AV Peak Grad:      3.5 mmHg AV Mean Grad:      1.8 mmHg LVOT Vmax:         77.40 cm/s LVOT Vmean:        50.633 cm/s LVOT VTI:          0.094 m LVOT/AV VTI ratio: 0.82  AORTA Ao Root diam: 3.00 cm Ao Asc diam:  3.20 cm MITRAL VALVE MV Area (PHT): 6.07 cm    SHUNTS MV Decel Time: 125 msec    Systemic VTI:  0.09 m MV E velocity: 56.10 cm/s  Systemic Diam: 2.20 cm MV A velocity: 47.70 cm/s MV E/A ratio:  1.18 Arvilla Meres MD Electronically signed by Arvilla Meres MD Signature Date/Time: 04/29/2023/10:18:46 AM    Final    DG Chest Portable 1 View Result Date: 04/28/2023 CLINICAL DATA:  Seizure-like activity followed by an episode of unresponsiveness. Atrial fibrillation. CPR. EXAM: PORTABLE CHEST 1 VIEW COMPARISON:  None Available. FINDINGS: Heart size and pulmonary vascularity are normal. Lungs are clear. No pleural effusions. No pneumothorax. Mediastinal contours appear intact. IMPRESSION: No active disease. Electronically Signed   By: Burman Nieves M.D.   On: 04/28/2023 17:16    EKG: on arrival shows AF with RVR at 133 bpm (personally reviewed)  TELEMETRY: AF 80-110s (personally reviewed)    Assessment/Plan:  Aborted Cardiac Arrest VF Arrest K 3.1 on arrival, s/p arrest. Mg 1.8. Has been on amiodarone for AF as well Cath without CAD as below EF 25-30%, cMRI pending Potassium3.4* (12/27 0441) Magnesium  1.9 (12/27 0441) Creatinine, ser  0.90 (12/27 0441) Keep K > 4.0 and Mg > 2.0  Will ultimately need ICD for aborted cardiac arrest. Discussed transvenous, S-ICD, and Lifevest pending cMRI results. With AF, will likely most benefit from transvenous device for discriminators for fast AF.  Explained risks, benefits, and alternatives to ICD implantation, including but not limited to bleeding, infection,  pneumothorax, pericardial effusion, lead dislodgement, heart attack, stroke, or death.  Pt verbalized understanding and agrees to proceed.  Timing pending further work up.    Reviewed NCDMV driving restrictions x 6 months.   Acute systolic CHF   EF 25-30% with global HK and mild RV dysfunction CAD without CAD Family history of SCD DDx includes familial vs alcoholic cardiomyopathy  Atrial fibrillation  New diagnosis, denies history of the same CHA2DS2VASc of at least 2 Heparin on hold for possible procedures  Chronic ETOH abuse CIWA Protocol with Librium and hydroxyzine.  Drank 12-18 beers daily at home.   Tobacco abuse Smoked 1.5 ppd PTA   For questions or updates, please contact [facility] Please consult [email or web address] for contact info under Cardiology/STEMI.  Dustin Flock, PA-C  05/01/2023 8:57 AM

## 2023-05-01 NOTE — Progress Notes (Addendum)
Patient Name: Warren Ross Date of Encounter: 05/01/2023 [facility] Cardiologist: None   Interval Summary  .    Patient resting comfortably in bed. Reports no anginal symptoms at rest or with ambulation. He was seen by EP this morning. He is aware of the plans for MRI and likely ICD placement.   Vital Signs .    Vitals:   04/30/23 1026 04/30/23 1213 04/30/23 2001 05/01/23 0458  BP: 110/85 107/88 95/68 100/66  Pulse: (!) 104 86 64 (!) 46  Resp:  18 18 18   Temp:  97.9 F (36.6 C) 97.9 F (36.6 C) 98.1 F (36.7 C)  TempSrc:  Oral Oral Oral  SpO2: 97% 99% 100% 97%  Weight:    84.9 kg  Height:        Intake/Output Summary (Last 24 hours) at 05/01/2023 0928 Last data filed at 05/01/2023 5621 Gross per 24 hour  Intake 1514.61 ml  Output --  Net 1514.61 ml      05/01/2023    4:58 AM 04/30/2023    3:57 AM 04/29/2023    6:52 AM  Last 3 Weights  Weight (lbs) 187 lb 1.6 oz 187 lb 4.8 oz 187 lb 12.8 oz  Weight (kg) 84.868 kg 84.959 kg 85.186 kg      Telemetry/ECG    Atrial fibrillation, HR 92 - Personally Reviewed  Physical Exam .   GEN: No acute distress, resting comfortably in bed Neck: No JVD Cardiac: irregular rhythm, normal rate, no murmurs, rubs, or gallops.  Respiratory: Clear to auscultation bilaterally. GI: Soft, nontender, non-distended  MS: No edema Skin: insertion site on right wrist healing well, no bleeding  Assessment & Plan .    VT Arrest Patient arrived to ED on 04/28/2023 after being found unresponsive at home. He was defibrillated 3 times for VT and had 4 rounds of CPR before ROSC was achieved.  Patient reports his father had a sudden cardiac arrest at age 56, autopsy revealed an abnormal heart but patient is unsure exactly what Patient also reports history of alcohol abuse, stating that his father also drank  Was stared on amiodarone IV in the ED  Echo showed EF 25-30% with mildly reduced RV function  Heart cath showed no CAD,  LVEF of 35%, consistent with nonischemic cardiomyopathy  -- Patient to get cardiac MRI today  -- Likely to need ICD placement by EP post cardiac MRI, was seen by EP this morning  Acute Systolic Heart Failure Echo showed LVEF 25-30% with global hypokinesis, mildly reduced RV function  Heart cath showed no CAD, EF of 35%, consistent with nonischemic cardiomyopathy  Due to patient personal, social and family history question familial dilated cardiomyopathy vs. alcoholic cardiomyopathy  -- Patient will be seen by advanced heart failure outpatient, due to his young age  -- Continue Entresto 24-26 mg BID, spironolactone 12.5 mg daily -- Start Farxiga 10 mg daily  -- Adjust metoprolol dose to 100 mg BID  -- Patient to get cardiac MRI today  Atrial Fibrillation Patient developed Afib with RVR after returning to sinus rhythm post VT arrest HR well controlled, 80-90s this morning  -- Continue metoprolol (switch to 100 mg BID), IV heparin  -- Discontinue IV amiodarone and switch to oral loading dose  -- Likely add DOAC at discharge  Hypertension  BP readings have been improving -- Continue with GDMT as listed above  Electrolyte Imbalances  Hypokalemia -- 3.1 on arrival, up to 3.8, 3.4 most recent  Hypophosphatemia --  2.4 on arrival, 3.1 most recent  Hypomagnesemia -- 1.8 on arrival, down to 1.7, 1.9 most recent  -- Continue to replace as needed  -- Continue to monitor labs   Chronic ETA Abuse Patient reports drinking up to 18 beers/day -- Alcohol cessation has been discussed with patient  -- Patient has not had to use any PRN medications  Tobacco Use -- Tobacco cessation has been discussed with patient   For questions or updates, please contact [facility] Please consult [email or web address] for contact info under    Signed, Olena Leatherwood, PA-C    Patient seen and examined. Agree with assessment and plan.  Museum increased to 1.9 today; calcium was 3.8 yesterday decreased  to 3.4 today.  Give extra 40 mill equivalents KCl.  TSH normal. Patient getting cardiac MRI with plans for ICD implant this afternoon with Dr. Ladona Ridgel.   Lennette Bihari, MD, [facility] 05/01/2023 11:04 AM

## 2023-05-01 NOTE — Interval H&P Note (Signed)
History and Physical Interval Note:  05/01/2023 4:45 PM  Warren Ross  has presented today for surgery, with the diagnosis of cardiac arrest.  The various methods of treatment have been discussed with the patient and family. After consideration of risks, benefits and other options for treatment, the patient has consented to  Procedure(s): ICD IMPLANT (N/A) as a surgical intervention.  The patient's history has been reviewed, patient examined, no change in status, stable for surgery.  I have reviewed the patient's chart and labs.  Questions were answered to the patient's satisfaction.     Nobie Putnam

## 2023-05-01 NOTE — Progress Notes (Signed)
   Heart Failure Stewardship Pharmacist Progress Note   PCP: Lula Olszewski, MD PCP-Cardiologist: None    HPI:  37 yo M with PMH of HTN, HLD, anxiety, alcohol use (12-18 beers per day), and tobacco use.   Presented to the ED on 12/24 with vfib arrest. Family reports he had seizure-like activity prior to arrest. He was defibrillated three times and underwent 4 rounds of CPR. Was responsive after ROSC. EKG noted afib RVR in the ED. Reports family history of heart disease - father had a sudden cardiac arrest at age 7. CXR with no active disease. ECHO 12/25 with LVEF 25-30%, global hypokinesis, RV mildly reduced, trivial MR. LHC on 12/26 consistent with NICM.   Pending cMRI before discharge.  Current HF Medications: Beta Blocker: metoprolol tartrate 50 mg 4 times daily ACE/ARB/ARNI: Entresto 24/26 mg BID MRA: spironolactone 12.5 mg daily  Prior to admission HF Medications: None  Pertinent Lab Values: Serum creatinine 0.90, BUN <5, Potassium 3.4, Sodium 137, Magnesium 1.9  Vital Signs: Weight: 187 lbs (admission weight: 190 lbs) Blood pressure: 100/60s  Heart rate: 80-90s  I/O: incomplete  Medication Assistance / Insurance Benefits Check: Does the patient have prescription insurance?  Yes Type of insurance plan: Financial risk analyst  Outpatient Pharmacy:  Prior to admission outpatient pharmacy: [facility] Is the patient willing to use [facility] pharmacy at discharge? Yes Is the patient willing to transition their outpatient pharmacy to utilize a [facility] outpatient pharmacy?   Pending    Assessment: 1. 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 NYHA class II symptoms. - Not volume overloaded on exam. Keep K>4 and Mg>2. KCl 60 mEq x 1 and magnesium 2g IV x 1 ordered for replacement.  - Continue metoprolol tartrate 50 mg 4 times daily - will need to consolidate to XL prior to discharge - Continue Entresto 24/26 mg BID  - Continue spironolactone 12.5 mg daily - Consider  adding Farxiga 10 mg daily  Plan: 1) Medication changes recommended at this time: - Add Farxiga 10 mg daily - Transition metoprolol to XL  2) Patient assistance: - Has a deductible remaining on his insurance. Initial copays >$500. Entresto copay card can be used toward the deductible, lowering the copay to $10.  3)  Education  - To be completed prior to discharge  Sharen Hones, PharmD, BCPS Heart Failure Stewardship Pharmacist Phone [phone]

## 2023-05-01 NOTE — H&P (View-Only) (Signed)
ELECTROPHYSIOLOGY CONSULT NOTE    Patient ID: Warren Ross MRN: [id number], DOB/AGE: [birth year] 39 y.o.  Admit date: 04/28/2023 Date of Consult: 05/01/2023  Primary Physician: Lula Olszewski, MD Primary Cardiologist: None  Electrophysiologist: New   Referring Provider: Dr. Tresa Endo  Patient Profile: Warren Ross is a 37 y.o. male with a history of HTN, ETOH abuse, HLD, anxiety, carpal tunnel syndrome, tobacco use, and family history of SCD who is being seen today for the evaluation of aborted VF arrest at the request of Dr. Tresa Endo.  HPI:  Warren Ross is a 37 y.o. male with history as above.   On 12/24 pt was found unresponsive at home by family with questionable seizure like activity prior to that. EMS was called and pt required defibrillation x 3 for VT. By report each time the rhythm was defibrillated, but returned. He had 4 roudns of CPR, no epi was given. ROSC was achieved and became alert and answered questions. He did complain of chest pressure and blurry vision. Noted to be in AF RVR at arrival in ED.   Pertinent labs on admission include K 3.1, bicarb 18, glucose 167, Cr 1.12, anion gap 16. Hs trop 12 x1. Lactic acid 5.7. Ion Ca 0.98. WBC 12300. VBG PH 7.4, pO2 57, TCO2 19. Alcohol 41. CXR showed no acute finding.   Echo 04/29/2023 showed LVEF 25-30%, mild RV dysfunction, trivial MR  [facility] 04/30/2023 showed  LV: 97/-3, EDP 6 mmHg.  LA pressure 7 mmHg. Aorta 99/88, mean 92 mmHg.  There is no pressure gradient across the aortic valve. No significant CAD  EP asked to see for ICD consideration in setting of aborted cardiac arrest.  cMRI pending.   Pt feeling well this am. Reports ETOH use of 12-18 beers daily PTA. Smokes 1.5 ppd. His father passed away suddenly in his 74s and he remembers being told his heart was "abnormal" and maybe even "enlarged".  The patient also remembers previously being told he had an "athletic heart" during a screening for competitive  soccer when he was 14.    He denies history of syncope and reports being in his USOH up until the event. 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 Denies h/o chest pain with activity, though sore s/p CPR.   Labs Potassium3.4* (12/27 0441) Magnesium  1.9 (12/27 0441) Creatinine, ser  0.90 (12/27 0441) PLT  261 (12/27 0441) HGB  16.5 (12/27 0441) WBC 8.7 (12/27 0441) Troponin I (High Sensitivity)217* (12/24 1846).    Past Medical History:  Diagnosis Date   Carpal tunnel syndrome of right wrist 01/26/2019   Carpal tunnel syndrome, left 01/26/2019   Cellulitis 06/21/2019   Hypertension    Right carpal tunnel syndrome 01/26/2019   Viral illness 05/02/2020     Surgical History:  Past Surgical History:  Procedure Laterality Date   HAND SURGERY Left    LEFT HEART CATH AND CORONARY ANGIOGRAPHY N/A 04/30/2023   Procedure: LEFT HEART CATH AND CORONARY ANGIOGRAPHY;  Surgeon: Yates Decamp, MD;  Location: [facility];  Service: Cardiovascular;  Laterality: N/A;   MANDIBLE SURGERY Left 2017     Medications Prior to Admission  Medication Sig Dispense Refill Last Dose/Taking   amLODipine (NORVASC) 10 MG tablet TAKE 1 TABLET BY MOUTH EVERY DAY 90 tablet 3 04/27/2023    Inpatient Medications:   chlordiazePOXIDE  25 mg Oral BH-qamhs   Followed by   Melene Muller ON 05/02/2023] chlordiazePOXIDE  25 mg  Oral Daily   vitamin B-12  1,000 mcg Oral Daily   metoprolol tartrate  50 mg Oral QID   multivitamin with minerals  1 tablet Oral Daily   nicotine  21 mg Transdermal Daily   phosphorus  250 mg Oral TID   potassium chloride  60 mEq Oral Once   sacubitril-valsartan  1 tablet Oral BID   sodium chloride flush  3 mL Intravenous Q12H   spironolactone  12.5 mg Oral Daily   thiamine  100 mg Oral Daily    Allergies: No Known Allergies  Family History  Problem Relation Age of Onset   COPD Mother    Early death Father    Diabetes Maternal Grandfather       Physical Exam: Vitals:   04/30/23 1026 04/30/23 1213 04/30/23 2001 05/01/23 0458  BP: 110/85 107/88 95/68 100/66  Pulse: (!) 104 86 64 (!) 46  Resp:  18 18 18   Temp:  97.9 F (36.6 C) 97.9 F (36.6 C) 98.1 F (36.7 C)  TempSrc:  Oral Oral Oral  SpO2: 97% 99% 100% 97%  Weight:    84.9 kg  Height:        GEN- NAD, A&O x 3, normal affect HEENT: Normocephalic, atraumatic Lungs- CTAB, Normal effort.  Heart- Regular rate and rhythm, No M/G/R.  GI- Soft, NT, ND.  Extremities- No clubbing, cyanosis, or edema   Radiology/Studies: DG Eye Foreign Body Result Date: 04/30/2023 CLINICAL DATA:  Metal working/exposure; clearance prior to MRI EXAM: ORBITS FOR FOREIGN BODY - 2 VIEW COMPARISON:  None Available. FINDINGS: There is no evidence of metallic foreign body within the orbits. No significant bone abnormality identified. IMPRESSION: No evidence of metallic foreign body within the orbits. Electronically Signed   By: Alcide Clever M.D.   On: 04/30/2023 16:45   CARDIAC CATHETERIZATION Result Date: 04/30/2023 Images from the original result were not included. Left Heart Catheterization 04/30/23: Hemodynamic data: LV: 97/-3, EDP 6 mmHg.  LA pressure 7 mmHg. Aorta 99/88, mean 92 mmHg.  There is no pressure gradient across the aortic valve. Angiographic data: LV: Global hypokinesis.  Ejection fraction around 35%.  Patient in atrial fibrillation hence accurate assessment of LVEF was difficult. LM: Large-caliber vessel, smooth and normal. LAD: Smooth and normal, gives origin to small size D1 and a moderate-sized D2. LCx: Moderate caliber vessel giving origin to a moderate-sized OM1 is a continuation and small sized AV groove branch.  It is smooth and normal. RI: Small vessel, smooth and normal. Impression and recommendations: Findings consistent with nonischemic cardiomyopathy.  Optimization of medical therapy, I have started him on Entresto low-dose and 12.5 mg of spironolactone.  Rate control for atrial  fibrillation with metoprolol tartrate presently on 50 mg twice daily.   EEG adult Result Date: 04/29/2023 Charlsie Quest, MD     04/29/2023  1:47 PM Patient Name: Warren Ross MRN: [id number] Epilepsy Attending: Charlsie Quest Referring Physician/Provider: Synetta Fail, MD Date: 04/29/2023 Duration: 22.43 mins Patient history: 37yo M s/p cardiac arrest getting eeg to evaluate for seizure Level of alertness: Awake AEDs during EEG study: Librium Technical aspects: This EEG study was done with scalp electrodes positioned according to the 10-20 International system of electrode placement. Electrical activity was reviewed with band pass filter of 1-70Hz , sensitivity of 7 uV/mm, display speed of 42mm/sec with a 60Hz  notched filter applied as appropriate. EEG data were recorded continuously and digitally stored.  Video monitoring was available and reviewed as appropriate. Description: The posterior dominant  rhythm consists of 8 Hz activity of moderate voltage (25-35 uV) seen predominantly in posterior head regions, symmetric and reactive to eye opening and eye closing.  There is an excessive amount of 15 to 18 Hz beta activity distributed symmetrically and diffusely. Physiologic photic driving was seen during photic stimulation.  Hyperventilation was not performed.   ABNORMALITY - Excessive beta, generalized IMPRESSION: This study is within normal limits. The excessive beta activity seen in the background is most likely due to the effect of benzodiazepine and is a benign EEG pattern. No seizures or epileptiform discharges were seen throughout the recording. A normal interictal EEG does not exclude the diagnosis of epilepsy. Charlsie Quest   ECHOCARDIOGRAM COMPLETE Result Date: 04/29/2023    ECHOCARDIOGRAM REPORT   Patient Name:   Warren Ross Date of Exam: 04/29/2023 Medical Rec #:  [id number]         Height:       73.0 in Accession #:    [id number]        Weight:       187.8 lb Date of Birth:   Aug 06, 1985         BSA:          2.095 m Patient Age:    37 years          BP:           142/91 mmHg Patient Gender: M                 HR:           120 bpm. Exam Location:  Inpatient Procedure: 2D Echo, 3D Echo, Cardiac Doppler and Color Doppler Indications:    Cardiac arrest  History:        Patient has no prior history of Echocardiogram examinations.                 ETOH abuse, Arrythmias:Atrial Fibrillation; Risk                 Factors:Hypertension, Dyslipidemia and Current Smoker.  Sonographer:    Dondra Prader RVT RCS Referring Phys: Synetta Fail  Sonographer Comments: HR from 120 bpm to 130bpm IMPRESSIONS  1. Left ventricular ejection fraction, by estimation, is 25 to 30%. The left ventricle has severely decreased function. The left ventricle demonstrates global hypokinesis. Indeterminate diastolic filling due to E-A fusion.  2. Right ventricular systolic function is mildly reduced. The right ventricular size is normal.  3. The mitral valve is normal in structure. Trivial mitral valve regurgitation. No evidence of mitral stenosis.  4. The aortic valve is normal in structure. Aortic valve regurgitation is not visualized. No aortic stenosis is present.  5. The inferior vena cava is dilated in size with >50% respiratory variability, suggesting right atrial pressure of 8 mmHg. FINDINGS  Left Ventricle: Left ventricular ejection fraction, by estimation, is 25 to 30%. The left ventricle has severely decreased function. The left ventricle demonstrates global hypokinesis. The left ventricular internal cavity size was normal in size. There is no left ventricular hypertrophy. Indeterminate diastolic filling due to E-A fusion. Right Ventricle: The right ventricular size is normal. No increase in right ventricular wall thickness. Right ventricular systolic function is mildly reduced. Left Atrium: Left atrial size was normal in size. Right Atrium: Right atrial size was normal in size. Pericardium: There is no  evidence of pericardial effusion. Mitral Valve: The mitral valve is normal in structure. Trivial mitral valve regurgitation. No evidence of mitral valve stenosis.  Tricuspid Valve: The tricuspid valve is normal in structure. Tricuspid valve regurgitation is trivial. No evidence of tricuspid stenosis. Aortic Valve: The aortic valve is normal in structure. Aortic valve regurgitation is not visualized. No aortic stenosis is present. Aortic valve mean gradient measures 1.8 mmHg. Aortic valve peak gradient measures 3.5 mmHg. Aortic valve area, by VTI measures 3.12 cm. Pulmonic Valve: The pulmonic valve was normal in structure. Pulmonic valve regurgitation is not visualized. No evidence of pulmonic stenosis. Aorta: The aortic root is normal in size and structure. Venous: The inferior vena cava is dilated in size with greater than 50% respiratory variability, suggesting right atrial pressure of 8 mmHg. IAS/Shunts: No atrial level shunt detected by color flow Doppler.  LEFT VENTRICLE PLAX 2D LVIDd:         5.10 cm   Diastology LVIDs:         4.30 cm   LV e' medial:    8.59 cm/s LV PW:         1.00 cm   LV E/e' medial:  6.5 LV IVS:        1.00 cm   LV e' lateral:   17.50 cm/s LVOT diam:     2.20 cm   LV E/e' lateral: 3.2 LV SV:         36 LV SV Index:   17 LVOT Area:     3.80 cm                           3D Volume EF:                          3D EF:        31 %                          LV EDV:       149 ml                          LV ESV:       103 ml                          LV SV:        47 ml RIGHT VENTRICLE RV S prime:     9.14 cm/s TAPSE (M-mode): 2.1 cm LEFT ATRIUM             Index        RIGHT ATRIUM           Index LA diam:        3.30 cm 1.58 cm/m   RA Area:     14.90 cm LA Vol (A2C):   48.0 ml 22.91 ml/m  RA Volume:   40.80 ml  19.48 ml/m LA Vol (A4C):   35.8 ml 17.09 ml/m LA Biplane Vol: 43.0 ml 20.53 ml/m  AORTIC VALVE                    PULMONIC VALVE AV Area (Vmax):    3.17 cm     PV Vmax:       0.77  m/s AV Area (Vmean):   3.14 cm     PV Peak grad:  2.4 mmHg AV Area (VTI):     3.12 cm AV Vmax:  92.94 cm/s AV Vmean:          61.300 cm/s AV VTI:            0.114 m AV Peak Grad:      3.5 mmHg AV Mean Grad:      1.8 mmHg LVOT Vmax:         77.40 cm/s LVOT Vmean:        50.633 cm/s LVOT VTI:          0.094 m LVOT/AV VTI ratio: 0.82  AORTA Ao Root diam: 3.00 cm Ao Asc diam:  3.20 cm MITRAL VALVE MV Area (PHT): 6.07 cm    SHUNTS MV Decel Time: 125 msec    Systemic VTI:  0.09 m MV E velocity: 56.10 cm/s  Systemic Diam: 2.20 cm MV A velocity: 47.70 cm/s MV E/A ratio:  1.18 Arvilla Meres MD Electronically signed by Arvilla Meres MD Signature Date/Time: 04/29/2023/10:18:46 AM    Final    DG Chest Portable 1 View Result Date: 04/28/2023 CLINICAL DATA:  Seizure-like activity followed by an episode of unresponsiveness. Atrial fibrillation. CPR. EXAM: PORTABLE CHEST 1 VIEW COMPARISON:  None Available. FINDINGS: Heart size and pulmonary vascularity are normal. Lungs are clear. No pleural effusions. No pneumothorax. Mediastinal contours appear intact. IMPRESSION: No active disease. Electronically Signed   By: Burman Nieves M.D.   On: 04/28/2023 17:16    EKG: on arrival shows AF with RVR at 133 bpm (personally reviewed)  TELEMETRY: AF 80-110s (personally reviewed)    Assessment/Plan:  Aborted Cardiac Arrest VF Arrest K 3.1 on arrival, s/p arrest. Mg 1.8. Has been on amiodarone for AF as well Cath without CAD as below EF 25-30%, cMRI pending Potassium3.4* (12/27 0441) Magnesium  1.9 (12/27 0441) Creatinine, ser  0.90 (12/27 0441) Keep K > 4.0 and Mg > 2.0  Will ultimately need ICD for aborted cardiac arrest. Discussed transvenous, S-ICD, and Lifevest pending cMRI results. With AF, will likely most benefit from transvenous device for discriminators for fast AF.  Explained risks, benefits, and alternatives to ICD implantation, including but not limited to bleeding, infection,  pneumothorax, pericardial effusion, lead dislodgement, heart attack, stroke, or death.  Pt verbalized understanding and agrees to proceed.  Timing pending further work up.    Reviewed [place] driving restrictions x 6 months.   Acute systolic CHF   EF 25-30% with global HK and mild RV dysfunction CAD without CAD Family history of SCD DDx includes familial vs alcoholic cardiomyopathy  Atrial fibrillation  New diagnosis, denies history of the same CHA2DS2VASc of at least 2 Heparin on hold for possible procedures  Chronic ETOH abuse CIWA Protocol with Librium and hydroxyzine.  Drank 12-18 beers daily at home.   Tobacco abuse Smoked 1.5 ppd PTA   For questions or updates, please contact [facility] Please consult [email or web address] for contact info under Cardiology/STEMI.  Dustin Flock, PA-C  05/01/2023 8:57 AM

## 2023-05-01 NOTE — Plan of Care (Signed)
  Problem: Education: Goal: Knowledge of General Education information will improve Description: Including pain rating scale, medication(s)/side effects and non-pharmacologic comfort measures Outcome: Progressing   Problem: Health Behavior/Discharge Planning: Goal: Ability to manage health-related needs will improve Outcome: Progressing   Problem: Clinical Measurements: Goal: Ability to maintain clinical measurements within normal limits will improve Outcome: Progressing Goal: Will remain free from infection Outcome: Progressing Goal: Diagnostic test results will improve Outcome: Progressing Goal: Respiratory complications will improve Outcome: Progressing Goal: Cardiovascular complication will be avoided Outcome: Progressing   Problem: Activity: Goal: Risk for activity intolerance will decrease Outcome: Progressing   Problem: Nutrition: Goal: Adequate nutrition will be maintained Outcome: Progressing   Problem: Coping: Goal: Level of anxiety will decrease Outcome: Progressing   Problem: Elimination: Goal: Will not experience complications related to bowel motility Outcome: Progressing Goal: Will not experience complications related to urinary retention Outcome: Progressing   Problem: Pain Management: Goal: General experience of comfort will improve Outcome: Progressing   Problem: Safety: Goal: Ability to remain free from injury will improve Outcome: Progressing   Problem: Skin Integrity: Goal: Risk for impaired skin integrity will decrease Outcome: Progressing   Problem: Education: Goal: Knowledge of disease or condition will improve Outcome: Progressing Goal: Understanding of medication regimen will improve Outcome: Progressing Goal: Individualized Educational Video(s) Outcome: Progressing   Problem: Activity: Goal: Ability to tolerate increased activity will improve Outcome: Progressing   Problem: Cardiac: Goal: Ability to achieve and maintain  adequate cardiopulmonary perfusion will improve Outcome: Progressing   Problem: Health Behavior/Discharge Planning: Goal: Ability to safely manage health-related needs after discharge will improve Outcome: Progressing   Problem: Education: Goal: Understanding of CV disease, CV risk reduction, and recovery process will improve Outcome: Progressing Goal: Individualized Educational Video(s) Outcome: Progressing   Problem: Activity: Goal: Ability to return to baseline activity level will improve Outcome: Progressing   Problem: Cardiovascular: Goal: Ability to achieve and maintain adequate cardiovascular perfusion will improve Outcome: Progressing Goal: Vascular access site(s) Level 0-1 will be maintained Outcome: Progressing   Problem: Health Behavior/Discharge Planning: Goal: Ability to safely manage health-related needs after discharge will improve Outcome: Progressing

## 2023-05-02 ENCOUNTER — Inpatient Hospital Stay (HOSPITAL_COMMUNITY): Payer: BC Managed Care – PPO

## 2023-05-02 DIAGNOSIS — I5021 Acute systolic (congestive) heart failure: Secondary | ICD-10-CM | POA: Diagnosis not present

## 2023-05-02 DIAGNOSIS — I110 Hypertensive heart disease with heart failure: Secondary | ICD-10-CM | POA: Diagnosis not present

## 2023-05-02 DIAGNOSIS — I469 Cardiac arrest, cause unspecified: Secondary | ICD-10-CM | POA: Diagnosis not present

## 2023-05-02 DIAGNOSIS — I4901 Ventricular fibrillation: Secondary | ICD-10-CM | POA: Diagnosis not present

## 2023-05-02 DIAGNOSIS — I462 Cardiac arrest due to underlying cardiac condition: Secondary | ICD-10-CM | POA: Diagnosis not present

## 2023-05-02 LAB — BASIC METABOLIC PANEL
Anion gap: 10 (ref 5–15)
BUN: 7 mg/dL (ref 6–20)
CO2: 21 mmol/L — ABNORMAL LOW (ref 22–32)
Calcium: 8.9 mg/dL (ref 8.9–10.3)
Chloride: 105 mmol/L (ref 98–111)
Creatinine, Ser: 0.99 mg/dL (ref 0.61–1.24)
GFR, Estimated: >60 mL/min (ref >60)
Glucose, Bld: 93 mg/dL (ref 70–99)
Potassium: 3.9 mmol/L (ref 3.5–5.1)
Sodium: 136 mmol/L (ref 135–145)

## 2023-05-02 LAB — CBC
HCT: 49.9 % (ref 39.0–52.0)
Hemoglobin: 17.1 g/dL — ABNORMAL HIGH (ref 13.0–17.0)
MCH: 31.7 pg (ref 26.0–34.0)
MCHC: 34.3 g/dL (ref 30.0–36.0)
MCV: 92.6 fL (ref 80.0–100.0)
Platelets: 235 10*3/uL (ref 150–400)
RBC: 5.39 MIL/uL (ref 4.22–5.81)
RDW: 12.2 % (ref 11.5–15.5)
WBC: 9.3 10*3/uL (ref 4.0–10.5)
nRBC: 0 % (ref 0.0–0.2)

## 2023-05-02 LAB — LIPOPROTEIN A (LPA): Lipoprotein (a): 197 nmol/L — ABNORMAL HIGH (ref <75.0)

## 2023-05-02 LAB — MAGNESIUM: Magnesium: 2 mg/dL (ref 1.7–2.4)

## 2023-05-02 MED ORDER — SACUBITRIL-VALSARTAN 24-26 MG PO TABS: 1.0000 | ORAL_TABLET | Freq: Two times a day (BID) | ORAL | 0 refills | Status: DC

## 2023-05-02 MED ORDER — APIXABAN 5 MG PO TABS: 5.0000 mg | ORAL_TABLET | Freq: Two times a day (BID) | ORAL | 0 refills | Status: DC

## 2023-05-02 MED ORDER — NICOTINE 21 MG/24HR TD PT24: 21.0000 mg | MEDICATED_PATCH | Freq: Every day | TRANSDERMAL | 0 refills | Status: DC

## 2023-05-02 MED ORDER — DAPAGLIFLOZIN PROPANEDIOL 10 MG PO TABS: 10.0000 mg | ORAL_TABLET | Freq: Every day | ORAL | 0 refills | Status: DC

## 2023-05-02 MED ORDER — SPIRONOLACTONE 25 MG PO TABS: 12.5000 mg | ORAL_TABLET | Freq: Every day | ORAL | 0 refills | Status: DC

## 2023-05-02 MED ORDER — ORAL CARE MOUTH RINSE: 15.0000 mL | OROMUCOSAL | Status: DC | PRN

## 2023-05-02 MED ORDER — AMIODARONE HCL 400 MG PO TABS: ORAL_TABLET | ORAL | 0 refills | Status: DC

## 2023-05-02 MED ORDER — METOPROLOL SUCCINATE ER 100 MG PO TB24: 100.0000 mg | ORAL_TABLET | Freq: Every day | ORAL | 0 refills | Status: DC

## 2023-05-02 NOTE — Progress Notes (Signed)
   05/01/23 2031  Assess: MEWS Score  Temp 97.9 F (36.6 C)  BP 98/74  MAP (mmHg) 83  Pulse Rate 88  ECG Heart Rate (!) 147  Resp 16  Level of Consciousness Alert  SpO2 100 %  O2 Device Room Air  Assess: MEWS Score  MEWS Temp 0  MEWS Systolic 1  MEWS Pulse 3  MEWS RR 0  MEWS LOC 0  MEWS Score 4  MEWS Score Color Red  Assess: if the MEWS score is Yellow or Red  Were vital signs accurate and taken at a resting state? Yes  Does the patient meet 2 or more of the SIRS criteria? Yes  Does the patient have a confirmed or suspected source of infection? No  MEWS guidelines implemented  Yes, red  Treat  MEWS Interventions Considered administering scheduled or prn medications/treatments as ordered  Take Vital Signs  Increase Vital Sign Frequency  Red: Q1hr x2, continue Q4hrs until patient remains green for 12hrs  Escalate  MEWS: Escalate Red: Discuss with charge nurse and notify provider. Consider notifying RRT. If remains red for 2 hours consider need for higher level of care  Notify: Charge Nurse/RN  Name of Charge Nurse/RN Notified Christina, RN  Assess: SIRS CRITERIA  SIRS Temperature  0  SIRS Respirations  0  SIRS Pulse 1  SIRS WBC 0  SIRS Score Sum  1

## 2023-05-02 NOTE — Discharge Summary (Signed)
Physician Discharge Summary   Patient: Warren Ross MRN: [id number] DOB: [birth year]  Admit date:     04/28/2023  Discharge date: 05/02/23  Discharge Physician: Tyrone Nine   PCP: Lula Olszewski, MD   Recommendations at discharge:  Follow up with cardiology 1/3 and with EP 1/9 as scheduled.   Discharge Diagnoses: Principal Problem:   Cardiac arrest [facility]) Active Problems:   Essential hypertension   Hyperlipidemia   Alcohol use   Atrial fibrillation with RVR [facility])  Hospital Course: 37 yo M with PMH of HTN, HLD, anxiety, alcohol use (12-18 beers per day), and tobacco use who presented to the ED on 12/24 with vfib arrest. Family reports he had seizure-like activity prior to arrest. He was defibrillated three times and underwent 4 rounds of CPR. Was responsive after ROSC. EKG noted afib RVR in the ED. Reports family history of heart disease - father had a sudden cardiac arrest at age 37. CXR with no active disease. ECHO 12/25 with LVEF 25-30%, global hypokinesis, RV mildly reduced, trivial MR. LHC on 12/26 consistent with NICM. After MRI, EP was consulted and placed AICD on 12/27. He is hemodynamically stable and has been cleared by the cardiology, EP and medical teams for discharge with plans to start eliquis 12/31.   Assessment and Plan: Sudden cardiac death w/ V-fib arrest s/p defibrillation and ROSC - Amiodarone 400 mg PO bid  - Planned for cardiac MRI today - ICD implanted 12/27. CXR with stable lead position. Device interrogation with stable lead parameters, cleared for DC by EP today, has follow up and plans to start [facility] in 72 hours from implant.    Nonischemic cardiomyopathy  - Will continue metoprolol, entresto, spironolactone and SGL2i.   New onset atrial fibrillation:  - Continue metoprolol, amiodarone, and start eliquis 5mg  po BID 05/05/2023.   Alcohol abuse: No evidence of withdrawal on day of discharge, has completed librium taper. Continue cessation support after  discharge.    HTN  - Meds as above.  Tobacco use:  - Nicotine patch prescribed, cessation counseling provided.   Consultants: Cardiology, EP Procedures performed:   04/30/23 LEFT HEART CATH AND CORONARY ANGIOGRAPHY Yates Decamp, MD    05/01/23 ICD IMPLANT Nobie Putnam, MD  Disposition: Home Diet recommendation:  Cardiac diet DISCHARGE MEDICATION: Allergies as of 05/02/2023   No Known Allergies      Medication List     STOP taking these medications    amLODipine 10 MG tablet Commonly known as: NORVASC       TAKE these medications    amiodarone 400 MG tablet Commonly known as: PACERONE Take 1 tablet (400 mg total) by mouth 2 (two) times daily for 6 days, THEN 1 tablet (400 mg total) daily for 24 days. Start taking on: May 02, 2023   apixaban 5 MG Tabs tablet Commonly known as: ELIQUIS Take 1 tablet (5 mg total) by mouth 2 (two) times daily. Start taking on: May 05, 2023   dapagliflozin propanediol 10 MG Tabs tablet Commonly known as: FARXIGA Take 1 tablet (10 mg total) by mouth daily. Start taking on: May 03, 2023   metoprolol succinate 100 MG 24 hr tablet Commonly known as: TOPROL-XL Take 1 tablet (100 mg total) by mouth daily.   nicotine 21 mg/24hr patch Commonly known as: NICODERM CQ - dosed in mg/24 hours Place 1 patch (21 mg total) onto the skin daily. Start taking on: May 03, 2023   sacubitril-valsartan 24-26 MG Commonly known as: ENTRESTO Take  1 tablet by mouth 2 (two) times daily.   spironolactone 25 MG tablet Commonly known as: ALDACTONE Take 0.5 tablets (12.5 mg total) by mouth daily. Start taking on: May 03, 2023        Follow-up Information     [facility]. Go in 7 day(s).   Specialty: Cardiology Why: Hospital follow up 05/08/2023 @ 3 pm PLEASE bring a current medication list to appointment FREE valet parking, Entrance C, off [employer] account executive  information: [address] [place] [address] [phone]        Device Clinic Follow up.   Why: You will need a wound check in 10 days for your device. Somebody from our office will call you to schedule this.        Lula Olszewski, MD Follow up.   Specialty: Internal Medicine Contact information: [address] [place] [address] [phone]                Discharge Exam: Ceasar Mons Weights   04/30/23 0357 05/01/23 0458 05/02/23 0503  Weight: 85 kg 84.9 kg 84.7 kg  No distress, spouse at bedside, in good spirits, eager to go home.  Clear, nonlabored Irreg irreg, no MRG Left upper chest with c/d/I steristrips overlaying pocket which is appropriately tender.  Condition at discharge: stable  The results of significant diagnostics from this hospitalization (including imaging, microbiology, ancillary and laboratory) are listed below for reference.   Imaging Studies: DG Chest 2 View Result Date: 05/02/2023 CLINICAL DATA:  Pacemaker placement. EXAM: CHEST - 2 VIEW COMPARISON:  May 01, 2023. FINDINGS: The heart size and mediastinal contours are within normal limits. Single lead left-sided defibrillator is in grossly good position. Both lungs are clear. The visualized skeletal structures are unremarkable. IMPRESSION: No active cardiopulmonary disease. Electronically Signed   By: Lupita Raider M.D.   On: 05/02/2023 08:35   EP PPM/ICD IMPLANT Addendum Date: 05/01/2023  CONCLUSIONS:  1. Successful single chamber ICD implantation for secondary prevention of sudden death.  2. No early apparent complications.  3. Resume oral anticoagulation in 72 hours. Nobie Putnam, MD Cardiac Electrophysiology  Result Date: 05/01/2023  CONCLUSIONS:  1. Successful single chamber ICD implantation for secondary prevention of sudden death.  2. No early apparent complications. Nobie Putnam, MD Cardiac Electrophysiology   DG Chest Port 1 View Result Date:  05/01/2023 CLINICAL DATA:  Status post defibrillator placement EXAM: PORTABLE CHEST 1 VIEW COMPARISON:  04/28/2023 FINDINGS: Cardiac shadow is stable. Defibrillator is now seen. No pneumothorax is noted. The lungs are clear. No bony abnormality is noted. IMPRESSION: No pneumothorax following defibrillator placement Electronically Signed   By: Alcide Clever M.D.   On: 05/01/2023 19:09   MR CARDIAC MORPHOLOGY W WO CONTRAST Result Date: 05/01/2023 CLINICAL DATA:  Clinical question of cardiomyopathy Study assumes BSA of 2.09 m2. EXAM: CARDIAC MRI TECHNIQUE: The patient was scanned on a 1.5 Tesla GE magnet. A dedicated cardiac coil was used. Functional imaging was done using Fiesta sequences. 2,3, and 4 chamber views were done to assess for RWMA's. Modified Simpson's rule using a short axis stack was used to calculate an ejection fraction on a dedicated work [employer]. The patient received 10 cc of Gadavist. After 10 minutes inversion recovery sequences were used to assess for infiltration and scar tissue. Flow quantification was performed 2 times during this examination with flow quantification performed at the levels of the ascending aorta above the valve,  pulmonary artery above the valve. CONTRAST:  10 cc  of Gadavist FINDINGS: 1. Normal left ventricular size, with LVEDD 55 mm, and LVEDVi 91 mL/m2. Normal left ventricular thickness. Severely decreased left ventricular systolic function (LVEF =28%). Relative septal hypokinesis. Left ventricular parametric mapping notable for normal T2. Suboptimal native T1 acquisition precludes accurate ECV analysis. Normal Post contrast T1. There is no late gadolinium enhancement in the left ventricular myocardium. 2. Normal right ventricular size with RVEDVI 79 mL/m2. Normal right ventricular thickness. Moderately decreased right ventricular systolic function (RVEF =37%). Relative septal hypokinesis. 3.  Normal left.  Mild right atrial enlargement. 4. Normal  size of the aortic root, ascending aorta and pulmonary artery. 5. Valve assessment: Aortic Valve: Tri-leaflet aortic valve, no significant AI. Pulmonic Valve: Mild PI, regurgitant fraction 8%. Tricuspid Valve: No significant regurgitation. Mitral Valve: Mild regurgitation, regurgitant fraction 8%. 6.  Normal pericardium.  No pericardial effusion. 7. Grossly, no extracardiac findings. Recommended dedicated study if concerned for non-cardiac pathology. 8. Breath hold artifacts noted. Study done free-breathing. This limits accuracy of flow quantification. IMPRESSION: 1.  Severe decrease in LVEF, 28%. 2.  Moderate decrease in RVEF, 37%. 3.  No LGE. Riley Lam MD Electronically Signed   By: Riley Lam M.D.   On: 05/01/2023 14:38   MR CARDIAC VELOCITY FLOW MAP Result Date: 05/01/2023 CLINICAL DATA:  Clinical question of cardiomyopathy Study assumes BSA of 2.09 m2. EXAM: CARDIAC MRI TECHNIQUE: The patient was scanned on a 1.5 Tesla GE magnet. A dedicated cardiac coil was used. Functional imaging was done using Fiesta sequences. 2,3, and 4 chamber views were done to assess for RWMA's. Modified Simpson's rule using a short axis stack was used to calculate an ejection fraction on a dedicated work [employer] officer, [employer]. The patient received 10 cc of Gadavist. After 10 minutes inversion recovery sequences were used to assess for infiltration and scar tissue. Flow quantification was performed 2 times during this examination with flow quantification performed at the levels of the ascending aorta above the valve, pulmonary artery above the valve. CONTRAST:  10 cc  of Gadavist FINDINGS: 1. Normal left ventricular size, with LVEDD 55 mm, and LVEDVi 91 mL/m2. Normal left ventricular thickness. Severely decreased left ventricular systolic function (LVEF =28%). Relative septal hypokinesis. Left ventricular parametric mapping notable for normal T2. Suboptimal native T1 acquisition precludes accurate ECV  analysis. Normal Post contrast T1. There is no late gadolinium enhancement in the left ventricular myocardium. 2. Normal right ventricular size with RVEDVI 79 mL/m2. Normal right ventricular thickness. Moderately decreased right ventricular systolic function (RVEF =37%). Relative septal hypokinesis. 3.  Normal left.  Mild right atrial enlargement. 4. Normal size of the aortic root, ascending aorta and pulmonary artery. 5. Valve assessment: Aortic Valve: Tri-leaflet aortic valve, no significant AI. Pulmonic Valve: Mild PI, regurgitant fraction 8%. Tricuspid Valve: No significant regurgitation. Mitral Valve: Mild regurgitation, regurgitant fraction 8%. 6.  Normal pericardium.  No pericardial effusion. 7. Grossly, no extracardiac findings. Recommended dedicated study if concerned for non-cardiac pathology. 8. Breath hold artifacts noted. Study done free-breathing. This limits accuracy of flow quantification. IMPRESSION: 1.  Severe decrease in LVEF, 28%. 2.  Moderate decrease in RVEF, 37%. 3.  No LGE. Riley Lam MD Electronically Signed   By: Riley Lam M.D.   On: 05/01/2023 14:38   MR CARDIAC VELOCITY FLOW MAP Result Date: 05/01/2023 CLINICAL DATA:  Clinical question of cardiomyopathy Study assumes BSA of 2.09 m2. EXAM: CARDIAC MRI TECHNIQUE: The patient was scanned  on a 1.5 Tesla GE magnet. A dedicated cardiac coil was used. Functional imaging was done using Fiesta sequences. 2,3, and 4 chamber views were done to assess for RWMA's. Modified Simpson's rule using a short axis stack was used to calculate an ejection fraction on a dedicated work [employer] officer, [employer]. The patient received 10 cc of Gadavist. After 10 minutes inversion recovery sequences were used to assess for infiltration and scar tissue. Flow quantification was performed 2 times during this examination with flow quantification performed at the levels of the ascending aorta above the valve, pulmonary artery above the  valve. CONTRAST:  10 cc  of Gadavist FINDINGS: 1. Normal left ventricular size, with LVEDD 55 mm, and LVEDVi 91 mL/m2. Normal left ventricular thickness. Severely decreased left ventricular systolic function (LVEF =28%). Relative septal hypokinesis. Left ventricular parametric mapping notable for normal T2. Suboptimal native T1 acquisition precludes accurate ECV analysis. Normal Post contrast T1. There is no late gadolinium enhancement in the left ventricular myocardium. 2. Normal right ventricular size with RVEDVI 79 mL/m2. Normal right ventricular thickness. Moderately decreased right ventricular systolic function (RVEF =37%). Relative septal hypokinesis. 3.  Normal left.  Mild right atrial enlargement. 4. Normal size of the aortic root, ascending aorta and pulmonary artery. 5. Valve assessment: Aortic Valve: Tri-leaflet aortic valve, no significant AI. Pulmonic Valve: Mild PI, regurgitant fraction 8%. Tricuspid Valve: No significant regurgitation. Mitral Valve: Mild regurgitation, regurgitant fraction 8%. 6.  Normal pericardium.  No pericardial effusion. 7. Grossly, no extracardiac findings. Recommended dedicated study if concerned for non-cardiac pathology. 8. Breath hold artifacts noted. Study done free-breathing. This limits accuracy of flow quantification. IMPRESSION: 1.  Severe decrease in LVEF, 28%. 2.  Moderate decrease in RVEF, 37%. 3.  No LGE. Riley Lam MD Electronically Signed   By: Riley Lam M.D.   On: 05/01/2023 14:38   DG Eye Foreign Body Result Date: 04/30/2023 CLINICAL DATA:  Metal working/exposure; clearance prior to MRI EXAM: ORBITS FOR FOREIGN BODY - 2 VIEW COMPARISON:  None Available. FINDINGS: There is no evidence of metallic foreign body within the orbits. No significant bone abnormality identified. IMPRESSION: No evidence of metallic foreign body within the orbits. Electronically Signed   By: Alcide Clever M.D.   On: 04/30/2023 16:45   CARDIAC  CATHETERIZATION Result Date: 04/30/2023 Images from the original result were not included. Left Heart Catheterization 04/30/23: Hemodynamic data: LV: 97/-3, EDP 6 mmHg.  LA pressure 7 mmHg. Aorta 99/88, mean 92 mmHg.  There is no pressure gradient across the aortic valve. Angiographic data: LV: Global hypokinesis.  Ejection fraction around 35%.  Patient in atrial fibrillation hence accurate assessment of LVEF was difficult. LM: Large-caliber vessel, smooth and normal. LAD: Smooth and normal, gives origin to small size D1 and a moderate-sized D2. LCx: Moderate caliber vessel giving origin to a moderate-sized OM1 is a continuation and small sized AV groove branch.  It is smooth and normal. RI: Small vessel, smooth and normal. Impression and recommendations: Findings consistent with nonischemic cardiomyopathy.  Optimization of medical therapy, I have started him on Entresto low-dose and 12.5 mg of spironolactone.  Rate control for atrial fibrillation with metoprolol tartrate presently on 50 mg twice daily.   EEG adult Result Date: 04/29/2023 Charlsie Quest, MD     04/29/2023  1:47 PM Patient Name: Warren Ross MRN: [id number] Epilepsy Attending: Charlsie Quest Referring Physician/Provider: Synetta Fail, MD Date: 04/29/2023 Duration: 22.43 mins Patient history: 37yo M s/p cardiac arrest getting eeg  to evaluate for seizure Level of alertness: Awake AEDs during EEG study: Librium Technical aspects: This EEG study was done with scalp electrodes positioned according to the 10-20 International system of electrode placement. Electrical activity was reviewed with band pass filter of 1-70Hz , sensitivity of 7 uV/mm, display speed of 49mm/sec with a 60Hz  notched filter applied as appropriate. EEG data were recorded continuously and digitally stored.  Video monitoring was available and reviewed as appropriate. Description: The posterior dominant rhythm consists of 8 Hz activity of moderate voltage (25-35 uV)  seen predominantly in posterior head regions, symmetric and reactive to eye opening and eye closing.  There is an excessive amount of 15 to 18 Hz beta activity distributed symmetrically and diffusely. Physiologic photic driving was seen during photic stimulation.  Hyperventilation was not performed.   ABNORMALITY - Excessive beta, generalized IMPRESSION: This study is within normal limits. The excessive beta activity seen in the background is most likely due to the effect of benzodiazepine and is a benign EEG pattern. No seizures or epileptiform discharges were seen throughout the recording. A normal interictal EEG does not exclude the diagnosis of epilepsy. Charlsie Quest   ECHOCARDIOGRAM COMPLETE Result Date: 04/29/2023    ECHOCARDIOGRAM REPORT   Patient Name:   Warren Ross Date of Exam: 04/29/2023 Medical Rec #:  [id number]         Height:       73.0 in Accession #:    [id number]        Weight:       187.8 lb Date of Birth:  1985/10/15         BSA:          2.095 m Patient Age:    37 years          BP:           142/91 mmHg Patient Gender: M                 HR:           120 bpm. Exam Location:  Inpatient Procedure: 2D Echo, 3D Echo, Cardiac Doppler and Color Doppler Indications:    Cardiac arrest  History:        Patient has no prior history of Echocardiogram examinations.                 ETOH abuse, Arrythmias:Atrial Fibrillation; Risk                 Factors:Hypertension, Dyslipidemia and Current Smoker.  Sonographer:    Dondra Prader RVT RCS Referring Phys: Synetta Fail  Sonographer Comments: HR from 120 bpm to 130bpm IMPRESSIONS  1. Left ventricular ejection fraction, by estimation, is 25 to 30%. The left ventricle has severely decreased function. The left ventricle demonstrates global hypokinesis. Indeterminate diastolic filling due to E-A fusion.  2. Right ventricular systolic function is mildly reduced. The right ventricular size is normal.  3. The mitral valve is normal in structure.  Trivial mitral valve regurgitation. No evidence of mitral stenosis.  4. The aortic valve is normal in structure. Aortic valve regurgitation is not visualized. No aortic stenosis is present.  5. The inferior vena cava is dilated in size with >50% respiratory variability, suggesting right atrial pressure of 8 mmHg. FINDINGS  Left Ventricle: Left ventricular ejection fraction, by estimation, is 25 to 30%. The left ventricle has severely decreased function. The left ventricle demonstrates global hypokinesis. The left ventricular internal cavity size was normal in size.  There is no left ventricular hypertrophy. Indeterminate diastolic filling due to E-A fusion. Right Ventricle: The right ventricular size is normal. No increase in right ventricular wall thickness. Right ventricular systolic function is mildly reduced. Left Atrium: Left atrial size was normal in size. Right Atrium: Right atrial size was normal in size. Pericardium: There is no evidence of pericardial effusion. Mitral Valve: The mitral valve is normal in structure. Trivial mitral valve regurgitation. No evidence of mitral valve stenosis. Tricuspid Valve: The tricuspid valve is normal in structure. Tricuspid valve regurgitation is trivial. No evidence of tricuspid stenosis. Aortic Valve: The aortic valve is normal in structure. Aortic valve regurgitation is not visualized. No aortic stenosis is present. Aortic valve mean gradient measures 1.8 mmHg. Aortic valve peak gradient measures 3.5 mmHg. Aortic valve area, by VTI measures 3.12 cm. Pulmonic Valve: The pulmonic valve was normal in structure. Pulmonic valve regurgitation is not visualized. No evidence of pulmonic stenosis. Aorta: The aortic root is normal in size and structure. Venous: The inferior vena cava is dilated in size with greater than 50% respiratory variability, suggesting right atrial pressure of 8 mmHg. IAS/Shunts: No atrial level shunt detected by color flow Doppler.  LEFT VENTRICLE PLAX 2D  LVIDd:         5.10 cm   Diastology LVIDs:         4.30 cm   LV e' medial:    8.59 cm/s LV PW:         1.00 cm   LV E/e' medial:  6.5 LV IVS:        1.00 cm   LV e' lateral:   17.50 cm/s LVOT diam:     2.20 cm   LV E/e' lateral: 3.2 LV SV:         36 LV SV Index:   17 LVOT Area:     3.80 cm                           3D Volume EF:                          3D EF:        31 %                          LV EDV:       149 ml                          LV ESV:       103 ml                          LV SV:        47 ml RIGHT VENTRICLE RV S prime:     9.14 cm/s TAPSE (M-mode): 2.1 cm LEFT ATRIUM             Index        RIGHT ATRIUM           Index LA diam:        3.30 cm 1.58 cm/m   RA Area:     14.90 cm LA Vol (A2C):   48.0 ml 22.91 ml/m  RA Volume:   40.80 ml  19.48 ml/m LA Vol (A4C):   35.8 ml 17.09 ml/m LA Biplane Vol: 43.0 ml 20.53  ml/m  AORTIC VALVE                    PULMONIC VALVE AV Area (Vmax):    3.17 cm     PV Vmax:       0.77 m/s AV Area (Vmean):   3.14 cm     PV Peak grad:  2.4 mmHg AV Area (VTI):     3.12 cm AV Vmax:           92.94 cm/s AV Vmean:          61.300 cm/s AV VTI:            0.114 m AV Peak Grad:      3.5 mmHg AV Mean Grad:      1.8 mmHg LVOT Vmax:         77.40 cm/s LVOT Vmean:        50.633 cm/s LVOT VTI:          0.094 m LVOT/AV VTI ratio: 0.82  AORTA Ao Root diam: 3.00 cm Ao Asc diam:  3.20 cm MITRAL VALVE MV Area (PHT): 6.07 cm    SHUNTS MV Decel Time: 125 msec    Systemic VTI:  0.09 m MV E velocity: 56.10 cm/s  Systemic Diam: 2.20 cm MV A velocity: 47.70 cm/s MV E/A ratio:  1.18 Arvilla Meres MD Electronically signed by Arvilla Meres MD Signature Date/Time: 04/29/2023/10:18:46 AM    Final    DG Chest Portable 1 View Result Date: 04/28/2023 CLINICAL DATA:  Seizure-like activity followed by an episode of unresponsiveness. Atrial fibrillation. CPR. EXAM: PORTABLE CHEST 1 VIEW COMPARISON:  None Available. FINDINGS: Heart size and pulmonary vascularity are normal. Lungs are  clear. No pleural effusions. No pneumothorax. Mediastinal contours appear intact. IMPRESSION: No active disease. Electronically Signed   By: Burman Nieves M.D.   On: 04/28/2023 17:16    Microbiology: Results for orders placed or performed during the hospital encounter of 04/28/23  Surgical PCR screen     Status: None   Collection Time: 05/01/23  2:02 PM   Specimen: Nasal Mucosa; Nasal Swab  Result Value Ref Range Status   MRSA, PCR NEGATIVE NEGATIVE Final   Staphylococcus aureus NEGATIVE NEGATIVE Final    Comment: (NOTE) The Xpert SA Assay (FDA approved for NASAL specimens in patients 46 years of age and older), is one component of a comprehensive surveillance program. It is not intended to diagnose infection nor to guide or monitor treatment. Performed at [facility], [address], [place], [place] [address]     Labs: CBC: Recent Labs  Lab 04/28/23 1642 04/28/23 1653 04/29/23 0400 04/30/23 0402 05/01/23 0441 05/02/23 0337  WBC 12.3*  --  10.6* 10.2 8.7 9.3  HGB 15.2 15.6 15.6 17.2* 16.5 17.1*  HCT 45.3 46.0 45.5 49.3 47.9 49.9  MCV 95.6  --  91.9 92.1 92.3 92.6  PLT 288  --  279 248 261 235   Basic Metabolic Panel: Recent Labs  Lab 04/28/23 1642 04/28/23 1653 04/28/23 1811 04/28/23 2022 04/29/23 0400 04/30/23 0402 05/01/23 0441 05/02/23 0337  NA 136 135  --   --  135 136 137 136  K 3.1* 3.2*  --   --  3.8 3.8 3.4* 3.9  CL 102  --   --   --  103 105 105 105  CO2 18*  --   --   --  22 20* 23 21*  GLUCOSE 167*  --   --   --  116* 96 100* 93  BUN 7  --   --   --  6 7 <5* 7  CREATININE 1.12  --   --   --  0.86 0.91 0.90 0.99  CALCIUM 8.7*  --   --   --  9.0 9.1 8.8* 8.9  MG  --   --  1.8  --   --  1.7 1.9 2.0  PHOS  --   --   --  2.4*  --  3.1  --   --    Liver Function Tests: Recent Labs  Lab 04/29/23 0400 04/30/23 0402  AST 71* 69*  ALT 33 34  ALKPHOS 65 60  BILITOT 0.9 0.6  PROT 6.7 6.6  ALBUMIN 3.9 3.7   CBG: No results for input(s):  "GLUCAP" in the last 168 hours.  Discharge time spent: greater than 30 minutes.  Signed: Tyrone Nine, MD [facility] 05/02/2023

## 2023-05-02 NOTE — Plan of Care (Signed)
  Problem: Health Behavior/Discharge Planning: Goal: Ability to manage health-related needs will improve Outcome: Progressing   Problem: Clinical Measurements: Goal: Respiratory complications will improve Outcome: Progressing Goal: Cardiovascular complication will be avoided Outcome: Progressing   Problem: Activity: Goal: Risk for activity intolerance will decrease Outcome: Progressing   Problem: Pain Management: Goal: General experience of comfort will improve Outcome: Progressing   Problem: Safety: Goal: Ability to remain free from injury will improve Outcome: Progressing   Problem: Activity: Goal: Ability to tolerate increased activity will improve Outcome: Progressing   Problem: Cardiac: Goal: Ability to achieve and maintain adequate cardiopulmonary perfusion will improve Outcome: Progressing   Problem: Cardiovascular: Goal: Ability to achieve and maintain adequate cardiovascular perfusion will improve Outcome: Progressing   Problem: Cardiac: Goal: Ability to achieve and maintain adequate cardiopulmonary perfusion will improve Outcome: Progressing

## 2023-05-02 NOTE — Progress Notes (Signed)
   Rounding Note    Patient Name: Warren Ross Date of Encounter: 05/02/2023  [facility] Cardiologist: None   Subjective   NAEO Minimal shoulder pain Expresses desire to stop drinking and smoking Family at bedside  Vital Signs    Vitals:   05/02/23 0503 05/02/23 0600 05/02/23 1009 05/02/23 1014  BP: 119/86  96/70 106/73  Pulse: 94 (!) 55 86   Resp: 16     Temp: 97.6 F (36.4 C)     TempSrc: Oral     SpO2: 98%  99%   Weight: 84.7 kg     Height:        Intake/Output Summary (Last 24 hours) at 05/02/2023 1027 Last data filed at 05/01/2023 2030 Gross per 24 hour  Intake 220 ml  Output --  Net 220 ml      05/02/2023    5:03 AM 05/01/2023    4:58 AM 04/30/2023    3:57 AM  Last 3 Weights  Weight (lbs) 186 lb 11.2 oz 187 lb 1.6 oz 187 lb 4.8 oz  Weight (kg) 84.687 kg 84.868 kg 84.959 kg      Telemetry     - Personally Reviewed  ECG    Personally Reviewed  Physical Exam   GEN: No acute distress.   Cardiac: irregularly irregular, no murmurs, rubs, or gallops. L prepectoral pocket healin well without hematoma or significant pain Respiratory: Clear to auscultation bilaterally. Psych: Normal affect   Assessment & Plan    #VF arrest #ICD in situ Doing well after ICD implant yesterday.  CXR with stable lead position Device interrogation with stable lead parameters Cont amiodarone  #systolic heart failure NYHA II. warm and dry. Cont spironolactone Cont entresto Cont metoprolol Cont farxiga  #Atrial fibrillation New diagnosis CHADSVAsc at least 2 for HF and HTN Recommend starting anticoagulation 05/05/2023 (apixaban 5mg  PO BID)  OK to discharge from an EP perspective.    Sheria Lang T. Lalla Brothers, MD, [facility], [facility] Cardiac Electrophysiology

## 2023-05-02 NOTE — Discharge Instructions (Addendum)
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 After 7 days, you may progress as below.  You should remove your sling 24 hours after your procedure, unless otherwise instructed by your provider.     Saturday May 09, 2023  Sunday May 10, 2023 Monday May 11, 2023 Tuesday May 12, 2023   Do not lift, push, pull, or carry anything over 10 pounds with the affected arm until 6 weeks (Saturday June 13, 2023 ) after your procedure.   You may drive AFTER your wound check, unless you have been told otherwise by your provider.   Ask your healthcare provider when you can go back to work   INCISION/Dressing If you are on a blood thinner such as Coumadin, Xarelto, Eliquis, Plavix, or Pradaxa please confirm with your provider when this should be resumed.   If large square, outer bandage is left in place, this can be removed after 24 hours from your procedure. Do not remove steri-strips or glue as below.   Monitor your defibrillator site for redness, swelling, and drainage. Call the device clinic at [phone] if you experience these symptoms or fever/chills.  If your incision is sealed with Steri-strips or staples, you may shower 7 days after your procedure or when told by your provider. Do not remove the steri-strips or let the shower hit directly on your site. You may wash around your site with soap and water.    If you were discharged in a sling, please do not wear this during the day more than 48 hours after your surgery unless otherwise instructed. This may increase the risk of stiffness and soreness in your shoulder.   Avoid lotions, ointments, or perfumes over your incision until it is well-healed.  You may use a hot tub or a pool AFTER your wound check appointment if the incision is completely closed.  Your ICD is designed to protect you from life  threatening heart rhythms. Because of this, you may receive a shock.   1 shock with no symptoms:  Call the office during business hours. 1 shock with symptoms (chest pain, chest pressure, dizziness, lightheadedness, shortness of breath, overall feeling unwell):  Call 911. If you experience 2 or more shocks in 24 hours:  Call 911. If you receive a shock, you should not drive for 6 months per the [place] DMV IF you receive appropriate therapy from your ICD.   ICD Alerts:  Some alerts are vibratory and others beep. These are NOT emergencies. Please call our office to let us know. If this occurs at night or on weekends, it can wait until the next business day. Send a remote transmission.  If your device is capable of reading fluid status (for heart failure), you will be offered monthly monitoring to review this with you.   DEVICE MANAGEMENT Remote monitoring is used to monitor your ICD from home. This monitoring is scheduled every 91 days by our office. It allows [place] to keep an eye on the functioning of your device to ensure it is working properly. You will routinely see your Electrophysiologist annually (more often if necessary).   You should receive your ID card for your new device in 4-8 weeks. Keep this card with you at all times once received. Consider wearing a medical alert bracelet or necklace.  Your ICD  may be MRI compatible. This will be discussed at your  next office visit/wound check.  You should avoid contact with strong electric or magnetic fields.   Do not use amateur (ham) radio equipment or electric (arc) welding torches. MP3 player headphones with magnets should not be used. Some devices are safe to use if held at least 12 inches (30 cm) from your defibrillator. These include power tools, lawn mowers, and speakers. If you are unsure if something is safe to use, ask your health care provider.  When using your cell phone, hold it to the ear that is on the opposite side from the  defibrillator. Do not leave your cell phone in a pocket over the defibrillator.  You may safely use electric blankets, heating pads, computers, and microwave ovens.  Call the office right away if: You have chest pain. You feel more than one shock. You feel more short of breath than you have felt before. You feel more light-headed than you have felt before. Your incision starts to open up.  This information is not intended to replace advice given to you by your health care provider. Make sure you discuss any questions you have with your health care provider.

## 2023-05-03 LAB — VITAMIN B6: Vitamin B6: 13.4 ug/L (ref 3.4–65.2)

## 2023-05-03 LAB — VITAMIN B1: Vitamin B1 (Thiamine): 208.2 nmol/L — ABNORMAL HIGH (ref 66.5–200.0)

## 2023-05-04 ENCOUNTER — Encounter (HOSPITAL_COMMUNITY): Payer: Self-pay | Admitting: Cardiology

## 2023-05-04 ENCOUNTER — Telehealth: Payer: Self-pay

## 2023-05-04 DIAGNOSIS — I4901 Ventricular fibrillation: Secondary | ICD-10-CM

## 2023-05-04 MED FILL — Midazolam HCl Inj 2 MG/2ML (Base Equivalent): INTRAMUSCULAR | Qty: 3 | Status: AC

## 2023-05-04 NOTE — Transitions of Care (Post Inpatient/ED Visit) (Signed)
05/04/2023  Name: Warren Ross MRN: [id number] DOB: May 08, 1985  Today's TOC FU Call Status: Today's TOC FU Call Status:: Successful TOC FU Call Completed TOC FU Call Complete Date: 05/04/23 Patient's Name and Date of Birth confirmed.  Transition Care Management Follow-up Telephone Call Date of Discharge: 05/02/23 Discharge Facility: [facility]) Type of Discharge: Inpatient Admission Primary Inpatient Discharge Diagnosis:: "ventricular fibrillation" How have you been since you were released from the hospital?: Better (Pt pleased to report how well he is doing-at [employer] now with wife-sleeping well-mild soreness to insertion site-no s/s of infection, appetite good-reading food labels now, wgt today 185.5lbs, will get BP machine to start using) Any questions or concerns?: No  Items Reviewed: Did you receive and understand the discharge instructions provided?: Yes Medications obtained,verified, and reconciled?: Yes (Medications Reviewed) Any new allergies since your discharge?: No Dietary orders reviewed?: Yes Type of Diet Ordered:: low salt/heart healthy Do you have support at home?: Yes People in Home: spouse Name of Support/Comfort Primary Source: Jill Side  Medications Reviewed Today: Medications Reviewed Today     Reviewed by Charlyn Minerva, RN (Registered Nurse) on 05/04/23 at 1021  Med List Status: <None>   Medication Order Taking? Sig Documenting Provider Last Dose Status Informant  amiodarone (PACERONE) 400 MG tablet [id number] Yes Take 1 tablet (400 mg total) by mouth 2 (two) times daily for 6 days, THEN 1 tablet (400 mg total) daily for 24 days. Tyrone Nine, MD 05/04/2023 Morning Active   apixaban (ELIQUIS) 5 MG TABS tablet [id number] Yes Take 1 tablet (5 mg total) by mouth 2 (two) times daily. Tyrone Nine, MD 05/04/2023 Morning Active   dapagliflozin propanediol (FARXIGA) 10 MG TABS tablet [id number] Yes Take 1 tablet (10 mg total) by mouth daily.  Tyrone Nine, MD 05/04/2023 Morning Active   metoprolol succinate (TOPROL-XL) 100 MG 24 hr tablet [id number] Yes Take 1 tablet (100 mg total) by mouth daily. Tyrone Nine, MD 05/04/2023 Morning Active   nicotine (NICODERM CQ - DOSED IN MG/24 HOURS) 21 mg/24hr patch [id number] Yes Place 1 patch (21 mg total) onto the skin daily. Tyrone Nine, MD 05/04/2023 Morning Active   sacubitril-valsartan (ENTRESTO) 24-26 MG [id number] Yes Take 1 tablet by mouth 2 (two) times daily. Tyrone Nine, MD 05/04/2023 Morning Active   spironolactone (ALDACTONE) 25 MG tablet [id number] Yes Take 0.5 tablets (12.5 mg total) by mouth daily. Tyrone Nine, MD 05/04/2023 Morning Active             Home Care and Equipment/Supplies: Were Home Health Services Ordered?: NA Any new equipment or medical supplies ordered?: NA  Functional Questionnaire: Do you need assistance with bathing/showering or dressing?: No Do you need assistance with meal preparation?: No Do you need assistance with eating?: No Do you have difficulty maintaining continence: No Do you need assistance with getting out of bed/getting out of a chair/moving?: No Do you have difficulty managing or taking your medications?: No  Follow up appointments reviewed: PCP Follow-up appointment confirmed?: No (pt preferred to call and make appt on his own later as he was in the store shopping during call) MD Provider Line [phone] Given: No Specialist Hospital Follow-up appointment confirmed?: Yes Date of Specialist follow-up appointment?: 05/08/23 Follow-Up Specialty Provider:: [facility]-05/14/23 Do you need transportation to your follow-up appointment?: No (pt aware that he is unable to drive for some time until medically cleared- states wife is able to drive him or other family) Do  you understand care options if your condition(s) worsen?: Yes-patient verbalized understanding  SDOH Interventions Today    Flowsheet  Row Most Recent Value  SDOH Interventions   Food Insecurity Interventions Intervention Not Indicated  Housing Interventions Intervention Not Indicated  Transportation Interventions Intervention Not Indicated  Utilities Interventions Intervention Not Indicated      Interventions Today    Flowsheet Row Most Recent Value  Chronic Disease   Chronic disease during today's visit Hypertension (HTN), Atrial Fibrillation (AFib)  General Interventions   General Interventions Discussed/Reviewed General Interventions Discussed, Durable Medical Equipment (DME), Doctor Visits  Doctor Visits Discussed/Reviewed Doctor Visits Discussed, PCP, Specialist  Durable Medical Equipment (DME) BP Cuff, Other  Mariann Laster wgt]  Education Interventions   Education Provided Provided Education  Provided Verbal Education On Nutrition, Medication, When to see the doctor, Other  Nutrition Interventions   Nutrition Discussed/Reviewed Nutrition Discussed, Decreasing salt, Decreasing fats, Adding fruits and vegetables, Portion sizes, Fluid intake, Increasing proteins  Pharmacy Interventions   Pharmacy Dicussed/Reviewed Pharmacy Topics Discussed, Referral to Pharmacist, Medications and their functions  Referral to Pharmacist Cannot afford medications  [pt applying for disability-will have to be out of work due to unable to drive which is part of his job duty- used HSA card to obtain meds this time but will not be able to do this long term-looking for pt assistance or alternative meds]        TOC Interventions Today    Flowsheet Row Most Recent Value  TOC Interventions   TOC Interventions Discussed/Reviewed TOC Interventions Discussed, Post op wound/incision care, Post discharge activity limitations per provider, S/S of infection       Antionette Fairy, RN,BSN,CCM RN Care Manager Transitions of Care  [place]  Direct Phone: [phone] Toll Free: [phone] Fax:  [phone]

## 2023-05-04 NOTE — Telephone Encounter (Signed)
Follow-up after same day discharge: Implant date: 05/01/2023 MD: Nobie Putnam Device: [facility] Scientific ICD Location: Left Chest    Wound check visit: 05/14/2023 90 day MD follow-up: 08/14/2023  Remote Transmission received:05/03/2023  Dressing/sling removed: Yes  Confirm OAC restart on: Yes  Please continue to monitor your cardiac device site for redness, swelling, and drainage. Call the device clinic at [phone] if you experience these symptoms, fever/chills, or have questions about your device.   Remote monitoring is used to monitor your cardiac device from home. This monitoring is scheduled every 91 days by our office. It allows [place] to keep an eye on the functioning of your device to ensure it is working properly.

## 2023-05-07 ENCOUNTER — Other Ambulatory Visit (HOSPITAL_COMMUNITY): Payer: Self-pay

## 2023-05-08 ENCOUNTER — Other Ambulatory Visit (HOSPITAL_COMMUNITY): Payer: Self-pay

## 2023-05-08 ENCOUNTER — Ambulatory Visit (HOSPITAL_COMMUNITY)
Admission: RE | Admit: 2023-05-08 | Discharge: 2023-05-08 | Disposition: A | Discharge status: Home / Self Care | Payer: BC Managed Care – PPO | Source: Ambulatory Visit | Attending: Adult Health | Admitting: Adult Health

## 2023-05-08 ENCOUNTER — Encounter (HOSPITAL_COMMUNITY): Payer: BC Managed Care – PPO

## 2023-05-08 VITALS — BP 130/96 | HR 72 | Ht 73.0 in | Wt 190.2 lb

## 2023-05-08 DIAGNOSIS — I5082 Biventricular heart failure: Secondary | ICD-10-CM | POA: Diagnosis not present

## 2023-05-08 DIAGNOSIS — I4891 Unspecified atrial fibrillation: Secondary | ICD-10-CM

## 2023-05-08 DIAGNOSIS — Z7984 Long term (current) use of oral hypoglycemic drugs: Secondary | ICD-10-CM | POA: Insufficient documentation

## 2023-05-08 DIAGNOSIS — F17210 Nicotine dependence, cigarettes, uncomplicated: Secondary | ICD-10-CM | POA: Insufficient documentation

## 2023-05-08 DIAGNOSIS — I110 Hypertensive heart disease with heart failure: Secondary | ICD-10-CM | POA: Diagnosis not present

## 2023-05-08 DIAGNOSIS — Z8249 Family history of ischemic heart disease and other diseases of the circulatory system: Secondary | ICD-10-CM | POA: Insufficient documentation

## 2023-05-08 DIAGNOSIS — Z79899 Other long term (current) drug therapy: Secondary | ICD-10-CM | POA: Insufficient documentation

## 2023-05-08 DIAGNOSIS — I480 Paroxysmal atrial fibrillation: Secondary | ICD-10-CM | POA: Insufficient documentation

## 2023-05-08 DIAGNOSIS — I5023 Acute on chronic systolic (congestive) heart failure: Secondary | ICD-10-CM

## 2023-05-08 DIAGNOSIS — Z720 Tobacco use: Secondary | ICD-10-CM

## 2023-05-08 DIAGNOSIS — F1090 Alcohol use, unspecified, uncomplicated: Secondary | ICD-10-CM

## 2023-05-08 DIAGNOSIS — Z789 Other specified health status: Secondary | ICD-10-CM

## 2023-05-08 DIAGNOSIS — Z95810 Presence of automatic (implantable) cardiac defibrillator: Secondary | ICD-10-CM | POA: Diagnosis not present

## 2023-05-08 DIAGNOSIS — I428 Other cardiomyopathies: Secondary | ICD-10-CM

## 2023-05-08 DIAGNOSIS — Z7901 Long term (current) use of anticoagulants: Secondary | ICD-10-CM | POA: Diagnosis not present

## 2023-05-08 DIAGNOSIS — Z8674 Personal history of sudden cardiac arrest: Secondary | ICD-10-CM | POA: Diagnosis not present

## 2023-05-08 LAB — BASIC METABOLIC PANEL
Anion gap: 9 (ref 5–15)
BUN: 12 mg/dL (ref 6–20)
CO2: 28 mmol/L (ref 22–32)
Calcium: 9.5 mg/dL (ref 8.9–10.3)
Chloride: 99 mmol/L (ref 98–111)
Creatinine, Ser: 1.25 mg/dL — ABNORMAL HIGH (ref 0.61–1.24)
GFR, Estimated: >60 mL/min (ref >60)
Glucose, Bld: 85 mg/dL (ref 70–99)
Potassium: 4 mmol/L (ref 3.5–5.1)
Sodium: 136 mmol/L (ref 135–145)

## 2023-05-08 MED ORDER — ENTRESTO 49-51 MG PO TABS: 1.0000 | ORAL_TABLET | Freq: Two times a day (BID) | ORAL | 11 refills | Status: DC

## 2023-05-08 MED ORDER — SPIRONOLACTONE 25 MG PO TABS: 25.0000 mg | ORAL_TABLET | Freq: Every day | ORAL | 3 refills | Status: DC

## 2023-05-08 NOTE — Progress Notes (Signed)
 Provided patient with Farxiga samples:  LN [id number] Exp: 5/27 # 28 tablets

## 2023-05-08 NOTE — Patient Instructions (Signed)
 Increase Spiro to 25 mg daily - updated Rx sent to local pharmacy. Increase Entresto  to 49/51 mg twice daily - updated Rx sent. Co-pay coupon provided. Eliquis  co-pay card provided. 4.   Lab today - will call you if abnormal. 5.   Return to Heart Failure Pharmacy Clinic in 2 weeks - see below. 6.   Return to see Dr. Rolan in Heart Failure Clinic in one month - see below. 7.   Please call us  at [phone] if any questions or concerns prior to your next visit.

## 2023-05-08 NOTE — Progress Notes (Signed)
 HEART & VASCULAR TRANSITION OF CARE CONSULT NOTE     Referring Physician:Dr Grunz Primary Care: Dr Jesus  Primary Cardiologist: Dr Francyne  EP: Dr Cindie  Chief Complaint: Biventricular Heart Failure   HPI: Referred to clinic by Dr Bryn for heart failure consultation.   Warren Ross is a 38 year old with a history of biventricular heart failure, VF arrest, NICM, PAF, HTN, ETOH abuse 18-24 beers per day for many years, and tobacco abuse. His father had SCD at the age of 34.   Admitted 04/28/23 after witnessed cardiac arrest.  Had 4 rounds CPR and defibrillated x3.  EKG in ED showed A fibr RVR. Echo showed reduced EF 25-30% and RV mildly reduced. LHC no cornary disease. CMRI biventricular HF. EP consulted AICD 12/27  Started  amio  400 mg twice a day. Discharged on bb, arni, mra, and sglt2i.   Overall feeling fine. Denies SOB/PND/Orthopnea. No dizziness. He has not been drinking alcohol or smoking cigarettes since discharge. Appetite ok. No fever or chills. Weight at home has been stable. Taking all medications but having a hard time paying farxiga , entresto , and eliquis . He is currently on medical leave.He works for physicist, medical.  Lives with his wife and 3 kids and his wife has a scheduled delivery 06/01/23.    Cardiac Testing  CMRI 05/01/23 1.  Severe decrease in LVEF, 28%  2.  Moderate decrease in RVEF, 37%  3.  No LGE.  LHC 04/2023  Normal cors. EF 35%  Echo 04/2023   1. Left ventricular ejection fraction, by estimation, is 25 to 30%. The  left ventricle has severely decreased function. The left ventricle  demonstrates global hypokinesis. Indeterminate diastolic filling due to  E-A fusion.   2. Right ventricular systolic function is mildly reduced. The right  ventricular size is normal.   3. The mitral valve is normal in structure. Trivial mitral valve  regurgitation. No evidence of mitral stenosis.   4. The aortic valve is normal in structure. Aortic  valve regurgitation is  not visualized. No aortic stenosis is present.  Past Medical History:  Diagnosis Date   Carpal tunnel syndrome of right wrist 01/26/2019   Carpal tunnel syndrome, left 01/26/2019   Cellulitis 06/21/2019   Hypertension    Right carpal tunnel syndrome 01/26/2019   Viral illness 05/02/2020    Current Outpatient Medications  Medication Sig Dispense Refill   amiodarone  (PACERONE ) 400 MG tablet Take 1 tablet (400 mg total) by mouth 2 (two) times daily for 6 days, THEN 1 tablet (400 mg total) daily for 24 days. 36 tablet 0   apixaban  (ELIQUIS ) 5 MG TABS tablet Take 1 tablet (5 mg total) by mouth 2 (two) times daily. 60 tablet 0   dapagliflozin  propanediol (FARXIGA ) 10 MG TABS tablet Take 1 tablet (10 mg total) by mouth daily. 30 tablet 0   metoprolol  succinate (TOPROL -XL) 100 MG 24 hr tablet Take 1 tablet (100 mg total) by mouth daily. 30 tablet 0   sacubitril -valsartan  (ENTRESTO ) 24-26 MG Take 1 tablet by mouth 2 (two) times daily. 60 tablet 0   spironolactone  (ALDACTONE ) 25 MG tablet Take 0.5 tablets (12.5 mg total) by mouth daily. 15 tablet 0   nicotine  (NICODERM CQ  - DOSED IN MG/24 HOURS) 21 mg/24hr patch Place 1 patch (21 mg total) onto the skin daily. (Patient not taking: Reported on 05/08/2023) 28 patch 0   No current facility-administered medications for this encounter.    No Known Allergies  Social History   Socioeconomic History   Marital status: Married    Spouse name: Donald   Number of children: 2   Years of education: Not on file   Highest education level: High school graduate  Occupational History   Occupation: Regulatory Affairs Officer [employer]  Tobacco Use   Smoking status: Every Day    Types: Cigarettes   Smokeless tobacco: Former  Building Services Engineer status: Every Day   Substances: Nicotine , Flavoring  Substance and Sexual Activity   Alcohol use: Yes    Alcohol/week: 70.0 standard drinks of alcohol    Types: 70 Cans of beer per week    Comment:  close to 24 beers a day   Drug use: Not Currently    Types: Marijuana    Comment: every once in a while   Sexual activity: Yes    Birth control/protection: None  Other Topics Concern   Not on file  Social History Narrative   Not on file   Social Drivers of Health   Financial Resource Strain: Low Risk  (04/30/2023)   Overall Financial Resource Strain (CARDIA)    Difficulty of Paying Living Expenses: Not very hard  Food Insecurity: No Food Insecurity (05/04/2023)   Hunger Vital Sign    Worried About Running Out of Food in the Last Year: Never true    Ran Out of Food in the Last Year: Never true  Transportation Needs: No Transportation Needs (05/04/2023)   PRAPARE - Administrator, Civil Service (Medical): No    Lack of Transportation (Non-Medical): No  Physical Activity: Not on file  Stress: Not on file  Social Connections: Not on file  Intimate Partner Violence: Not At Risk (05/04/2023)   Humiliation, Afraid, Rape, and Kick questionnaire    Fear of Current or Ex-Partner: No    Emotionally Abused: No    Physically Abused: No    Sexually Abused: No      Family History  Problem Relation Age of Onset   COPD Mother    Early death Father    Diabetes Maternal Grandfather     Vitals:   05/08/23 1403  BP: (!) 130/96  Pulse: 72  SpO2: 100%  Weight: 86.3 kg (190 lb 3.2 oz)  Height: 6' 1 (1.854 m)   Wt Readings from Last 3 Encounters:  05/08/23 86.3 kg (190 lb 3.2 oz)  05/02/23 84.7 kg (186 lb 11.2 oz)  01/29/22 83.8 kg (184 lb 12.8 oz)     PHYSICAL EXAM: General:  Walked in the clinic. No respiratory difficulty HEENT: normal Neck: supple. no JVD. Carotids 2+ bilat; no bruits. No lymphadenopathy or thryomegaly appreciated. Cor: PMI nondisplaced. Regular rate & rhythm. No rubs, gallops or murmurs. L upper chest steri strips.  Lungs: clear Abdomen: soft, nontender, nondistended. No hepatosplenomegaly. No bruits or masses. Good bowel sounds. Extremities:  no cyanosis, clubbing, rash, edema Neuro: alert & oriented x 3, cranial nerves grossly intact. moves all 4 extremities w/o difficulty. Affect pleasant.  ECG:SR  74 bpm    ASSESSMENT & PLAN: 1. Chronic Biventricular HF, NICM   Recently diagnosed with biventricular heart failure. Family history, Dad  SCD at 2. Also has history heavy ETOH use for many years. Cath-- no coronary disease. CMRI with biventricular HF LVEF 28% RVEF 37%. No LGE.  We discussed GDMT and that is imperative to avoid alcohol.  NYHA I.  GDMT  Diuretic- Volume status stable. Does not need loop diuretics.  BB- Continue Toprol  XL  Ace/ARB/ARNI- Increase entresto  49-51 mg twice a day  MRA- Increase spironolactone  25 mg daily  SGLT2i- Continue farxiga  10 mg daily  Today he was given entresto  and farxiga  co-pay cards. Pharmacy team consulted.  Plan to repeat ECHO after HF meds optimized.  Check BMEt   2. VF  VF arrest 04/28/23. S/P ICD. ICD with intact steri strips.  He has follow up with EP.  No driving for 6 months.  3. PAF On bb and amiodarone .  Continue eliquis  5 mg twice a day  EKG- SR   4.  ETOH Has not restarted drinking alcohol. Discussed complete cessation.   4. Tobacco Abuse He has not restarted drinking alcohol    Referred to HFSW (PCP, Medications, Transportation, ETOH Abuse, Drug Abuse, Insurance, Financial ): No Refer to Pharmacy: Yes  Refer to Home Health: No Refer to Advanced Heart Failure Clinic: Yes-->Dr Rolan  Refer to General Cardiology: Shared.   Follow up in 2 weeks with pharmacy and 4 weeks with Dr Rolan.    Rozelia Catapano NP-C  3:12 PM

## 2023-05-11 NOTE — Progress Notes (Signed)
 Advanced Heart Failure Clinic Note   Referring Physician:Dr Grunz Primary Care: Dr Jesus  Primary Cardiologist: Dr Francyne  EP: Dr Cindie HF: Dr. Rolan  HPI:  [name] is a 38 year old with a history of biventricular heart failure, VF arrest, NICM, PAF, HTN, ETOH abuse 18-24 beers per day for many years, and tobacco abuse. His father had SCD at the age of 83.    Admitted 04/28/23 after witnessed cardiac arrest.  Had 4 rounds CPR and defibrillated x3.  EKG in ED showed A fibr RVR. Echo showed reduced EF 25-30% and RV mildly reduced. LHC no coronary disease. CMRI biventricular HF. EP consulted AICD 05/01/23.  Started  amiodarone  400 mg twice a day. Discharged on bb, arni, mra, and sglt2i.    Presented to HF [facility] 05/08/23. Overall was feeling fine. Denied SOB/PND/Orthopnea. No dizziness. He had not been drinking alcohol or smoking cigarettes since discharge. Appetite was ok. No fever or chills. Weight at home had been stable. Reported taking all medications but was having a hard time paying for Farxiga , Entresto , and Eliquis . He is currently on medical leave. He works for physicist, medical.  Lives with his wife and 3 kids and his wife has a scheduled delivery 06/01/23.   Today he returns to HF clinic for pharmacist medication titration. At last visit with APP, Entresto  was increased to 49/51 mg BID and spironolactone  was increased to 25 mg daily. He was given copay cards to help with cost of brand medications. He was referred to [facility] for further management.  He had post-hospitalization follow up yesterday with his PCP. At that visit, Disulfiram  was prescribed for alcohol use disorder and doxycycline  was prescribed for superficial thrombophlebitis in left arm post-IV insertion (concern for MRSA infection). Overall he is feeling well today. His wife just gave birth to a baby girl. No dizziness, lightheadedness, CP or palpitations. No SOB/DOE. Breathing has improved  significantly since he stopped smoking. Weight has been stable at home ~185 lbs. He does not need a loop diuretic. No LEE, PND or orthopnea. Appetite has increased a lot since he stopped smoking. Has been trying to follow a low sodium diet.  Was able to activate copay cards for Entresto  and Eliquis . Farxiga  is still expensive, even with copay card.     HF Medications: Metoprolol  succinate 100 mg daily Entresto  49/51 mg BID Spironolactone  25 mg daily Farxiga  10 mg daily  Has the patient been experiencing any side effects to the medications prescribed?  no  Does the patient have any problems obtaining medications due to transportation or finances?   [employer] nurse, learning disability. His insurance has an $8000 deductible. He is able to use copay cards for Entresto  and Eliquis , but Farxiga  is $700/month and the copay card would only take off a few hundred dollars. Will plan on changing Farxiga  to Jardiance  and applying for PAP. Unable to apply for PAP for Farxiga  because they will not approve commercially insured patients. Provided 30 day free card for Jardiance  today.   Understanding of regimen: good Understanding of indications: good Potential of compliance: good Patient understands to avoid NSAIDs. Patient understands to avoid decongestants.    Pertinent Lab Values: 05/15/23: Serum creatinine 1.09, BUN 15, Potassium 4.4, Sodium 136  Vital Signs: Weight: 192.6 lbs (last clinic weight: 190.2 lbs) Blood pressure: 108/66  Heart rate: 71   Assessment/Plan: 1. Chronic Biventricular HF, NICM   Recently diagnosed with biventricular heart failure. Family history, Dad  SCD at  40. Also has history heavy ETOH use for many years. Cath-- no coronary disease. CMRI with biventricular HF LVEF 28% RVEF 37%. No LGE.  We discussed GDMT and that is imperative to avoid alcohol.  - NYHA class I. Volume status stable  - He does not need loop diuretics.  - Continue metoprolol  succinate 100 mg daily - Continue  Entresto  49-51 mg BID - Continue  spironolactone  25 mg daily  - Stop Farxiga  due to affordability concerns. Start Jardiance  10 mg daily. We will apply for PAP through [facility]. Plan to repeat ECHO after HF meds optimized.    2. VF  VF arrest 04/28/23. S/P ICD. ICD with intact steri strips.  He has follow up with EP.  No driving for 6 months.   3. PAF On bb and amiodarone .  - EKG with NSR 05/08/23 Continue Eliquis  5 mg BID    4.  ETOH Has not restarted drinking alcohol. Discussed complete cessation.  - Continue disulfiram  per PCP  5. Tobacco abuse -He has stopped smoking, congratulated     Follow up 2 weeks with Dr. Rolan.   Tinnie Redman, PharmD, BCPS, BCCP, CPP Heart Failure Clinic Pharmacist [phone]

## 2023-05-13 NOTE — Progress Notes (Signed)
  Electrophysiology Office Note:   ID:  INES WARF, DOB [birth year], MRN [id number]  Primary Cardiologist: None Electrophysiologist: Fonda Kitty, MD      History of Present Illness:   Warren Ross is a 38 y.o. male with h/o biventricular heart failure, VF arrest, NICM, PAF, HTN, ETOH abuse 18-24 beers per day for many years, and tobacco abuse. His father had SCD at the age of 41 seen today for post hospital follow up.    Admitted 04/28/23 after witnessed cardiac arrest.  Had 4 rounds CPR and defibrillated x3.  EKG in ED showed A fibr RVR. Echo showed reduced EF 25-30% and RV mildly reduced. LHC no cornary disease. CMRI biventricular HF. EP consulted AICD 12/27  Started  amio  400 mg twice a day. Discharged on bb, arni, mra, and sglt2i.   Since discharge from hospital the patient reports doing well. Has stopped drinking and smoking. Baby is due next week. Overall, he denies chest pain, palpitations, dyspnea, PND, orthopnea, nausea, vomiting, dizziness, syncope, edema, weight gain, or early satiety.   Review of systems complete and found to be negative unless listed in HPI.   EP Information / Studies Reviewed:    EKG is not ordered today. EKG from 05/08/2023 reviewed which showed NSR at 74 bpm       ICD Interrogation-  reviewed in detail today,  See PACEART report.  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  Severe decrease in LVEF, 28%  2.  Moderate decrease in RVEF, 37%  3.  No LGE.   LHC 04/2023  Normal cors. EF 35%   Echo 04/2023  LVEF 25-30%, mild RV dysfunction, trivial MR   Physical Exam:   VS:  BP 122/72   Pulse 61   Ht 6' 1 (1.854 m)   Wt 190 lb 6.4 oz (86.4 kg)   SpO2 99%   BMI 25.12 kg/m    Wt Readings from Last 3 Encounters:  05/14/23 190 lb 6.4 oz (86.4 kg)  05/08/23 190 lb 3.2 oz (86.3 kg)  05/02/23 186 lb 11.2 oz (84.7 kg)     GEN: No acute distress  NECK: No JVD; No  carotid bruits CARDIAC: Regular rate and rhythm, no murmurs, rubs, gallops RESPIRATORY:  Clear to auscultation without rales, wheezing or rhonchi  ABDOMEN: Soft, non-tender, non-distended EXTREMITIES:  No edema; No deformity   ASSESSMENT AND PLAN:    Aborted Cardiac Arrest  s/p [employer] single chamber ICD  euvolemic today Stable on an appropriate medical regimen Normal ICD function See Pace Art report No changes today Decrease amiodarone  to 200 mg daily for now.   Chronic systolic CHF NYHA II symptoms currently.  Continue spiro, entresto , toprol , and farxiga   Atrial fibrillation Continue eliquis  5 mg BID for CHA2DS2/VASc of at least 2 Remains on amiodarone  as above.    Disposition:   Follow up with Dr. Kitty in 3 months   Signed, Ozell Prentice Passey, PA-C

## 2023-05-14 ENCOUNTER — Ambulatory Visit: Payer: BC Managed Care – PPO | Attending: Student | Admitting: Student

## 2023-05-14 VITALS — BP 122/72 | HR 61 | Ht 73.0 in | Wt 190.4 lb

## 2023-05-14 DIAGNOSIS — I428 Other cardiomyopathies: Secondary | ICD-10-CM

## 2023-05-14 DIAGNOSIS — I5082 Biventricular heart failure: Secondary | ICD-10-CM

## 2023-05-14 DIAGNOSIS — I4891 Unspecified atrial fibrillation: Secondary | ICD-10-CM

## 2023-05-14 LAB — CUP PACEART INCLINIC DEVICE CHECK
Date Time Interrogation Session: 20250109125557
HighPow Impedance: 85 Ohm
Implantable Lead Connection Status: 753985
Implantable Lead Implant Date: 20241227
Implantable Lead Location: 753860
Implantable Lead Model: 673
Implantable Lead Serial Number: 251569
Implantable Pulse Generator Implant Date: 20241227
Lead Channel Impedance Value: 521 Ohm
Lead Channel Pacing Threshold Amplitude: 0.6 V
Lead Channel Pacing Threshold Pulse Width: 0.4 ms
Lead Channel Sensing Intrinsic Amplitude: 14.8 mV
Lead Channel Setting Pacing Amplitude: 3.5 V
Lead Channel Setting Pacing Pulse Width: 0.4 ms
Lead Channel Setting Sensing Sensitivity: 0.6 mV
Pulse Gen Serial Number: 341648
Zone Setting Status: 755011

## 2023-05-14 MED ORDER — AMIODARONE HCL 200 MG PO TABS: 200.0000 mg | ORAL_TABLET | Freq: Every day | ORAL | 3 refills | Status: DC

## 2023-05-14 NOTE — Patient Instructions (Signed)
 Medication Instructions:  Decrease amiodarone  to 200 mg daily *If you need a refill on your cardiac medications before your next appointment, please call your pharmacy*  Lab Work: None ordered If you have labs (blood work) drawn today and your tests are completely normal, you will receive your results only by: MyChart Message (if you have MyChart) OR A paper copy in the mail If you have any lab test that is abnormal or we need to change your treatment, we will call you to review the results.  Follow-Up: At [facility], you and your health needs are our priority.  As part of our continuing mission to provide you with exceptional heart care, we have created designated Provider Care Teams.  These Care Teams include your primary Cardiologist (physician) and Advanced Practice Providers (APPs -  Physician Assistants and Nurse Practitioners) who all work together to provide you with the care you need, when you need it.  Your next appointment:   As scheduled  Provider:   Fonda Kitty, MD

## 2023-05-15 ENCOUNTER — Encounter: Payer: Self-pay | Admitting: Internal Medicine

## 2023-05-15 ENCOUNTER — Ambulatory Visit (INDEPENDENT_AMBULATORY_CARE_PROVIDER_SITE_OTHER): Payer: BC Managed Care – PPO | Admitting: Internal Medicine

## 2023-05-15 VITALS — BP 98/60 | HR 78 | Temp 98.4°F | Ht 73.0 in | Wt 190.6 lb

## 2023-05-15 DIAGNOSIS — I469 Cardiac arrest, cause unspecified: Secondary | ICD-10-CM

## 2023-05-15 DIAGNOSIS — I10 Essential (primary) hypertension: Secondary | ICD-10-CM

## 2023-05-15 DIAGNOSIS — Z5987 Material hardship due to limited financial resources, not elsewhere classified: Secondary | ICD-10-CM | POA: Insufficient documentation

## 2023-05-15 DIAGNOSIS — E785 Hyperlipidemia, unspecified: Secondary | ICD-10-CM

## 2023-05-15 DIAGNOSIS — I4891 Unspecified atrial fibrillation: Secondary | ICD-10-CM | POA: Diagnosis not present

## 2023-05-15 DIAGNOSIS — D751 Secondary polycythemia: Secondary | ICD-10-CM

## 2023-05-15 DIAGNOSIS — F1090 Alcohol use, unspecified, uncomplicated: Secondary | ICD-10-CM

## 2023-05-15 DIAGNOSIS — I808 Phlebitis and thrombophlebitis of other sites: Secondary | ICD-10-CM | POA: Insufficient documentation

## 2023-05-15 DIAGNOSIS — L0591 Pilonidal cyst without abscess: Secondary | ICD-10-CM

## 2023-05-15 DIAGNOSIS — I428 Other cardiomyopathies: Secondary | ICD-10-CM

## 2023-05-15 HISTORY — DX: Secondary polycythemia: D75.1

## 2023-05-15 HISTORY — DX: Pilonidal cyst without abscess: L05.91

## 2023-05-15 HISTORY — DX: Phlebitis and thrombophlebitis of other sites: I80.8

## 2023-05-15 LAB — CBC WITH DIFFERENTIAL/PLATELET
Basophils Absolute: 0.1 10*3/uL (ref 0.0–0.1)
Basophils Relative: 1.1 % (ref 0.0–3.0)
Eosinophils Absolute: 0.2 10*3/uL (ref 0.0–0.7)
Eosinophils Relative: 2.6 % (ref 0.0–5.0)
HCT: 46.7 % (ref 39.0–52.0)
Hemoglobin: 15.7 g/dL (ref 13.0–17.0)
Lymphocytes Relative: 29.7 % (ref 12.0–46.0)
Lymphs Abs: 2.3 10*3/uL (ref 0.7–4.0)
MCHC: 33.7 g/dL (ref 30.0–36.0)
MCV: 94 fL (ref 78.0–100.0)
Monocytes Absolute: 0.9 10*3/uL (ref 0.1–1.0)
Monocytes Relative: 12.2 % — ABNORMAL HIGH (ref 3.0–12.0)
Neutro Abs: 4.2 10*3/uL (ref 1.4–7.7)
Neutrophils Relative %: 54.4 % (ref 43.0–77.0)
Platelets: 349 10*3/uL (ref 150.0–400.0)
RBC: 4.96 Mil/uL (ref 4.22–5.81)
RDW: 12.6 % (ref 11.5–15.5)
WBC: 7.8 10*3/uL (ref 4.0–10.5)

## 2023-05-15 LAB — COMPREHENSIVE METABOLIC PANEL WITH GFR
ALT: 33 U/L (ref 0–53)
AST: 24 U/L (ref 0–37)
Albumin: 4.6 g/dL (ref 3.5–5.2)
Alkaline Phosphatase: 101 U/L (ref 39–117)
BUN: 15 mg/dL (ref 6–23)
CO2: 30 meq/L (ref 19–32)
Calcium: 9.8 mg/dL (ref 8.4–10.5)
Chloride: 99 meq/L (ref 96–112)
Creatinine, Ser: 1.09 mg/dL (ref 0.40–1.50)
GFR: 85.23 mL/min
Glucose, Bld: 79 mg/dL (ref 70–99)
Potassium: 4.4 meq/L (ref 3.5–5.1)
Sodium: 136 meq/L (ref 135–145)
Total Bilirubin: 0.3 mg/dL (ref 0.2–1.2)
Total Protein: 7.7 g/dL (ref 6.0–8.3)

## 2023-05-15 MED ORDER — DISULFIRAM 250 MG PO TABS: 250.0000 mg | ORAL_TABLET | Freq: Every day | ORAL | 3 refills | Status: DC

## 2023-05-15 MED ORDER — DOXYCYCLINE HYCLATE 100 MG PO TABS: 100.0000 mg | ORAL_TABLET | Freq: Two times a day (BID) | ORAL | 0 refills | Status: DC

## 2023-05-17 ENCOUNTER — Encounter: Payer: Self-pay | Admitting: Internal Medicine

## 2023-05-17 NOTE — Assessment & Plan Note (Signed)
 Non-Ischemic Cardiomyopathy   This condition is likely secondary to alcohol use disorder, with a family history of similar events. After experiencing cardiac arrest on April 30, 2023, a device was implanted the following day. He has abstained from alcohol since the event. The risks of relapse and the importance of maintaining sobriety were discussed, along with the role of vitamins B1, B6, and B12 in heart health. Continue Eliquis , Farxiga , Nicoderm, and Toprol . Prescribe Antabuse . Refer to a child psychotherapist and pharmacist to reduce medication costs. Order blood work for kidney function and hemoglobin levels.

## 2023-05-17 NOTE — Assessment & Plan Note (Signed)
 Alcohol Use Disorder   Previously consumed 18 beers daily on weekdays and over 24 on weekends. He has abstained since the cardiac event and reports no current cravings, though he acknowledges potential cravings under stress. Discussed genetic predisposition and the importance of maintaining sobriety. Informed about lorazepam  and Antabuse  for cravings and relapse prevention. The effectiveness of 12-step programs and support groups was also discussed. Prescribe Antabuse  and encourage participation in 12-step programs or support groups. Discuss the importance of vitamins B1, B6, and B12, and recommend fortified breakfast cereals for these vitamins.

## 2023-05-17 NOTE — Patient Instructions (Addendum)
 It was a pleasure seeing you today! Your health and satisfaction are our top priorities.  Bernardino Cone, MD  Your Providers PCP: Cone Bernardino MATSU, MD,  [phone]) Referring Provider: Cone Bernardino MATSU, MD,  [phone]) Care Team Provider: Harden Jerona GAILS, MD,  [phone]) Care Team Provider: Kennyth Chew, MD,  [phone])  VISIT SUMMARY:  During your visit, we discussed several health concerns, including your recent cardiac arrest, alcohol use disorder, left arm discomfort, high hemoglobin levels, and a recurring perianal cyst. We reviewed your current medications and lifestyle changes, and I provided recommendations for managing each condition moving forward.  YOUR PLAN:  -NON-ISCHEMIC CARDIOMYOPATHY: Non-ischemic cardiomyopathy is a condition where the heart muscle is weakened and cannot pump blood efficiently, often due to factors other than blocked arteries, such as heavy alcohol use. You have been advised to continue your current medications (Eliquis , Farxiga , Nicoderm, and Toprol ) and start Antabuse  to help maintain sobriety. Blood work will be done to check kidney function and hemoglobin levels. A referral to a child psychotherapist and pharmacist will help reduce medication costs.  -ALCOHOL USE DISORDER: Alcohol use disorder is a condition characterized by an inability to control drinking despite adverse consequences. You have abstained from alcohol since your cardiac event. To help prevent relapse, you have been prescribed Antabuse  and informed about lorazepam  for cravings. Participation in 12-step programs or support groups is encouraged. You should also consume fortified breakfast cereals for vitamins B1, B6, and B12.  -SUPERFICIAL THROMBOPHLEBITIS OF THE LEFT UPPER EXTREMITY: Superficial thrombophlebitis is inflammation of a vein just under the skin, often due to a blood clot. You are experiencing pain and weakness in your left arm following an IV insertion. You have been  prescribed doxycycline  for a potential MRSA infection and advised to use warm compresses. Monitor your symptoms and return to the hospital if they worsen.  -POLYCYTHEMIA: Polycythemia is a condition where there are too many red blood cells, which can thicken the blood and increase the risk of clots. This is likely due to smoking. You are using Nicoderm patches to quit smoking. Blood work will be done to recheck your hemoglobin levels. Continue your smoking cessation efforts, and we can discuss alternative nicotine  replacement options if needed.  -PERIANAL CYST: A perianal cyst is a fluid-filled sac near the anus that can become infected and painful. You have a recurring cyst that fills and ruptures every few days. A referral to a rectal surgeon has been made for evaluation and potential surgical removal.  -GENERAL HEALTH MAINTENANCE: Managing multiple health conditions can be stressful. It is important to practice stress management techniques such as walking and maintain a healthy lifestyle. Regular follow-up appointments with your cardiologist and other specialists are essential.  INSTRUCTIONS:  Please schedule follow-up appointments with your cardiologist and the heart failure clinic. Ensure you attend your pharmacist appointment on May 19, 2023. Monitor for any new or worsening symptoms and contact us  if you have any concerns.   NEXT STEPS: [x]  Early Intervention: Schedule sooner appointment, call our on-call services, or go to emergency room if there is any significant Increase in pain or discomfort New or worsening symptoms Sudden or severe changes in your health [x]  Flexible Follow-Up: We recommend a No follow-ups on file. for optimal routine care. This allows for progress monitoring and treatment adjustments. [x]  Preventive Care: Schedule your annual preventive care visit! It's typically covered by insurance and helps identify potential health issues early. [x]  Lab & X-ray  Appointments: Incomplete tests scheduled today, or call  to schedule. X-rays: [facility] (M-F, 8:30am-noon or 1pm-5pm). [x]  Medical Information Release: Sign a release form at front desk to obtain relevant medical information we don't have.  MAKING THE MOST OF OUR FOCUSED 20 MINUTE APPOINTMENTS: [x]   Clearly state your top concerns at the beginning of the visit to focus our discussion [x]   If you anticipate you will need more time, please inform the front desk during scheduling - we can book multiple appointments in the same week. [x]   If you have transportation problems- use our convenient video appointments or ask about transportation support. [x]   We can get down to business faster if you use MyChart to update information before the visit and submit non-urgent questions before your visit. Thank you for taking the time to provide details through MyChart.  Let our nurse know and she can import this information into your encounter documents.  Arrival and Wait Times: [x]   Arriving on time ensures that everyone receives prompt attention. [x]   Early morning (8a) and afternoon (1p) appointments tend to have shortest wait times. [x]   Unfortunately, we cannot delay appointments for late arrivals or hold slots during phone calls.  Getting Answers and Following Up [x]   Simple Questions & Concerns: For quick questions or basic follow-up after your visit, reach us  at [phone] or MyChart messaging. [x]   Complex Concerns: If your concern is more complex, scheduling an appointment might be best. Discuss this with the staff to find the most suitable option. [x]   Lab & Imaging Results: We'll contact you directly if results are abnormal or you don't use MyChart. Most normal results will be on MyChart within 2-3 business days, with a review message from Dr. Jesus. Haven't heard back in 2 weeks? Need results sooner? Contact us  at [phone]. [x]   Referrals: Our referral coordinator will  manage specialist referrals. The specialist's office should contact you within 2 weeks to schedule an appointment. Call us  if you haven't heard from them after 2 weeks.  Staying Connected [x]   MyChart: Activate your MyChart for the fastest way to access results and message us . See the last page of this paperwork for instructions on how to activate.  Bring to Your Next Appointment [x]   Medications: Please bring all your medication bottles to your next appointment to ensure we have an accurate record of your prescriptions. [x]   Health Diaries: If you're monitoring any health conditions at home, keeping a diary of your readings can be very helpful for discussions at your next appointment.  Billing [x]   X-ray & Lab Orders: These are billed by separate companies. Contact the invoicing company directly for questions or concerns. [x]   Visit Charges: Discuss any billing inquiries with our administrative services team.  Your Satisfaction Matters [x]   Share Your Experience: We strive for your satisfaction! If you have any complaints, or preferably compliments, please let Dr. Jesus know directly or contact our Practice Administrators, Manuelita Rubin or [employer], by asking at the front desk.   Reviewing Your Records [x]   Review this early draft of your clinical encounter notes below and the final encounter summary tomorrow on MyChart after its been completed.  All orders placed so far are visible here: Alcohol use disorder -     Disulfiram ; Take 1 tablet (250 mg total) by mouth daily.  Dispense: 90 tablet; Refill: 3 -     AMB Referral VBCI Care Management  Polycythemia -     AMB Referral VBCI Care Management -  CBC with Differential/Platelet -     Comprehensive metabolic panel  Material hardship due to limited financial resources -     AMB Referral VBCI Care Management  Atrial fibrillation with RVR (HCC) -     AMB Referral VBCI Care Management  Cardiac arrest (HCC) -     AMB Referral  VBCI Care Management  Nonischemic cardiomyopathy (HCC) -     AMB Referral VBCI Care Management  Hyperlipidemia, unspecified hyperlipidemia type -     AMB Referral VBCI Care Management  Essential hypertension -     AMB Referral VBCI Care Management  Superficial thrombophlebitis of left upper extremity -     Doxycycline  Hyclate; Take 1 tablet (100 mg total) by mouth 2 (two) times daily.  Dispense: 20 tablet; Refill: 0  Cyst near coccyx

## 2023-05-17 NOTE — Assessment & Plan Note (Signed)
 Polycythemia   High hemoglobin is likely secondary to smoking, which can thicken blood and contribute to cardiac events. Currently using Nicoderm patches. Discussed the importance of smoking cessation and alternative nicotine  replacement options. Order blood work to recheck hemoglobin levels. Continue smoking cessation efforts and discuss alternative nicotine  replacement options if patches are intolerable.

## 2023-05-17 NOTE — Progress Notes (Signed)
 ==============================  [facility]: [phone]   -- Medical Office Visit --  Patient: Warren Ross      Age: 38 y.o.       Sex:  male  Date:   05/15/2023 Today's Healthcare Provider: Bernardino KANDICE Cone, MD  ==============================   CHIEF COMPLAINT: Hospitalization Follow-up [facility] to ED on 12/24.)   SUBJECTIVE: 38 y.o. male who has Tobacco use; Essential hypertension; Alcohol use disorder; Hyperlipidemia; Carpal tunnel syndrome of right wrist; Hypokalemia; Atrial fibrillation with RVR (HCC); Nonischemic cardiomyopathy (HCC); Cyst near coccyx; Superficial thrombophlebitis of left upper extremity; Material hardship due to limited financial resources; and Polycythemia on their problem list.  History of Present Illness The patient, with a history of heavy alcohol consumption and smoking, experienced a sudden cardiac arrest shortly after Christmas. This event led to the placement of a cardiac device. The patient attributes the cardiac arrest to hereditary factors, as his father passed away at the age of 57 from a similar condition. However, the patient acknowledges a history of heavy drinking, consuming at least 18 drinks daily during the week and over 24 drinks on weekends. The patient has since abstained from alcohol and smoking following the cardiac event.  The patient also reports a persistent issue with a cyst in the rectal area, which fills and ruptures approximately every three days. This has been a recurring issue, with previous attempts at drainage proving unsuccessful.  Additionally, the patient has been experiencing discomfort and weakness in the left hand following the placement of an IV in the hospital. The patient describes a sensation of burning that radiates down the arm and a loss of grip strength. The area around the IV site has become hard and tender, although there is no visible redness. The patient reports that these  symptoms have been improving, but the burning sensation and weakness have been worsening.  The patient's medical history also includes a high hemoglobin count, which was identified during a blood draw in early January. This condition, known as polycythemia, could have contributed to the cardiac arrest. The patient was prescribed a blood thinner following the cardiac event. Social History - The patient quit drinking - The patient quit smoking  Past Medical History - Cardiac arrest - Alcohol withdrawal - Kidney injury - Polycythemia  Medications - Eliquis  - Farxiga  - Nicoderm - Toprol  - Lorazepam   Note that patient  has a past medical history of Alcohol use (06/21/2019), Alcohol use disorder (01/16/2017), Atrial fibrillation with RVR (HCC) (04/28/2023), Cardiac arrest (HCC) (04/28/2023), Carpal tunnel syndrome of right wrist (01/26/2019), Carpal tunnel syndrome, left (01/26/2019), Cellulitis (06/21/2019), Cyst near coccyx (05/15/2023), Essential hypertension (01/16/2017), Hyperlipidemia (12/03/2018), Hypertension, Hypokalemia (06/21/2019), Nonischemic cardiomyopathy (HCC) (05/01/2023), Polycythemia (05/15/2023), Right carpal tunnel syndrome (01/26/2019), Superficial thrombophlebitis of left upper extremity (05/15/2023), Tobacco use (01/16/2017), and Viral illness (05/02/2020).  Family History - Father: alcohol-related cardiac arrest at age 28  Problem list overviews that were updated at today's visit: Problem  Cardiac Arrest (Hcc) (Resolved)  Alcohol Use (Resolved)   He reports drinking Coors light about 12 a day not interested in cutting back at this time but I let him know that I am addiction certified and an how lots of helpful approaches if he is interested in cutting back and I explained his alcohol use compared to average alcohol use to help him visualize the issue     Med reconciliation: Current Outpatient Medications on File Prior to Visit  Medication Sig   amiodarone   (PACERONE )  200 MG tablet Take 1 tablet (200 mg total) by mouth daily.   apixaban  (ELIQUIS ) 5 MG TABS tablet Take 1 tablet (5 mg total) by mouth 2 (two) times daily.   dapagliflozin  propanediol (FARXIGA ) 10 MG TABS tablet Take 1 tablet (10 mg total) by mouth daily.   metoprolol  succinate (TOPROL -XL) 100 MG 24 hr tablet Take 1 tablet (100 mg total) by mouth daily.   sacubitril -valsartan  (ENTRESTO ) 49-51 MG Take 1 tablet by mouth 2 (two) times daily.   spironolactone  (ALDACTONE ) 25 MG tablet Take 1 tablet (25 mg total) by mouth daily.   nicotine  (NICODERM CQ  - DOSED IN MG/24 HOURS) 21 mg/24hr patch Place 1 patch (21 mg total) onto the skin daily. (Patient not taking: Reported on 05/15/2023)   No current facility-administered medications on file prior to visit.  There are no discontinued medications.    Objective   Physical Exam     05/15/2023   12:52 PM 05/14/2023   11:48 AM 05/08/2023    2:03 PM  Vitals with BMI  Height 6' 1 6' 1 6' 1  Weight 190 lbs 10 oz 190 lbs 6 oz 190 lbs 3 oz  BMI 25.15 25.13 25.1  Systolic 98 122 130  Diastolic 60 72 96  Pulse 78 61 72   Wt Readings from Last 10 Encounters:  05/15/23 190 lb 9.6 oz (86.5 kg)  05/14/23 190 lb 6.4 oz (86.4 kg)  05/08/23 190 lb 3.2 oz (86.3 kg)  05/02/23 186 lb 11.2 oz (84.7 kg)  01/29/22 184 lb 12.8 oz (83.8 kg)  01/24/21 191 lb (86.6 kg)  07/04/19 191 lb (86.6 kg)  06/28/19 191 lb (86.6 kg)  06/21/19 190 lb (86.2 kg)  01/04/19 191 lb 9.6 oz (86.9 kg)   Vital signs reviewed.  Nursing notes reviewed. Weight trend reviewed. General Appearance:  No acute distress appreciable.   Well-groomed, healthy-appearing male.  Well proportioned with no abnormal fat distribution.  Good muscle tone. Pulmonary:  Normal work of breathing at rest, no respiratory distress apparent. SpO2: 95 %  Musculoskeletal: All extremities are intact.  Neurological:  Awake, alert, oriented, and engaged.  No obvious focal neurological deficits or cognitive  impairments.  Sensorium seems unclouded.   Speech is clear and coherent with logical content. Psychiatric:  Appropriate mood, pleasant and cooperative demeanor, thoughtful and engaged during the exam    Results for orders placed or performed in visit on 05/15/23  CBC with Differential/Platelet  Result Value Ref Range   WBC 7.8 4.0 - 10.5 K/uL   RBC 4.96 4.22 - 5.81 Mil/uL   Hemoglobin 15.7 13.0 - 17.0 g/dL   HCT 53.2 60.9 - 47.9 %   MCV 94.0 78.0 - 100.0 fl   MCHC 33.7 30.0 - 36.0 g/dL   RDW 87.3 88.4 - 84.4 %   Platelets 349.0 150.0 - 400.0 K/uL   Neutrophils Relative % 54.4 43.0 - 77.0 %   Lymphocytes Relative 29.7 12.0 - 46.0 %   Monocytes Relative 12.2 (H) 3.0 - 12.0 %   Eosinophils Relative 2.6 0.0 - 5.0 %   Basophils Relative 1.1 0.0 - 3.0 %   Neutro Abs 4.2 1.4 - 7.7 K/uL   Lymphs Abs 2.3 0.7 - 4.0 K/uL   Monocytes Absolute 0.9 0.1 - 1.0 K/uL   Eosinophils Absolute 0.2 0.0 - 0.7 K/uL   Basophils Absolute 0.1 0.0 - 0.1 K/uL  Comp Met (CMET)  Result Value Ref Range   Sodium 136 135 - 145 mEq/L  Potassium 4.4 3.5 - 5.1 mEq/L   Chloride 99 96 - 112 mEq/L   CO2 30 19 - 32 mEq/L   Glucose, Bld 79 70 - 99 mg/dL   BUN 15 6 - 23 mg/dL   Creatinine, Ser 8.90 0.40 - 1.50 mg/dL   Total Bilirubin 0.3 0.2 - 1.2 mg/dL   Alkaline Phosphatase 101 39 - 117 U/L   AST 24 0 - 37 U/L   ALT 33 0 - 53 U/L   Total Protein 7.7 6.0 - 8.3 g/dL   Albumin 4.6 3.5 - 5.2 g/dL   GFR 14.76 >39.99 mL/min   Calcium 9.8 8.4 - 10.5 mg/dL   Office Visit on 98/89/7974  Component Date Value   WBC 05/15/2023 7.8    RBC 05/15/2023 4.96    Hemoglobin 05/15/2023 15.7    HCT 05/15/2023 46.7    MCV 05/15/2023 94.0    MCHC 05/15/2023 33.7    RDW 05/15/2023 12.6    Platelets 05/15/2023 349.0    Neutrophils Relative % 05/15/2023 54.4    Lymphocytes Relative 05/15/2023 29.7    Monocytes Relative 05/15/2023 12.2 (H)    Eosinophils Relative 05/15/2023 2.6    Basophils Relative 05/15/2023 1.1    Neutro  Abs 05/15/2023 4.2    Lymphs Abs 05/15/2023 2.3    Monocytes Absolute 05/15/2023 0.9    Eosinophils Absolute 05/15/2023 0.2    Basophils Absolute 05/15/2023 0.1    Sodium 05/15/2023 136    Potassium 05/15/2023 4.4    Chloride 05/15/2023 99    CO2 05/15/2023 30    Glucose, Bld 05/15/2023 79    BUN 05/15/2023 15    Creatinine, Ser 05/15/2023 1.09    Total Bilirubin 05/15/2023 0.3    Alkaline Phosphatase 05/15/2023 101    AST 05/15/2023 24    ALT 05/15/2023 33    Total Protein 05/15/2023 7.7    Albumin 05/15/2023 4.6    GFR 05/15/2023 85.23    Calcium 05/15/2023 9.8   Office Visit on 05/14/2023  Component Date Value   Date Time Interrogation * 05/14/2023 [id number]    Pulse Generator Manufact* 05/14/2023 BOST    Pulse Gen Model 05/14/2023 D232 VIGILANT EL ICD    Pulse Gen Serial Number 05/14/2023 [id number]    Clinic Name 05/14/2023 [facility]    Implantable Pulse Genera* 05/14/2023 Implantable Cardiac Defibulator    Implantable Pulse Genera* 05/14/2023 [id number]    Implantable Lead Manufac* 05/14/2023 BOST    Implantable Lead Model 05/14/2023 0673 [employer] 4-Front S    Implantable Lead Serial * 05/14/2023 [id number]    Implantable Lead Implant* 05/14/2023 [id number]    Implantable Lead Locatio* 05/14/2023 UNKNOWN    Implantable Lead Location 05/14/2023 [id number]    Implantable Lead Connect* 05/14/2023 246014    Lead Channel Setting Sen* 05/14/2023 0.6    Lead Channel Setting Sen* 05/14/2023 Adaptive Sensing    Lead Channel Setting Pac* 05/14/2023 0.4    Lead Channel Setting Pac* 05/14/2023 3.5    Zone Setting Status 05/14/2023 Active    Zone Setting Status 05/14/2023 Active    Zone Setting Status 05/14/2023 244988    Lead Channel Impedance V* 05/14/2023 521.0    Lead Channel Sensing Int* 05/14/2023 14.8    Lead Channel Pacing Thre* 05/14/2023 0.6000    Lead Channel Pacing Thre* 05/14/2023 0.4    HighPow Impedance 05/14/2023 85.0    Battery Status 05/14/2023 [facility]  Outpatient Visit on 05/08/2023  Component Date Value   Sodium 05/08/2023 136  Potassium 05/08/2023 4.0    Chloride 05/08/2023 99    CO2 05/08/2023 28    Glucose, Bld 05/08/2023 85    BUN 05/08/2023 12    Creatinine, Ser 05/08/2023 1.25 (H)    Calcium 05/08/2023 9.5    GFR, Estimated 05/08/2023 >60    Anion gap 05/08/2023 9   Admission on 04/28/2023, Discharged on 05/02/2023  Component Date Value   Sodium 04/28/2023 136    Potassium 04/28/2023 3.1 (L)    Chloride 04/28/2023 102    CO2 04/28/2023 18 (L)    Glucose, Bld 04/28/2023 167 (H)    BUN 04/28/2023 7    Creatinine, Ser 04/28/2023 1.12    Calcium 04/28/2023 8.7 (L)    GFR, Estimated 04/28/2023 >60    Anion gap 04/28/2023 16 (H)    Troponin I (High Sensiti* 04/28/2023 12    WBC 04/28/2023 12.3 (H)    RBC 04/28/2023 4.74    Hemoglobin 04/28/2023 15.2    HCT 04/28/2023 45.3    MCV 04/28/2023 95.6    MCH 04/28/2023 32.1    MCHC 04/28/2023 33.6    RDW 04/28/2023 12.2    Platelets 04/28/2023 288    nRBC 04/28/2023 0.0    pH, Ven 04/28/2023 7.401    pCO2, Ven 04/28/2023 29.3 (L)    pO2, Ven 04/28/2023 57 (H)    Bicarbonate 04/28/2023 18.2 (L)    TCO2 04/28/2023 19 (L)    O2 Saturation 04/28/2023 90    Acid-base deficit 04/28/2023 5.0 (H)    Sodium 04/28/2023 135    Potassium 04/28/2023 3.2 (L)    Calcium, Ion 04/28/2023 0.98 (L)    HCT 04/28/2023 46.0    Hemoglobin 04/28/2023 15.6    Sample type 04/28/2023 VENOUS    Opiates 04/28/2023 NONE DETECTED    Cocaine 04/28/2023 NONE DETECTED    Benzodiazepines 04/28/2023 NONE DETECTED    Amphetamines 04/28/2023 NONE DETECTED    Tetrahydrocannabinol 04/28/2023 NONE DETECTED    Barbiturates 04/28/2023 NONE DETECTED    Alcohol, Ethyl (B) 04/28/2023 41 (H)    Lactic Acid, Venous 04/28/2023 5.7 (HH)    Comment 04/28/2023 NOTIFIED PHYSICIAN    Magnesium  04/28/2023 1.8    Troponin I (High Sensiti* 04/28/2023 217 (HH)    Lactic Acid, Venous 04/28/2023 1.4    HIV Screen 4th  Generatio* 04/28/2023 Non Reactive    Weight 04/29/2023 3,004.8    Height 04/29/2023 73    BP 04/29/2023 142/91    S' Lateral 04/29/2023 4.30    AR max vel 04/29/2023 3.17    AV Area VTI 04/29/2023 3.12    AV Mean grad 04/29/2023 1.8    AV Peak grad 04/29/2023 3.5    Ao pk vel 04/29/2023 0.93    Area-P 1/2 04/29/2023 6.07    AV Area mean vel 04/29/2023 3.14    Est EF 04/29/2023 25 - 30%    Sodium 04/29/2023 135    Potassium 04/29/2023 3.8    Chloride 04/29/2023 103    CO2 04/29/2023 22    Glucose, Bld 04/29/2023 116 (H)    BUN 04/29/2023 6    Creatinine, Ser 04/29/2023 0.86    Calcium 04/29/2023 9.0    Total Protein 04/29/2023 6.7    Albumin 04/29/2023 3.9    AST 04/29/2023 71 (H)    ALT 04/29/2023 33    Alkaline Phosphatase 04/29/2023 65    Total Bilirubin 04/29/2023 0.9    GFR, Estimated 04/29/2023 >60    Anion gap 04/29/2023 10    WBC 04/29/2023  10.6 (H)    RBC 04/29/2023 4.95    Hemoglobin 04/29/2023 15.6    HCT 04/29/2023 45.5    MCV 04/29/2023 91.9    MCH 04/29/2023 31.5    MCHC 04/29/2023 34.3    RDW 04/29/2023 12.4    Platelets 04/29/2023 279    nRBC 04/29/2023 0.0    Phosphorus 04/28/2023 2.4 (L)    Heparin  Unfractionated 04/29/2023 <0.10 (L)    Heparin  Unfractionated 04/29/2023 0.24 (L)    Vitamin B-12 04/29/2023 181    Vitamin B1 (Thiamine ) 04/29/2023 208.2 (H)    Folate 04/29/2023 10.7    Vitamin B6 04/29/2023 13.4    Heparin  Unfractionated 04/29/2023 0.31    Heparin  Unfractionated 04/30/2023 0.36    WBC 04/30/2023 10.2    RBC 04/30/2023 5.35    Hemoglobin 04/30/2023 17.2 (H)    HCT 04/30/2023 49.3    MCV 04/30/2023 92.1    MCH 04/30/2023 32.1    MCHC 04/30/2023 34.9    RDW 04/30/2023 12.4    Platelets 04/30/2023 248    nRBC 04/30/2023 0.0    Sodium 04/30/2023 136    Potassium 04/30/2023 3.8    Chloride 04/30/2023 105    CO2 04/30/2023 20 (L)    Glucose, Bld 04/30/2023 96    BUN 04/30/2023 7    Creatinine, Ser 04/30/2023 0.91    Calcium  04/30/2023 9.1    Total Protein 04/30/2023 6.6    Albumin 04/30/2023 3.7    AST 04/30/2023 69 (H)    ALT 04/30/2023 34    Alkaline Phosphatase 04/30/2023 60    Total Bilirubin 04/30/2023 0.6    GFR, Estimated 04/30/2023 >60    Anion gap 04/30/2023 11    Magnesium  04/30/2023 1.7    Phosphorus 04/30/2023 3.1    Hgb A1c MFr Bld 04/30/2023 5.4    Mean Plasma Glucose 04/30/2023 108    Cholesterol 04/30/2023 149    Triglycerides 04/30/2023 91    HDL 04/30/2023 60    Total CHOL/HDL Ratio 04/30/2023 2.5    VLDL 04/30/2023 18    LDL Cholesterol 04/30/2023 71    Heparin  Unfractionated 04/30/2023 <0.10 (L)    Heparin  Unfractionated 05/01/2023 <0.10 (L)    WBC 05/01/2023 8.7    RBC 05/01/2023 5.19    Hemoglobin 05/01/2023 16.5    HCT 05/01/2023 47.9    MCV 05/01/2023 92.3    MCH 05/01/2023 31.8    MCHC 05/01/2023 34.4    RDW 05/01/2023 12.2    Platelets 05/01/2023 261    nRBC 05/01/2023 0.0    Lipoprotein (a) 05/01/2023 197.0 (H)    Sodium 05/01/2023 137    Potassium 05/01/2023 3.4 (L)    Chloride 05/01/2023 105    CO2 05/01/2023 23    Glucose, Bld 05/01/2023 100 (H)    BUN 05/01/2023 <5 (L)    Creatinine, Ser 05/01/2023 0.90    Calcium 05/01/2023 8.8 (L)    GFR, Estimated 05/01/2023 >60    Anion gap 05/01/2023 9    Magnesium  05/01/2023 1.9    TSH 05/01/2023 3.343    MRSA, PCR 05/01/2023 NEGATIVE    Staphylococcus aureus 05/01/2023 NEGATIVE    WBC 05/02/2023 9.3    RBC 05/02/2023 5.39    Hemoglobin 05/02/2023 17.1 (H)    HCT 05/02/2023 49.9    MCV 05/02/2023 92.6    MCH 05/02/2023 31.7    MCHC 05/02/2023 34.3    RDW 05/02/2023 12.2    Platelets 05/02/2023 235    nRBC 05/02/2023 0.0    Sodium  05/02/2023 136    Potassium 05/02/2023 3.9    Chloride 05/02/2023 105    CO2 05/02/2023 21 (L)    Glucose, Bld 05/02/2023 93    BUN 05/02/2023 7    Creatinine, Ser 05/02/2023 0.99    Calcium 05/02/2023 8.9    GFR, Estimated 05/02/2023 >60    Anion gap 05/02/2023 10     Magnesium  05/02/2023 2.0   No image results found. CUP PACEART INCLINIC DEVICE CHECK Result Date: 05/14/2023 Normal ICD wound check. Wound well healed. Thresholds, sensing, and impedances consistent with implant measurements with 3.5V safety margin/auto capture until 3 month visit. No episodes.  Reviewed arm restrictions to continue for 6 weeks total post op. Reviewed shock plan.  Pt enrolled in remote follow-up.  DG Chest 2 View Result Date: 05/02/2023 CLINICAL DATA:  Pacemaker placement. EXAM: CHEST - 2 VIEW COMPARISON:  May 01, 2023. FINDINGS: The heart size and mediastinal contours are within normal limits. Single lead left-sided defibrillator is in grossly good position. Both lungs are clear. The visualized skeletal structures are unremarkable. IMPRESSION: No active cardiopulmonary disease. Electronically Signed   By: Lynwood Landy Raddle M.D.   On: 05/02/2023 08:35   EP PPM/ICD IMPLANT Addendum Date: 05/01/2023  CONCLUSIONS:  1. Successful single chamber ICD implantation for secondary prevention of sudden death.  2. No early apparent complications.  3. Resume oral anticoagulation in 72 hours. Fonda Kitty, MD Cardiac Electrophysiology  Result Date: 05/01/2023  CONCLUSIONS:  1. Successful single chamber ICD implantation for secondary prevention of sudden death.  2. No early apparent complications. Fonda Kitty, MD Cardiac Electrophysiology   DG Chest Port 1 View Result Date: 05/01/2023 CLINICAL DATA:  Status post defibrillator placement EXAM: PORTABLE CHEST 1 VIEW COMPARISON:  04/28/2023 FINDINGS: Cardiac shadow is stable. Defibrillator is now seen. No pneumothorax is noted. The lungs are clear. No bony abnormality is noted. IMPRESSION: No pneumothorax following defibrillator placement Electronically Signed   By: Oneil Devonshire M.D.   On: 05/01/2023 19:09   MR CARDIAC MORPHOLOGY W WO CONTRAST Result Date: 05/01/2023 CLINICAL DATA:  Clinical question of cardiomyopathy Study assumes BSA of  2.09 m2. EXAM: CARDIAC MRI TECHNIQUE: The patient was scanned on a 1.5 Tesla GE magnet. A dedicated cardiac coil was used. Functional imaging was done using Fiesta sequences. 2,3, and 4 chamber views were done to assess for RWMA's. Modified Simpson's rule using a short axis stack was used to calculate an ejection fraction on a dedicated work [employer]. The patient received 10 cc of Gadavist . After 10 minutes inversion recovery sequences were used to assess for infiltration and scar tissue. Flow quantification was performed 2 times during this examination with flow quantification performed at the levels of the ascending aorta above the valve, pulmonary artery above the valve. CONTRAST:  10 cc  of Gadavist  FINDINGS: 1. Normal left ventricular size, with LVEDD 55 mm, and LVEDVi 91 mL/m2. Normal left ventricular thickness. Severely decreased left ventricular systolic function (LVEF =28%). Relative septal hypokinesis. Left ventricular parametric mapping notable for normal T2. Suboptimal native T1 acquisition precludes accurate ECV analysis. Normal Post contrast T1. There is no late gadolinium enhancement in the left ventricular myocardium. 2. Normal right ventricular size with RVEDVI 79 mL/m2. Normal right ventricular thickness. Moderately decreased right ventricular systolic function (RVEF =37%). Relative septal hypokinesis. 3.  Normal left.  Mild right atrial enlargement. 4. Normal size of the aortic root, ascending aorta and pulmonary artery. 5. Valve assessment: Aortic Valve: Tri-leaflet aortic valve, no significant AI.  Pulmonic Valve: Mild PI, regurgitant fraction 8%. Tricuspid Valve: No significant regurgitation. Mitral Valve: Mild regurgitation, regurgitant fraction 8%. 6.  Normal pericardium.  No pericardial effusion. 7. Grossly, no extracardiac findings. Recommended dedicated study if concerned for non-cardiac pathology. 8. Breath hold artifacts noted. Study done free-breathing. This limits  accuracy of flow quantification. IMPRESSION: 1.  Severe decrease in LVEF, 28%. 2.  Moderate decrease in RVEF, 37%. 3.  No LGE. Stanly Leavens MD Electronically Signed   By: Stanly Leavens M.D.   On: 05/01/2023 14:38   MR CARDIAC VELOCITY FLOW MAP Result Date: 05/01/2023 CLINICAL DATA:  Clinical question of cardiomyopathy Study assumes BSA of 2.09 m2. EXAM: CARDIAC MRI TECHNIQUE: The patient was scanned on a 1.5 Tesla GE magnet. A dedicated cardiac coil was used. Functional imaging was done using Fiesta sequences. 2,3, and 4 chamber views were done to assess for RWMA's. Modified Simpson's rule using a short axis stack was used to calculate an ejection fraction on a dedicated work [employer]. The patient received 10 cc of Gadavist . After 10 minutes inversion recovery sequences were used to assess for infiltration and scar tissue. Flow quantification was performed 2 times during this examination with flow quantification performed at the levels of the ascending aorta above the valve, pulmonary artery above the valve. CONTRAST:  10 cc  of Gadavist  FINDINGS: 1. Normal left ventricular size, with LVEDD 55 mm, and LVEDVi 91 mL/m2. Normal left ventricular thickness. Severely decreased left ventricular systolic function (LVEF =28%). Relative septal hypokinesis. Left ventricular parametric mapping notable for normal T2. Suboptimal native T1 acquisition precludes accurate ECV analysis. Normal Post contrast T1. There is no late gadolinium enhancement in the left ventricular myocardium. 2. Normal right ventricular size with RVEDVI 79 mL/m2. Normal right ventricular thickness. Moderately decreased right ventricular systolic function (RVEF =37%). Relative septal hypokinesis. 3.  Normal left.  Mild right atrial enlargement. 4. Normal size of the aortic root, ascending aorta and pulmonary artery. 5. Valve assessment: Aortic Valve: Tri-leaflet aortic valve, no significant AI. Pulmonic Valve: Mild  PI, regurgitant fraction 8%. Tricuspid Valve: No significant regurgitation. Mitral Valve: Mild regurgitation, regurgitant fraction 8%. 6.  Normal pericardium.  No pericardial effusion. 7. Grossly, no extracardiac findings. Recommended dedicated study if concerned for non-cardiac pathology. 8. Breath hold artifacts noted. Study done free-breathing. This limits accuracy of flow quantification. IMPRESSION: 1.  Severe decrease in LVEF, 28%. 2.  Moderate decrease in RVEF, 37%. 3.  No LGE. Stanly Leavens MD Electronically Signed   By: Stanly Leavens M.D.   On: 05/01/2023 14:38   MR CARDIAC VELOCITY FLOW MAP Result Date: 05/01/2023 CLINICAL DATA:  Clinical question of cardiomyopathy Study assumes BSA of 2.09 m2. EXAM: CARDIAC MRI TECHNIQUE: The patient was scanned on a 1.5 Tesla GE magnet. A dedicated cardiac coil was used. Functional imaging was done using Fiesta sequences. 2,3, and 4 chamber views were done to assess for RWMA's. Modified Simpson's rule using a short axis stack was used to calculate an ejection fraction on a dedicated work [employer]. The patient received 10 cc of Gadavist . After 10 minutes inversion recovery sequences were used to assess for infiltration and scar tissue. Flow quantification was performed 2 times during this examination with flow quantification performed at the levels of the ascending aorta above the valve, pulmonary artery above the valve. CONTRAST:  10 cc  of Gadavist  FINDINGS: 1. Normal left ventricular size, with LVEDD 55 mm, and LVEDVi 91 mL/m2. Normal left ventricular thickness.  Severely decreased left ventricular systolic function (LVEF =28%). Relative septal hypokinesis. Left ventricular parametric mapping notable for normal T2. Suboptimal native T1 acquisition precludes accurate ECV analysis. Normal Post contrast T1. There is no late gadolinium enhancement in the left ventricular myocardium. 2. Normal right ventricular size with RVEDVI 79  mL/m2. Normal right ventricular thickness. Moderately decreased right ventricular systolic function (RVEF =37%). Relative septal hypokinesis. 3.  Normal left.  Mild right atrial enlargement. 4. Normal size of the aortic root, ascending aorta and pulmonary artery. 5. Valve assessment: Aortic Valve: Tri-leaflet aortic valve, no significant AI. Pulmonic Valve: Mild PI, regurgitant fraction 8%. Tricuspid Valve: No significant regurgitation. Mitral Valve: Mild regurgitation, regurgitant fraction 8%. 6.  Normal pericardium.  No pericardial effusion. 7. Grossly, no extracardiac findings. Recommended dedicated study if concerned for non-cardiac pathology. 8. Breath hold artifacts noted. Study done free-breathing. This limits accuracy of flow quantification. IMPRESSION: 1.  Severe decrease in LVEF, 28%. 2.  Moderate decrease in RVEF, 37%. 3.  No LGE. Stanly Leavens MD Electronically Signed   By: Stanly Leavens M.D.   On: 05/01/2023 14:38   DG Eye Foreign Body Result Date: 04/30/2023 CLINICAL DATA:  Metal working/exposure; clearance prior to MRI EXAM: ORBITS FOR FOREIGN BODY - 2 VIEW COMPARISON:  None Available. FINDINGS: There is no evidence of metallic foreign body within the orbits. No significant bone abnormality identified. IMPRESSION: No evidence of metallic foreign body within the orbits. Electronically Signed   By: Oneil Devonshire M.D.   On: 04/30/2023 16:45   CARDIAC CATHETERIZATION Result Date: 04/30/2023 Images from the original result were not included. Left Heart Catheterization 04/30/23: Hemodynamic data: LV: 97/-3, EDP 6 mmHg.  LA pressure 7 mmHg. Aorta 99/88, mean 92 mmHg.  There is no pressure gradient across the aortic valve. Angiographic data: LV: Global hypokinesis.  Ejection fraction around 35%.  Patient in atrial fibrillation hence accurate assessment of LVEF was difficult. LM: Large-caliber vessel, smooth and normal. LAD: Smooth and normal, gives origin to small size D1 and a  moderate-sized D2. LCx: Moderate caliber vessel giving origin to a moderate-sized OM1 is a continuation and small sized AV groove branch.  It is smooth and normal. RI: Small vessel, smooth and normal. Impression and recommendations: Findings consistent with nonischemic cardiomyopathy.  Optimization of medical therapy, I have started him on Entresto  low-dose and 12.5 mg of spironolactone .  Rate control for atrial fibrillation with metoprolol  tartrate presently on 50 mg twice daily.   EEG adult Result Date: 04/29/2023 Shelton Arlin KIDD, MD     04/29/2023  1:47 PM Patient Name: KURK CORNIEL MRN: [id number] Epilepsy Attending: Arlin KIDD Shelton Referring Physician/Provider: Seena Marsa NOVAK, MD Date: 04/29/2023 Duration: 22.43 mins Patient history: 38yo M s/p cardiac arrest getting eeg to evaluate for seizure Level of alertness: Awake AEDs during EEG study: Librium  Technical aspects: This EEG study was done with scalp electrodes positioned according to the 10-20 International system of electrode placement. Electrical activity was reviewed with band pass filter of 1-70Hz , sensitivity of 7 uV/mm, display speed of 56mm/sec with a 60Hz  notched filter applied as appropriate. EEG data were recorded continuously and digitally stored.  Video monitoring was available and reviewed as appropriate. Description: The posterior dominant rhythm consists of 8 Hz activity of moderate voltage (25-35 uV) seen predominantly in posterior head regions, symmetric and reactive to eye opening and eye closing.  There is an excessive amount of 15 to 18 Hz beta activity distributed symmetrically and diffusely. Physiologic photic driving was seen  during photic stimulation.  Hyperventilation was not performed.   ABNORMALITY - Excessive beta, generalized IMPRESSION: This study is within normal limits. The excessive beta activity seen in the background is most likely due to the effect of benzodiazepine and is a benign EEG pattern. No seizures  or epileptiform discharges were seen throughout the recording. A normal interictal EEG does not exclude the diagnosis of epilepsy. Arlin MALVA Krebs   ECHOCARDIOGRAM COMPLETE Result Date: 04/29/2023    ECHOCARDIOGRAM REPORT   Patient Name:   JAREB RADONCIC Date of Exam: 04/29/2023 Medical Rec #:  [id number]         Height:       73.0 in Accession #:    [id number]        Weight:       187.8 lb Date of Birth:  1986-04-06         BSA:          2.095 m Patient Age:    37 years          BP:           142/91 mmHg Patient Gender: M                 HR:           120 bpm. Exam Location:  Inpatient Procedure: 2D Echo, 3D Echo, Cardiac Doppler and Color Doppler Indications:    Cardiac arrest  History:        Patient has no prior history of Echocardiogram examinations.                 ETOH abuse, Arrythmias:Atrial Fibrillation; Risk                 Factors:Hypertension, Dyslipidemia and Current Smoker.  Sonographer:    Tillman Nora RVT RCS Referring Phys: MARSA KATHEE SCURRY  Sonographer Comments: HR from 120 bpm to 130bpm IMPRESSIONS  1. Left ventricular ejection fraction, by estimation, is 25 to 30%. The left ventricle has severely decreased function. The left ventricle demonstrates global hypokinesis. Indeterminate diastolic filling due to E-A fusion.  2. Right ventricular systolic function is mildly reduced. The right ventricular size is normal.  3. The mitral valve is normal in structure. Trivial mitral valve regurgitation. No evidence of mitral stenosis.  4. The aortic valve is normal in structure. Aortic valve regurgitation is not visualized. No aortic stenosis is present.  5. The inferior vena cava is dilated in size with >50% respiratory variability, suggesting right atrial pressure of 8 mmHg. FINDINGS  Left Ventricle: Left ventricular ejection fraction, by estimation, is 25 to 30%. The left ventricle has severely decreased function. The left ventricle demonstrates global hypokinesis. The left ventricular internal  cavity size was normal in size. There is no left ventricular hypertrophy. Indeterminate diastolic filling due to E-A fusion. Right Ventricle: The right ventricular size is normal. No increase in right ventricular wall thickness. Right ventricular systolic function is mildly reduced. Left Atrium: Left atrial size was normal in size. Right Atrium: Right atrial size was normal in size. Pericardium: There is no evidence of pericardial effusion. Mitral Valve: The mitral valve is normal in structure. Trivial mitral valve regurgitation. No evidence of mitral valve stenosis. Tricuspid Valve: The tricuspid valve is normal in structure. Tricuspid valve regurgitation is trivial. No evidence of tricuspid stenosis. Aortic Valve: The aortic valve is normal in structure. Aortic valve regurgitation is not visualized. No aortic stenosis is present. Aortic valve mean gradient measures 1.8 mmHg. Aortic valve  peak gradient measures 3.5 mmHg. Aortic valve area, by VTI measures 3.12 cm. Pulmonic Valve: The pulmonic valve was normal in structure. Pulmonic valve regurgitation is not visualized. No evidence of pulmonic stenosis. Aorta: The aortic root is normal in size and structure. Venous: The inferior vena cava is dilated in size with greater than 50% respiratory variability, suggesting right atrial pressure of 8 mmHg. IAS/Shunts: No atrial level shunt detected by color flow Doppler.  LEFT VENTRICLE PLAX 2D LVIDd:         5.10 cm   Diastology LVIDs:         4.30 cm   LV e' medial:    8.59 cm/s LV PW:         1.00 cm   LV E/e' medial:  6.5 LV IVS:        1.00 cm   LV e' lateral:   17.50 cm/s LVOT diam:     2.20 cm   LV E/e' lateral: 3.2 LV SV:         36 LV SV Index:   17 LVOT Area:     3.80 cm                           3D Volume EF:                          3D EF:        31 %                          LV EDV:       149 ml                          LV ESV:       103 ml                          LV SV:        47 ml RIGHT VENTRICLE RV S  prime:     9.14 cm/s TAPSE (M-mode): 2.1 cm LEFT ATRIUM             Index        RIGHT ATRIUM           Index LA diam:        3.30 cm 1.58 cm/m   RA Area:     14.90 cm LA Vol (A2C):   48.0 ml 22.91 ml/m  RA Volume:   40.80 ml  19.48 ml/m LA Vol (A4C):   35.8 ml 17.09 ml/m LA Biplane Vol: 43.0 ml 20.53 ml/m  AORTIC VALVE                    PULMONIC VALVE AV Area (Vmax):    3.17 cm     PV Vmax:       0.77 m/s AV Area (Vmean):   3.14 cm     PV Peak grad:  2.4 mmHg AV Area (VTI):     3.12 cm AV Vmax:           92.94 cm/s AV Vmean:          61.300 cm/s AV VTI:            0.114 m AV Peak Grad:      3.5 mmHg AV Mean Grad:  1.8 mmHg LVOT Vmax:         77.40 cm/s LVOT Vmean:        50.633 cm/s LVOT VTI:          0.094 m LVOT/AV VTI ratio: 0.82  AORTA Ao Root diam: 3.00 cm Ao Asc diam:  3.20 cm MITRAL VALVE MV Area (PHT): 6.07 cm    SHUNTS MV Decel Time: 125 msec    Systemic VTI:  0.09 m MV E velocity: 56.10 cm/s  Systemic Diam: 2.20 cm MV A velocity: 47.70 cm/s MV E/A ratio:  1.18 Toribio Fuel MD Electronically signed by Toribio Fuel MD Signature Date/Time: 04/29/2023/10:18:46 AM    Final    DG Chest Portable 1 View Result Date: 04/28/2023 CLINICAL DATA:  Seizure-like activity followed by an episode of unresponsiveness. Atrial fibrillation. CPR. EXAM: PORTABLE CHEST 1 VIEW COMPARISON:  None Available. FINDINGS: Heart size and pulmonary vascularity are normal. Lungs are clear. No pleural effusions. No pneumothorax. Mediastinal contours appear intact. IMPRESSION: No active disease. Electronically Signed   By: Elsie Gravely M.D.   On: 04/28/2023 17:16  No results found.     Assessment & Plan Alcohol use disorder Alcohol Use Disorder   Previously consumed 18 beers daily on weekdays and over 24 on weekends. He has abstained since the cardiac event and reports no current cravings, though he acknowledges potential cravings under stress. Discussed genetic predisposition and the importance of  maintaining sobriety. Informed about lorazepam  and Antabuse  for cravings and relapse prevention. The effectiveness of 12-step programs and support groups was also discussed. Prescribe Antabuse  and encourage participation in 12-step programs or support groups. Discuss the importance of vitamins B1, B6, and B12, and recommend fortified breakfast cereals for these vitamins. Polycythemia Polycythemia   High hemoglobin is likely secondary to smoking, which can thicken blood and contribute to cardiac events. Currently using Nicoderm patches. Discussed the importance of smoking cessation and alternative nicotine  replacement options. Order blood work to recheck hemoglobin levels. Continue smoking cessation efforts and discuss alternative nicotine  replacement options if patches are intolerable. Material hardship due to limited financial resources  Atrial fibrillation with RVR (HCC)  Cardiac arrest (HCC)  Nonischemic cardiomyopathy (HCC) Non-Ischemic Cardiomyopathy   This condition is likely secondary to alcohol use disorder, with a family history of similar events. After experiencing cardiac arrest on April 30, 2023, a device was implanted the following day. He has abstained from alcohol since the event. The risks of relapse and the importance of maintaining sobriety were discussed, along with the role of vitamins B1, B6, and B12 in heart health. Continue Eliquis , Farxiga , Nicoderm, and Toprol . Prescribe Antabuse . Refer to a child psychotherapist and pharmacist to reduce medication costs. Order blood work for kidney function and hemoglobin levels. Hyperlipidemia, unspecified hyperlipidemia type  Essential hypertension  Superficial thrombophlebitis of left upper extremity Superficial Thrombophlebitis of the Left Upper Extremity   Experiencing pain, burning, and weakness in the left arm post-IV insertion, suggesting possible infection or blood clot. Currently on a blood thinner with some improvement. Discussed  potential MRSA infection and the need for antibiotics. Informed about the benefits of warm compresses. Prescribe doxycycline  for potential MRSA infection and advise warm compresses. Monitor for worsening symptoms and advise hospital return if no improvement. Cyst near coccyx Perianal Cyst   Recurring cyst in the perianal area fills and pops every three days. Previous drainage attempts were unsuccessful. Discussed the need for surgical evaluation and potential removal. Refer to a rectal surgeon for evaluation and potential surgical removal.  Orders Placed During this Encounter:   Orders Placed This Encounter  Procedures   CBC with Differential/Platelet   Comp Met (CMET)   AMB Referral VBCI Care Management    Referral Priority:   Routine    Referral Type:   Consultation    Referral Reason:   Care Coordination    Number of Visits Requested:   1   Meds ordered this encounter  Medications   disulfiram  (ANTABUSE ) 250 MG tablet    Sig: Take 1 tablet (250 mg total) by mouth daily.    Dispense:  90 tablet    Refill:  3   doxycycline  (VIBRA -TABS) 100 MG tablet    Sig: Take 1 tablet (100 mg total) by mouth 2 (two) times daily.    Dispense:  20 tablet    Refill:  0    General Health Maintenance   Managing multiple health conditions under significant stress, with a supportive partner and expecting a new baby. Discussed the importance of stress management techniques and maintaining a healthy lifestyle. Encourage stress management techniques such as walking and discuss the importance of maintaining a healthy lifestyle. Ensure regular follow-up appointments with a cardiologist and other specialists.  Follow-up   Schedule follow-up appointments with a cardiologist and heart failure clinic. Ensure a pharmacist appointment on May 19, 2023. Monitor for new or worsening symptoms and adjust treatment plans accordingly.    This document was synthesized by artificial intelligence (Abridge) using  HIPAA-compliant recording of the clinical interaction;   We discussed the use of AI scribe software for clinical note transcription with the patient, who gave verbal consent to proceed.    Additional Info: This encounter employed state-of-the-art, real-time, collaborative documentation. The patient actively reviewed and assisted in updating their electronic medical record on a shared screen, ensuring transparency and facilitating joint problem-solving for the problem list, overview, and plan. This approach promotes accurate, informed care. The treatment plan was discussed and reviewed in detail, including medication safety, potential side effects, and all patient questions. We confirmed understanding and comfort with the plan. Follow-up instructions were established, including contacting the office for any concerns, returning if symptoms worsen, persist, or new symptoms develop, and precautions for potential emergency department visits.

## 2023-05-17 NOTE — Assessment & Plan Note (Signed)
 Perianal Cyst   Recurring cyst in the perianal area fills and pops every three days. Previous drainage attempts were unsuccessful. Discussed the need for surgical evaluation and potential removal. Refer to a rectal surgeon for evaluation and potential surgical removal.

## 2023-05-17 NOTE — Assessment & Plan Note (Signed)
 Superficial Thrombophlebitis of the Left Upper Extremity   Experiencing pain, burning, and weakness in the left arm post-IV insertion, suggesting possible infection or blood clot. Currently on a blood thinner with some improvement. Discussed potential MRSA infection and the need for antibiotics. Informed about the benefits of warm compresses. Prescribe doxycycline  for potential MRSA infection and advise warm compresses. Monitor for worsening symptoms and advise hospital return if no improvement.

## 2023-05-18 ENCOUNTER — Telehealth: Payer: Self-pay | Admitting: *Deleted

## 2023-05-18 NOTE — Progress Notes (Signed)
 Complex Care Management Note  Care Guide Note 05/18/2023 Name: Warren Ross MRN: [id number] DOB: 03-07-1986  [name] is a 38 y.o. year old male who sees Jesus Bernardino MATSU, MD for primary care. I reached out to [employer] by phone today to offer complex care management services.  [name] was given information about Complex Care Management services today including:   The Complex Care Management services include support from the care team which includes your Nurse Coordinator, Clinical Social Worker, or Pharmacist.  The Complex Care Management team is here to help remove barriers to the health concerns and goals most important to you. Complex Care Management services are voluntary, and the patient may decline or stop services at any time by request to their care team member.   Complex Care Management Consent Status: Patient did not agree to participate in complex care management services at this time.  Follow up plan:  pt appreciative declines need for social work services and also has appt tomorrow with pharmacy   Encounter Outcome:  Patient Refused  Thedford Franks, [facility] Care Coordination Care Guide Direct Dial: [phone]

## 2023-05-19 ENCOUNTER — Other Ambulatory Visit (HOSPITAL_COMMUNITY): Payer: Self-pay

## 2023-05-19 ENCOUNTER — Ambulatory Visit (HOSPITAL_COMMUNITY)
Admission: RE | Admit: 2023-05-19 | Discharge: 2023-05-19 | Disposition: A | Discharge status: Home / Self Care | Payer: BC Managed Care – PPO | Source: Ambulatory Visit | Attending: Cardiology | Admitting: Cardiology

## 2023-05-19 VITALS — BP 108/66 | HR 71 | Wt 192.6 lb

## 2023-05-19 DIAGNOSIS — Z8249 Family history of ischemic heart disease and other diseases of the circulatory system: Secondary | ICD-10-CM | POA: Insufficient documentation

## 2023-05-19 DIAGNOSIS — Z79899 Other long term (current) drug therapy: Secondary | ICD-10-CM | POA: Diagnosis not present

## 2023-05-19 DIAGNOSIS — I808 Phlebitis and thrombophlebitis of other sites: Secondary | ICD-10-CM | POA: Insufficient documentation

## 2023-05-19 DIAGNOSIS — Z7901 Long term (current) use of anticoagulants: Secondary | ICD-10-CM | POA: Insufficient documentation

## 2023-05-19 DIAGNOSIS — Z87891 Personal history of nicotine dependence: Secondary | ICD-10-CM | POA: Diagnosis not present

## 2023-05-19 DIAGNOSIS — I5082 Biventricular heart failure: Secondary | ICD-10-CM | POA: Insufficient documentation

## 2023-05-19 DIAGNOSIS — I428 Other cardiomyopathies: Secondary | ICD-10-CM

## 2023-05-19 DIAGNOSIS — I480 Paroxysmal atrial fibrillation: Secondary | ICD-10-CM | POA: Insufficient documentation

## 2023-05-19 DIAGNOSIS — Z8674 Personal history of sudden cardiac arrest: Secondary | ICD-10-CM | POA: Insufficient documentation

## 2023-05-19 DIAGNOSIS — I110 Hypertensive heart disease with heart failure: Secondary | ICD-10-CM | POA: Diagnosis not present

## 2023-05-19 DIAGNOSIS — F1011 Alcohol abuse, in remission: Secondary | ICD-10-CM | POA: Insufficient documentation

## 2023-05-19 DIAGNOSIS — Z5986 Financial insecurity: Secondary | ICD-10-CM | POA: Diagnosis not present

## 2023-05-19 DIAGNOSIS — Z95810 Presence of automatic (implantable) cardiac defibrillator: Secondary | ICD-10-CM | POA: Insufficient documentation

## 2023-05-19 MED ORDER — APIXABAN 5 MG PO TABS: 5.0000 mg | ORAL_TABLET | Freq: Two times a day (BID) | ORAL | 3 refills | Status: DC

## 2023-05-19 MED ORDER — EMPAGLIFLOZIN 10 MG PO TABS: 10.0000 mg | ORAL_TABLET | Freq: Every day | ORAL | 11 refills | Status: DC

## 2023-05-19 MED ORDER — METOPROLOL SUCCINATE ER 100 MG PO TB24: 100.0000 mg | ORAL_TABLET | Freq: Every day | ORAL | 5 refills | Status: DC

## 2023-05-19 NOTE — Patient Instructions (Signed)
 It was a pleasure seeing you today!  MEDICATIONS: -We are changing your medications today -Stop Farxiga  (once you finish your current supply) then start Jardiance  10 mg (1 tablet) daily. We will need to apply for patient assistance as this medication is still expensive even with a copay card. Please find your and your wife W2 (or tax returns) and provide to the clinic so we can submit on your behalf. Be on the lookout for a call from Stef, she is the clinic patient advocate and will be helping you apply.  - I recommend you ask for a 90 day fill of Entresto  at your next visit. The copay will be $10 with the copay card regardless if you fill a 30 or 90 day of the Entresto  (this is not true for other medications). -Call if you have questions about your medications.   NEXT APPOINTMENT: Return to clinic in 2 weeks with Dr. Rolan.  In general, to take care of your heart failure: -Limit your fluid intake to 2 Liters (half-gallon) per day.   -Limit your salt intake to ideally 2-3 grams (2000-3000 mg) per day. -Weigh yourself daily and record, and bring that weight diary to your next appointment.  (Weight gain of 2-3 pounds in 1 day typically means fluid weight.) -The medications for your heart are to help your heart and help you live longer.   -Please contact us  before stopping any of your heart medications.  Call the clinic at [phone] with questions or to reschedule future appointments.

## 2023-05-29 ENCOUNTER — Telehealth (HOSPITAL_COMMUNITY): Payer: Self-pay

## 2023-05-29 NOTE — Telephone Encounter (Signed)
Advanced Heart Failure Patient Advocate Encounter  Application for Jardiance electronically faxed to [facility] on 05/29/2023. Application form attached to patient chart.  Burnell Blanks, CPhT Rx Patient Advocate Phone: [phone]

## 2023-06-01 ENCOUNTER — Encounter (HOSPITAL_COMMUNITY): Payer: Self-pay | Admitting: Cardiology

## 2023-06-02 ENCOUNTER — Other Ambulatory Visit (HOSPITAL_COMMUNITY): Payer: Self-pay

## 2023-06-02 NOTE — Telephone Encounter (Signed)
Received initial denial letter from [facility], as patient currently has commercial coverage. I have faxed a copy of the current copay costs to [facility] for review. Will follow up.

## 2023-06-04 NOTE — Telephone Encounter (Signed)
Contacted [employer] to check status of application. They have not yet processed the proof of copay and are still dealing with a backlog of applications. Will continue to follow up.

## 2023-06-05 ENCOUNTER — Ambulatory Visit (HOSPITAL_COMMUNITY)
Admission: RE | Admit: 2023-06-05 | Discharge: 2023-06-05 | Disposition: A | Discharge status: Home / Self Care | Payer: BC Managed Care – PPO | Source: Ambulatory Visit | Attending: Cardiology | Admitting: Cardiology

## 2023-06-05 ENCOUNTER — Encounter (HOSPITAL_COMMUNITY): Payer: Self-pay | Admitting: Cardiology

## 2023-06-05 VITALS — BP 110/70 | HR 67 | Wt 199.0 lb

## 2023-06-05 DIAGNOSIS — R0602 Shortness of breath: Secondary | ICD-10-CM | POA: Insufficient documentation

## 2023-06-05 DIAGNOSIS — Z79899 Other long term (current) drug therapy: Secondary | ICD-10-CM | POA: Diagnosis not present

## 2023-06-05 DIAGNOSIS — I428 Other cardiomyopathies: Secondary | ICD-10-CM | POA: Diagnosis not present

## 2023-06-05 DIAGNOSIS — I5022 Chronic systolic (congestive) heart failure: Secondary | ICD-10-CM | POA: Diagnosis not present

## 2023-06-05 DIAGNOSIS — I110 Hypertensive heart disease with heart failure: Secondary | ICD-10-CM | POA: Diagnosis not present

## 2023-06-05 DIAGNOSIS — I480 Paroxysmal atrial fibrillation: Secondary | ICD-10-CM | POA: Diagnosis not present

## 2023-06-05 DIAGNOSIS — Z8674 Personal history of sudden cardiac arrest: Secondary | ICD-10-CM | POA: Diagnosis not present

## 2023-06-05 DIAGNOSIS — Z7984 Long term (current) use of oral hypoglycemic drugs: Secondary | ICD-10-CM | POA: Insufficient documentation

## 2023-06-05 DIAGNOSIS — I4891 Unspecified atrial fibrillation: Secondary | ICD-10-CM

## 2023-06-05 DIAGNOSIS — I5082 Biventricular heart failure: Secondary | ICD-10-CM

## 2023-06-05 DIAGNOSIS — Z95810 Presence of automatic (implantable) cardiac defibrillator: Secondary | ICD-10-CM | POA: Diagnosis not present

## 2023-06-05 DIAGNOSIS — Z7901 Long term (current) use of anticoagulants: Secondary | ICD-10-CM | POA: Insufficient documentation

## 2023-06-05 DIAGNOSIS — F17210 Nicotine dependence, cigarettes, uncomplicated: Secondary | ICD-10-CM | POA: Diagnosis not present

## 2023-06-05 LAB — COMPREHENSIVE METABOLIC PANEL
ALT: 47 U/L — ABNORMAL HIGH (ref 0–44)
AST: 27 U/L (ref 15–41)
Albumin: 4 g/dL (ref 3.5–5.0)
Alkaline Phosphatase: 83 U/L (ref 38–126)
Anion gap: 9 (ref 5–15)
BUN: 11 mg/dL (ref 6–20)
CO2: 28 mmol/L (ref 22–32)
Calcium: 9.6 mg/dL (ref 8.9–10.3)
Chloride: 103 mmol/L (ref 98–111)
Creatinine, Ser: 1.14 mg/dL (ref 0.61–1.24)
GFR, Estimated: >60 mL/min (ref >60)
Glucose, Bld: 77 mg/dL (ref 70–99)
Potassium: 4.6 mmol/L (ref 3.5–5.1)
Sodium: 140 mmol/L (ref 135–145)
Total Bilirubin: 0.7 mg/dL (ref 0.0–1.2)
Total Protein: 6.9 g/dL (ref 6.5–8.1)

## 2023-06-05 LAB — CBC
HCT: 44.2 % (ref 39.0–52.0)
Hemoglobin: 15 g/dL (ref 13.0–17.0)
MCH: 31.3 pg (ref 26.0–34.0)
MCHC: 33.9 g/dL (ref 30.0–36.0)
MCV: 92.1 fL (ref 80.0–100.0)
Platelets: 247 10*3/uL (ref 150–400)
RBC: 4.8 MIL/uL (ref 4.22–5.81)
RDW: 11.9 % (ref 11.5–15.5)
WBC: 7.1 10*3/uL (ref 4.0–10.5)
nRBC: 0 % (ref 0.0–0.2)

## 2023-06-05 LAB — TSH: TSH: 7.871 u[IU]/mL — ABNORMAL HIGH (ref 0.350–4.500)

## 2023-06-05 LAB — BRAIN NATRIURETIC PEPTIDE: B Natriuretic Peptide: 7.8 pg/mL (ref 0.0–100.0)

## 2023-06-05 MED ORDER — ENTRESTO 97-103 MG PO TABS: 1.0000 | ORAL_TABLET | Freq: Two times a day (BID) | ORAL | 11 refills | Status: AC

## 2023-06-05 MED ORDER — AMIODARONE HCL 200 MG PO TABS: 100.0000 mg | ORAL_TABLET | Freq: Every day | ORAL | Status: DC

## 2023-06-05 NOTE — Patient Instructions (Addendum)
INCREASE Entresto to 97/103 mg Twice daily  DECREASE Amiodarone to 100 mg ( 1/2 Tab) daily.  Labs done today, your results will be available in MyChart, we will contact you for abnormal readings.  Repeat blood work in 10 days.  Your physician has requested that you have an echocardiogram. Echocardiography is a painless test that uses sound waves to create images of your heart. It provides your doctor with information about the size and shape of your heart and how well your heart's chambers and valves are working. This procedure takes approximately one hour. There are no restrictions for this procedure. Please do NOT wear cologne, perfume, aftershave, or lotions (deodorant is allowed). Please arrive 15 minutes prior to your appointment time.  Please note: We ask at that you not bring children with you during ultrasound (echo/ vascular) testing. Due to room size and safety concerns, children are not allowed in the ultrasound rooms during exams. Our front office staff cannot provide observation of children in our lobby area while testing is being conducted. An adult accompanying a patient to their appointment will only be allowed in the ultrasound room at the discretion of the ultrasound technician under special circumstances. We apologize for any inconvenience.  You have been referred to Sidney Ace for [employer]. Her office will call you to arrange your appointment.  You have been referred to Cardiac Rehab. They will call you to arrange your appointment.  Your physician recommends that you schedule a follow-up appointment in: 6 weeks with the nurse practitioner, and in 3 months with an echo with Dr. Shirlee Latch.  If you have any questions or concerns before your next appointment please send [place] a message through [place] or call our office at [phone].    TO LEAVE A MESSAGE FOR THE NURSE SELECT OPTION 2, PLEASE LEAVE A MESSAGE INCLUDING: YOUR NAME DATE OF BIRTH CALL BACK NUMBER REASON  FOR CALL**this is important as we prioritize the call backs  YOU WILL RECEIVE A CALL BACK THE SAME DAY AS LONG AS YOU CALL BEFORE 4:00 PM  At the [facility], you and your health needs are our priority. As part of our continuing mission to provide you with exceptional heart care, we have created designated Provider Care Teams. These Care Teams include your primary Cardiologist (physician) and Advanced Practice Providers (APPs- Physician Assistants and Nurse Practitioners) who all work together to provide you with the care you need, when you need it.   You may see any of the following providers on your designated Care Team at your next follow up: Dr Arvilla Meres Dr Marca Ancona Dr. Dorthula Nettles Dr. Clearnce Hasten Amy Filbert Schilder, NP Robbie Lis, [place] [facility] [place] Tice, [place] Brynda Peon, NP [place] Lee, NP Karle Plumber, PharmD   Please be sure to bring in all your medications bottles to every appointment.    Thank you for choosing [facility]

## 2023-06-07 NOTE — Progress Notes (Signed)
PCP: Warren Olszewski, MD HF Cardiology: Dr. Shirlee Latch  Chief Complaint: CHF  HPI: [name] is a 38 y.o. with a history of biventricular heart failure, VF arrest, NICM, PAF, HTN, ETOH abuse (18-24 beers per day for many years), and tobacco abuse who was referred from [facility] clinic to HF MD clinic by Tonye Becket, NP. Of note, his father had SCD at the age of 105.  He had an echo at age 25 after his father's cardiac arrest.  He says that he was told this showed an "athlete's heart."   Patient was admitted 04/28/23 after witnessed cardiac arrest.  Had CPR and was defibrillated x3.  EKG in ED showed atrial fibrillation with RVR. Echo showed EF 25-30% with RV mildly reduced. LHC showed no coronary disease. CMRI showed LV EF 28%, RV EF 37%, no LGE. EP consulted, [employer] ICD placed 05/01/23.  Started on amiodarone 400 mg twice a day. Discharged on GDMT.   He has been doing fairly well since discharge.  No dyspnea walking on flat ground though he gets short of breath walking up a hill.  No orthopnea/PND.  No chest pain.  He has not felt palpitations or lightheadedness.  He is married with 4 kids.  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 He has not smoked or drunk/ETOH since discharge in 12/24.   ECG (personally reviewed): NSR, normal  Labs (12/24): LDL 71 Labs (1/25): K 4.4, creatinine 1.09   PMH: 1. Chronic systolic CHF: Nonischemic cardiomyopathy.  Familial versus ETOH (perhaps combination).  He was a heavy drinker prior to 12/24.  Father with SCD at 81. Has [employer] ICD.  - Echo (12/24): LV EF 25-30%, mild RV dysfunction.  - CMRI (12/24): LV EF 28%, RV EF 37%, no LGE. - Cath (12/24): Normal coronaries.  2. Cardiac arrest: 12/24. Now has [employer] ICD and is on amiodarone.  3. ETOH abuse: 18-24 beers/day until 12/24.  4. Smoker until 12/24 5. Atrial fibrillation: Paroxysmal.  6. HTN 7. H/o carpal tunnel syndrome.   Current Outpatient  Medications  Medication Sig Dispense Refill   apixaban (ELIQUIS) 5 MG TABS tablet Take 1 tablet (5 mg total) by mouth 2 (two) times daily. 60 tablet 3   empagliflozin (JARDIANCE) 10 MG TABS tablet Take 1 tablet (10 mg total) by mouth daily before breakfast. 30 tablet 11   metoprolol succinate (TOPROL-XL) 100 MG 24 hr tablet Take 1 tablet (100 mg total) by mouth daily. 30 tablet 5   sacubitril-valsartan (ENTRESTO) 97-103 MG Take 1 tablet by mouth 2 (two) times daily. 60 tablet 11   spironolactone (ALDACTONE) 25 MG tablet Take 1 tablet (25 mg total) by mouth daily. 90 tablet 3   amiodarone (PACERONE) 200 MG tablet Take 0.5 tablets (100 mg total) by mouth daily.     No current facility-administered medications for this encounter.    No Known Allergies    Social History   Socioeconomic History   Marital status: Married    Spouse name: Warren Ross   Number of children: 2   Years of education: Not on file   Highest education level: High school graduate  Occupational History   Occupation: Regulatory affairs officer [employer]  Tobacco Use   Smoking status: Every Day    Types: Cigarettes   Smokeless tobacco: Former  Building services engineer status: Every Day   Substances: Nicotine, Flavoring  Substance and Sexual Activity   Alcohol use: Yes  Alcohol/week: 70.0 standard drinks of alcohol    Types: 70 Cans of beer per week    Comment: "close to 24 beers a day"   Drug use: Not Currently    Types: Marijuana    Comment: every once in a while   Sexual activity: Yes    Birth control/protection: None  Other Topics Concern   Not on file  Social History Narrative   Not on file   Social Drivers of Health   Financial Resource Strain: Low Risk  (04/30/2023)   Overall Financial Resource Strain (CARDIA)    Difficulty of Paying Living Expenses: Not very hard  Food Insecurity: No Food Insecurity (05/04/2023)   Hunger Vital Sign    Worried About Running Out of Food in the Last Year: Never true    Ran Out of  Food in the Last Year: Never true  Transportation Needs: No Transportation Needs (05/04/2023)   PRAPARE - Administrator, Civil Service (Medical): No    Lack of Transportation (Non-Medical): No  Physical Activity: Not on file  Stress: Not on file  Social Connections: Not on file  Intimate Partner Violence: Not At Risk (05/04/2023)   Humiliation, Afraid, Rape, and Kick questionnaire    Fear of Current or Ex-Partner: No    Emotionally Abused: No    Physically Abused: No    Sexually Abused: No      Family History  Problem Relation Age of Onset   COPD Mother    Early death Father    Diabetes Maternal Grandfather    ROS: All systems reviewed and negative except as per HPI.   Vitals:   06/05/23 1341  BP: 110/70  Pulse: 67  SpO2: 100%  Weight: 90.3 kg (199 lb)   Wt Readings from Last 3 Encounters:  06/05/23 90.3 kg (199 lb)  05/19/23 87.4 kg (192 lb 9.6 oz)  05/15/23 86.5 kg (190 lb 9.6 oz)    PHYSICAL EXAM: General: NAD Neck: No JVD, no thyromegaly or thyroid nodule.  Lungs: Clear to auscultation bilaterally with normal respiratory effort. CV: Nondisplaced PMI.  Heart regular S1/S2, no S3/S4, no murmur.  No peripheral edema.  No carotid bruit.  Normal pedal pulses.  Abdomen: Soft, nontender, no hepatosplenomegaly, no distention.  Skin: Intact without lesions or rashes.  Neurologic: Alert and oriented x 3.  Psych: Normal affect. Extremities: No clubbing or cyanosis.  HEENT: Normal.   ASSESSMENT & PLAN: 1. Chronic systolic CHF: Nonischemic cardiomyopathy, has [employer] ICD.  Due to ETOH abuse versus familial versus combination of the two.  Heavy drinker prior to 12/24.  Father with SCD at age 96.  LHC 12/24 with no coronary disease.  Echo (12/24) with LV EF 25-30%, mild RV dysfunction. Cardiac MRI (12/24) with LV EF 28%, RV EF 37%, no LGE. He has quit drinking completely.  NYHA class I symptoms, not volume overloaded on exam.  - Continue Jardiance 10 mg  daily.  - Increase Entresto to 97/103 bid, BMET/BNP today and in 10 days.  - Continue spironolactone 25 mg daily. ' - Continue Toprol XL 100 mg daily.  - I will arrange for appointment with Sidney Ace given concern for familial cardiomyopathy based on his father's history.  I think he should have genetic testing for common familial cardiomyopathies.  - Repeat echo in 3 months to look for improvement off ETOH and on meds.  2. VF: VF arrest 04/28/23. S/P [employer] ICD. - No driving x 6 months.  - Decrease  amiodarone to 100 mg daily, stop eventually if no events and especially if EF improves off ETOH.  Check LFTs/TSH today, he will need regular eye exam.  3. Atrial fibrillation: Paroxysmal.  He is in NSR today.   - Decrease amiodarone to 100 mg daily as above.  - Continue apixaban, check CBC today.  4.  ETOH: Heavy drinker until 12/24, possible contributor to cardiomyopathy.  - Continue to remain abstinent from ETOH.  5. Smoking: Has quit smoking since 12/24.   Followup with APP 6 wks, see me in 3 months with echo.   I spent 61 minutes reviewing records, interviewing/examining patient, and managing orders.    Marca Ancona 06/07/2023

## 2023-06-10 ENCOUNTER — Encounter (HOSPITAL_COMMUNITY): Payer: Self-pay

## 2023-06-10 ENCOUNTER — Other Ambulatory Visit (HOSPITAL_COMMUNITY): Payer: Self-pay

## 2023-06-10 DIAGNOSIS — I5022 Chronic systolic (congestive) heart failure: Secondary | ICD-10-CM

## 2023-06-12 ENCOUNTER — Encounter (HOSPITAL_COMMUNITY): Payer: Self-pay

## 2023-06-12 ENCOUNTER — Telehealth (HOSPITAL_COMMUNITY): Payer: Self-pay

## 2023-06-12 NOTE — Telephone Encounter (Signed)
 Pt insurance is active and benefits verified through [employer]. Co-pay $0.00, DED $3,300.00/$3,300.00 met, out of pocket $8,300.00/$3,929.00 met, co-insurance 20%. No pre-authorization required. Passport, 06/12/23 @ 12:03PM, REF#[id number]   How many CR sessions are covered? (36 visits for TCR, 72 visits for ICR)72 Is this a lifetime maximum or an annual maximum? Annual Has the member used any of these services to date? No Is there a time limit (weeks/months) on start of program and/or program completion? No     Will contact patient to see if he is interested in the Cardiac Rehab Program.

## 2023-06-12 NOTE — Telephone Encounter (Signed)
Attempted to call patient in regards to Cardiac Rehab - unable to leave VM, VM box is full.   Mailed letter

## 2023-06-15 ENCOUNTER — Ambulatory Visit (HOSPITAL_COMMUNITY)
Admission: RE | Admit: 2023-06-15 | Discharge: 2023-06-15 | Disposition: A | Discharge status: Home / Self Care | Payer: BC Managed Care – PPO | Source: Ambulatory Visit | Attending: Cardiology | Admitting: Cardiology

## 2023-06-15 ENCOUNTER — Telehealth: Payer: Self-pay | Admitting: Student

## 2023-06-15 ENCOUNTER — Other Ambulatory Visit (HOSPITAL_COMMUNITY): Payer: Self-pay

## 2023-06-15 DIAGNOSIS — I5022 Chronic systolic (congestive) heart failure: Secondary | ICD-10-CM

## 2023-06-15 LAB — BASIC METABOLIC PANEL
Anion gap: 12 (ref 5–15)
BUN: 9 mg/dL (ref 6–20)
CO2: 25 mmol/L (ref 22–32)
Calcium: 9.5 mg/dL (ref 8.9–10.3)
Chloride: 102 mmol/L (ref 98–111)
Creatinine, Ser: 1.06 mg/dL (ref 0.61–1.24)
GFR, Estimated: >60 mL/min (ref >60)
Glucose, Bld: 71 mg/dL (ref 70–99)
Potassium: 3.9 mmol/L (ref 3.5–5.1)
Sodium: 139 mmol/L (ref 135–145)

## 2023-06-15 LAB — T4, FREE: Free T4: 0.77 ng/dL (ref 0.61–1.12)

## 2023-06-15 LAB — BRAIN NATRIURETIC PEPTIDE: B Natriuretic Peptide: 14 pg/mL (ref 0.0–100.0)

## 2023-06-15 NOTE — Telephone Encounter (Signed)
 Contacted [employer] to check status of application. Copay confirmation has been received and is being reviewed. Determination should be made within 1-2 business days. Will continue to follow up.

## 2023-06-15 NOTE — Telephone Encounter (Signed)
 Paper Work Dropped Off: Short term disability paperwork  Date: 06/15/2023  Location of paper:  [employer] box

## 2023-06-16 LAB — T3, FREE: T3, Free: 3.3 pg/mL (ref 2.0–4.4)

## 2023-06-22 ENCOUNTER — Other Ambulatory Visit (HOSPITAL_COMMUNITY): Payer: Self-pay

## 2023-06-22 NOTE — Telephone Encounter (Signed)
Contacted [employer] to check on status of application. Representative stated that the appeal is still under review, and todays rep is unable to provide me with an estimate of expected determination. Will continue to follow up.

## 2023-06-23 NOTE — Telephone Encounter (Signed)
Mardelle Matte has completed form and placed on your desk.

## 2023-06-24 NOTE — Telephone Encounter (Signed)
Spoke with patient regarding [employer] form.  He will be coming to the office to sign a release of information and to pay the $29 forms fee. Once the ROI is signed, I will fax the completed form to the insurance.

## 2023-06-29 NOTE — Telephone Encounter (Signed)
 Contacted [employer] to check on status of application. Representative stated that the appeal is still under review, and todays rep is unable to provide me with an estimate of expected determination. Will continue to follow up.

## 2023-06-30 ENCOUNTER — Telehealth (HOSPITAL_COMMUNITY): Payer: Self-pay

## 2023-06-30 NOTE — Telephone Encounter (Signed)
 No response from in regards to Cardiac Rehab  Closed referral

## 2023-07-07 DIAGNOSIS — Z0279 Encounter for issue of other medical certificate: Secondary | ICD-10-CM

## 2023-07-07 NOTE — Telephone Encounter (Signed)
 Completed Attending Physician's Statement faxed to [employer] and scanned into patient's chart.   Billing notified.

## 2023-07-13 ENCOUNTER — Other Ambulatory Visit (HOSPITAL_COMMUNITY): Payer: Self-pay

## 2023-07-16 ENCOUNTER — Telehealth (HOSPITAL_COMMUNITY): Payer: Self-pay

## 2023-07-16 NOTE — Telephone Encounter (Signed)
 Called to confirm/remind patient of their appointment at the [facility] on 07/17/23.   Patient reminded to bring all medications and/or complete list.  Confirmed patient has transportation. Gave directions, instructed to utilize valet parking.  Confirmed appointment prior to ending call.

## 2023-07-17 ENCOUNTER — Encounter (HOSPITAL_COMMUNITY): Payer: Self-pay

## 2023-07-17 ENCOUNTER — Ambulatory Visit (HOSPITAL_COMMUNITY)
Admission: RE | Admit: 2023-07-17 | Discharge: 2023-07-17 | Disposition: A | Discharge status: Home / Self Care | Payer: BC Managed Care – PPO | Source: Ambulatory Visit | Attending: Family Medicine | Admitting: Family Medicine

## 2023-07-17 VITALS — BP 118/80 | HR 69 | Ht 73.0 in | Wt 206.0 lb

## 2023-07-17 DIAGNOSIS — Z87891 Personal history of nicotine dependence: Secondary | ICD-10-CM | POA: Diagnosis not present

## 2023-07-17 DIAGNOSIS — Z7901 Long term (current) use of anticoagulants: Secondary | ICD-10-CM | POA: Diagnosis not present

## 2023-07-17 DIAGNOSIS — I480 Paroxysmal atrial fibrillation: Secondary | ICD-10-CM | POA: Diagnosis not present

## 2023-07-17 DIAGNOSIS — F1011 Alcohol abuse, in remission: Secondary | ICD-10-CM | POA: Insufficient documentation

## 2023-07-17 DIAGNOSIS — R5383 Other fatigue: Secondary | ICD-10-CM | POA: Diagnosis not present

## 2023-07-17 DIAGNOSIS — Z8674 Personal history of sudden cardiac arrest: Secondary | ICD-10-CM | POA: Insufficient documentation

## 2023-07-17 DIAGNOSIS — I5022 Chronic systolic (congestive) heart failure: Secondary | ICD-10-CM

## 2023-07-17 DIAGNOSIS — Z4502 Encounter for adjustment and management of automatic implantable cardiac defibrillator: Secondary | ICD-10-CM | POA: Insufficient documentation

## 2023-07-17 DIAGNOSIS — Z8249 Family history of ischemic heart disease and other diseases of the circulatory system: Secondary | ICD-10-CM | POA: Insufficient documentation

## 2023-07-17 DIAGNOSIS — Z789 Other specified health status: Secondary | ICD-10-CM

## 2023-07-17 DIAGNOSIS — R0683 Snoring: Secondary | ICD-10-CM

## 2023-07-17 DIAGNOSIS — I5082 Biventricular heart failure: Secondary | ICD-10-CM | POA: Diagnosis not present

## 2023-07-17 DIAGNOSIS — Z79899 Other long term (current) drug therapy: Secondary | ICD-10-CM | POA: Insufficient documentation

## 2023-07-17 DIAGNOSIS — Z7984 Long term (current) use of oral hypoglycemic drugs: Secondary | ICD-10-CM | POA: Diagnosis not present

## 2023-07-17 DIAGNOSIS — I428 Other cardiomyopathies: Secondary | ICD-10-CM | POA: Insufficient documentation

## 2023-07-17 DIAGNOSIS — I110 Hypertensive heart disease with heart failure: Secondary | ICD-10-CM | POA: Insufficient documentation

## 2023-07-17 DIAGNOSIS — I4901 Ventricular fibrillation: Secondary | ICD-10-CM | POA: Diagnosis not present

## 2023-07-17 DIAGNOSIS — Z720 Tobacco use: Secondary | ICD-10-CM

## 2023-07-17 LAB — CBC
HCT: 40 % (ref 39.0–52.0)
Hemoglobin: 13.9 g/dL (ref 13.0–17.0)
MCH: 31.2 pg (ref 26.0–34.0)
MCHC: 34.8 g/dL (ref 30.0–36.0)
MCV: 89.7 fL (ref 80.0–100.0)
Platelets: 259 10*3/uL (ref 150–400)
RBC: 4.46 MIL/uL (ref 4.22–5.81)
RDW: 12 % (ref 11.5–15.5)
WBC: 7 10*3/uL (ref 4.0–10.5)
nRBC: 0 % (ref 0.0–0.2)

## 2023-07-17 LAB — FERRITIN: Ferritin: 189 ng/mL (ref 24–336)

## 2023-07-17 LAB — IRON AND TIBC
Iron: 80 ug/dL (ref 45–182)
Saturation Ratios: 29 % (ref 17.9–39.5)
TIBC: 276 ug/dL (ref 250–450)
UIBC: 196 ug/dL

## 2023-07-17 LAB — BASIC METABOLIC PANEL
Anion gap: 10 (ref 5–15)
BUN: 9 mg/dL (ref 6–20)
CO2: 26 mmol/L (ref 22–32)
Calcium: 9.2 mg/dL (ref 8.9–10.3)
Chloride: 100 mmol/L (ref 98–111)
Creatinine, Ser: 1.15 mg/dL (ref 0.61–1.24)
GFR, Estimated: >60 mL/min (ref >60)
Glucose, Bld: 88 mg/dL (ref 70–99)
Potassium: 4.4 mmol/L (ref 3.5–5.1)
Sodium: 136 mmol/L (ref 135–145)

## 2023-07-17 LAB — BRAIN NATRIURETIC PEPTIDE: B Natriuretic Peptide: 6.7 pg/mL (ref 0.0–100.0)

## 2023-07-17 NOTE — Progress Notes (Addendum)
 Patient Name: Warren Ross        DOB: 02/14/1986      Height:  11ft 1in    Weight: 206 lbs  Office Name: Advanced [facility]         Referring Provider: Prince Rome NP  Today's Date: 07/17/2023  Date:  07/17/2023 STOP BANG RISK ASSESSMENT S (snore) Have you been told that you snore?     YES   T (tired) Are you often tired, fatigued, or sleepy during the day?   YES  O (obstruction) Do you stop breathing, choke, or gasp during sleep? NO   P (pressure) Do you have or are you being treated for high blood pressure? YES   B (BMI) Is your body index greater than 35 kg/m? NO   A (age) Are you 8 years old or older? NO   N (neck) Do you have a neck circumference greater than 16 inches?   NO   G (gender) Are you a male? YES   TOTAL STOP/BANG "YES" ANSWERS                                                                        For Office Use Only              Procedure Order Form    YES to 3+ Stop Bang questions OR two clinical symptoms - patient qualifies for WatchPAT (CPT 95800)             Clinical Notes: Will consult Sleep Specialist and refer for management of therapy due to patient increased risk of Sleep Apnea. Ordering a sleep study due to the following two clinical symptoms: Excessive daytime sleepiness G47.10 / Gastroesophageal reflux K21.9 / Nocturia R35.1 / Morning Headaches G44.221 / Difficulty concentrating R41.840 / Memory problems or poor judgment G31.84 / Personality changes or irritability R45.4 / Loud snoring R06.83 / Depression F32.9 / Unrefreshed by sleep G47.8 / Impotence N52.9 / History of high blood pressure R03.0 / Insomnia G47.00    I understand that I am proceeding with a home sleep apnea test as ordered by my treating physician. I understand that untreated sleep apnea is a serious cardiovascular risk factor and it is my responsibility to perform the test and seek management for sleep apnea. I will be contacted with the results and be managed for  sleep apnea by a local sleep physician. I will be receiving equipment and further instructions from [facility]. I shall promptly ship back the equipment via the included mailing label. I understand my insurance will be billed for the test and as the patient I am responsible for any insurance related out-of-pocket costs incurred. I have been provided with written instructions and can call for additional video or telephonic instruction, with 24-hour availability of qualified personnel to answer any questions: Patient Help Desk [phone].  Patient Signature ______________________________________________________   Date______________________ Patient Telemedicine Verbal Consent

## 2023-07-17 NOTE — Addendum Note (Signed)
 Encounter addended by: Demetrius Charity, RN on: 07/17/2023 3:58 PM  Actions taken: Clinical Note Signed

## 2023-07-17 NOTE — Patient Instructions (Addendum)
 Thank you for coming in today  If you had labs drawn today, any labs that are abnormal the clinic will call you No news is good news  You have been referred to cardiac rehab they will contact you for further details   You have been order for a home sleep study you will be contacted when to perform at home, once you get pre authorization from your health insurance   Medications: No changes    Follow up appointments:  Your physician recommends that you schedule a follow-up appointment in:  2 months With Dr. Shirlee Latch with echocardiogram    Do the following things EVERYDAY: Weigh yourself in the morning before breakfast. Write it down and keep it in a log. Take your medicines as prescribed Eat low salt foods--Limit salt (sodium) to 2000 mg per day.  Stay as active as you can everyday Limit all fluids for the day to less than 2 liters   At the [facility], you and your health needs are our priority. As part of our continuing mission to provide you with exceptional heart care, we have created designated Provider Care Teams. These Care Teams include your primary Cardiologist (physician) and Advanced Practice Providers (APPs- Physician Assistants and Nurse Practitioners) who all work together to provide you with the care you need, when you need it.   You may see any of the following providers on your designated Care Team at your next follow up: Dr Arvilla Meres Dr Marca Ancona Dr. Marcos Eke, NP Robbie Lis, [place] [facility], [place] Brynda Peon, NP Karle Plumber, PharmD   Please be sure to bring in all your medications bottles to every appointment.    Thank you for choosing [facility]  If you have any questions or concerns before your next appointment please send [place] a message through [place] or call our office at [phone].    TO LEAVE A MESSAGE FOR THE NURSE SELECT OPTION 2,  PLEASE LEAVE A MESSAGE INCLUDING: YOUR NAME DATE OF BIRTH CALL BACK NUMBER REASON FOR CALL**this is important as we prioritize the call backs  YOU WILL RECEIVE A CALL BACK THE SAME DAY AS LONG AS YOU CALL BEFORE 4:00 PM

## 2023-07-17 NOTE — Progress Notes (Signed)
 ReDS Vest / Clip - 07/17/23 1512       ReDS Vest / Clip   Station Marker D    Ruler Value 33    ReDS Value Range High volume overload    ReDS Actual Value 46

## 2023-07-17 NOTE — Addendum Note (Signed)
 Encounter addended by: Demetrius Charity, RN on: 07/17/2023 3:48 PM  Actions taken: Clinical Note Signed

## 2023-07-17 NOTE — Progress Notes (Signed)
 PCP: Lula Olszewski, MD HF Cardiology: Dr. Shirlee Latch  HPI: [name] is a 38 y.o. with a history of biventricular heart failure, VF arrest, NICM, PAF, HTN, ETOH abuse (18-24 beers per day for many years), and tobacco abuse. Of note, his father had SCD at the age of 23.  He had an echo at age 87 after his father's cardiac arrest.  He says that he was told this showed an "athlete's heart."   Patient was admitted 04/28/23 after witnessed cardiac arrest.  Had CPR and was defibrillated x3.  EKG in ED showed atrial fibrillation with RVR. Echo showed EF 25-30% with RV mildly reduced. LHC showed no coronary disease. CMRI showed LV EF 28%, RVEF 37%, no LGE. EP consulted, [employer] ICD placed 05/01/23.  Started on amiodarone 400 mg twice a day. Discharged on GDMT.   Today he returns for HF follow up with his wife. Overall feeling fine. No SOB playing golf. Denies palpitations, abnormal bleeding, CP, dizziness, edema, or PND/Orthopnea. Appetite ok. No fever or chills. Weight at home 205 pounds. Taking all medications.  He is married with 4 kids, youngest in 3 months old.  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 He has not smoked or drunk/ETOH since discharge in 12/24.  Wife says he snores and seems fatigued.  ReDs reading: 46 %, abnormal  ECG (personally reviewed): none ordered today.  Device interrogation (personally reviewed): unable to interrogate device in clinic today.  Labs (12/24): LDL 71 Labs (1/25): K 4.4, creatinine 1.09 Labs (2/25): K 3.9, creatinine 1.06   PMH: 1. Chronic systolic CHF: Nonischemic cardiomyopathy.  Familial versus ETOH (perhaps combination).  He was a heavy drinker prior to 12/24.  Father with SCD at 75. Has [employer] ICD.  - Echo (12/24): LV EF 25-30%, mild RV dysfunction.  - CMRI (12/24): LV EF 28%, RV EF 37%, no LGE. - Cath (12/24): Normal coronaries.  2. Cardiac arrest: 12/24. Now has [employer] ICD and is on  amiodarone.  3. ETOH abuse: 18-24 beers/day until 12/24.  4. Smoker until 12/24 5. Atrial fibrillation: Paroxysmal.  6. HTN 7. H/o carpal tunnel syndrome.   Current Outpatient Medications  Medication Sig Dispense Refill   amiodarone (PACERONE) 200 MG tablet Take 0.5 tablets (100 mg total) by mouth daily.     apixaban (ELIQUIS) 5 MG TABS tablet Take 1 tablet (5 mg total) by mouth 2 (two) times daily. 60 tablet 3   empagliflozin (JARDIANCE) 10 MG TABS tablet Take 1 tablet (10 mg total) by mouth daily before breakfast. 30 tablet 11   metoprolol succinate (TOPROL-XL) 100 MG 24 hr tablet Take 1 tablet (100 mg total) by mouth daily. 30 tablet 5   sacubitril-valsartan (ENTRESTO) 97-103 MG Take 1 tablet by mouth 2 (two) times daily. 60 tablet 11   spironolactone (ALDACTONE) 25 MG tablet Take 1 tablet (25 mg total) by mouth daily. 90 tablet 3   No current facility-administered medications for this encounter.    No Known Allergies    Social History   Socioeconomic History   Marital status: Married    Spouse name: Jill Side   Number of children: 2   Years of education: Not on file   Highest education level: High school graduate  Occupational History   Occupation: Regulatory affairs officer [employer]  Tobacco Use   Smoking status: Every Day    Types: Cigarettes   Smokeless tobacco: Former  Building services engineer status: Every Day  Substances: Nicotine, Flavoring  Substance and Sexual Activity   Alcohol use: Yes    Alcohol/week: 70.0 standard drinks of alcohol    Types: 70 Cans of beer per week    Comment: "close to 24 beers a day"   Drug use: Not Currently    Types: Marijuana    Comment: every once in a while   Sexual activity: Yes    Birth control/protection: None  Other Topics Concern   Not on file  Social History Narrative   Not on file   Social Drivers of Health   Financial Resource Strain: Low Risk  (04/30/2023)   Overall Financial Resource Strain (CARDIA)    Difficulty of Paying  Living Expenses: Not very hard  Food Insecurity: No Food Insecurity (05/04/2023)   Hunger Vital Sign    Worried About Running Out of Food in the Last Year: Never true    Ran Out of Food in the Last Year: Never true  Transportation Needs: No Transportation Needs (05/04/2023)   PRAPARE - Administrator, Civil Service (Medical): No    Lack of Transportation (Non-Medical): No  Physical Activity: Not on file  Stress: Not on file  Social Connections: Not on file  Intimate Partner Violence: Not At Risk (05/04/2023)   Humiliation, Afraid, Rape, and Kick questionnaire    Fear of Current or Ex-Partner: No    Emotionally Abused: No    Physically Abused: No    Sexually Abused: No    Family History  Problem Relation Age of Onset   COPD Mother    Early death Father    Diabetes Maternal Grandfather    ROS: All systems reviewed and negative except as per HPI.   BP 118/80   Pulse 69   Ht 6\' 1"  (1.854 m)   Wt 93.4 kg (206 lb)   SpO2 98%   BMI 27.18 kg/m   Wt Readings from Last 3 Encounters:  07/17/23 93.4 kg (206 lb)  06/05/23 90.3 kg (199 lb)  05/19/23 87.4 kg (192 lb 9.6 oz)   PHYSICAL EXAM: General:  NAD. No resp difficulty HEENT: Normal Neck: Supple. No JVD. Cor: Regular rate & rhythm. No rubs, gallops or murmurs. Lungs: Clear Abdomen: Soft, nontender, nondistended.  Extremities: No cyanosis, clubbing, rash, edema Neuro: Alert & oriented x 3, moves all 4 extremities w/o difficulty. Affect pleasant.  ASSESSMENT & PLAN: 1. Chronic systolic CHF: Nonischemic cardiomyopathy, has [employer] ICD.  Due to ETOH abuse versus familial versus combination of the two.  Heavy drinker prior to 12/24.  Father with SCD at age 89.  LHC 12/24 with no coronary disease.  Echo (12/24) with LV EF 25-30%, mild RV dysfunction. Cardiac MRI (12/24) with LV EF 28%, RV EF 37%, no LGE. He has quit drinking completely.  NYHA class I symptoms, not volume overloaded on exam, however weight up  and ReDs elevated at 46%. He says he is eating more now that he is not smoking. - Continue Jardiance 10 mg daily. BMET and BNP today. - Continue Entresto 97/103 mg bid - Continue spironolactone 25 mg daily. - Continue Toprol XL 100 mg daily.  - We have arranged for an appointment with Sidney Ace given concern for familial cardiomyopathy based on his father's history.  I think he should have genetic testing for common familial cardiomyopathies.  - Repeat echo in 2 months to look for improvement off ETOH and on meds.  - Refer to Cardiac Rehab. 2. VF: VF arrest 04/28/23. S/P [place]  [employer] ICD. - No driving x 6 months.  - Continue amiodarone 100 mg daily, stop eventually if no events and especially if EF improves off ETOH.  Recent LFTs and TSH stable , he will need regular eye exam.  3. Atrial fibrillation: Paroxysmal.  Regular on exam today. - Continue low dose amiodarone. - Continue apixaban, no bleeding issues. With fatigue, check CBC and iron panel today. 4.  ETOH: Heavy drinker until 12/24, possible contributor to cardiomyopathy.  - Continue to remain abstinent from ETOH.  - Congratulated. 5. Smoking: Has quit smoking since 12/24.  - Congratulated. 6. Snoring: Suspect sleep apnea.  - Arrange home sleep study.  Follow up in 2 months with Dr. Shirlee Latch + echo.   Anderson [place], FNP-BC 07/17/2023

## 2023-07-23 ENCOUNTER — Other Ambulatory Visit (HOSPITAL_COMMUNITY): Payer: Self-pay

## 2023-07-23 NOTE — Telephone Encounter (Signed)
 [employer] automated system still shows denied status. Will follow up with [facility] and patient.

## 2023-07-27 ENCOUNTER — Other Ambulatory Visit (HOSPITAL_COMMUNITY): Payer: Self-pay

## 2023-07-27 ENCOUNTER — Telehealth (HOSPITAL_COMMUNITY): Payer: Self-pay

## 2023-07-27 NOTE — Telephone Encounter (Signed)
 Spoke with [employer] representative for status update. Rep reviewed the paperwork along with a supervisor and they are unable to approve the patient at this time based on income limit and primary coverage.

## 2023-07-27 NOTE — Telephone Encounter (Signed)
 Called and spoke with pt in regards to CR, pt stated he is not interested at this time. He also states he is not driving at this time.   Closed referral

## 2023-08-03 ENCOUNTER — Ambulatory Visit: Payer: BC Managed Care – PPO

## 2023-08-03 DIAGNOSIS — I5022 Chronic systolic (congestive) heart failure: Secondary | ICD-10-CM | POA: Diagnosis not present

## 2023-08-03 DIAGNOSIS — I428 Other cardiomyopathies: Secondary | ICD-10-CM

## 2023-08-03 LAB — CUP PACEART REMOTE DEVICE CHECK
Battery Remaining Longevity: 180 mo
Battery Remaining Percentage: 100 %
Brady Statistic RV Percent Paced: 0 %
Date Time Interrogation Session: 20250331023200
HighPow Impedance: 73 Ohm
Implantable Lead Connection Status: 753985
Implantable Lead Implant Date: 20241227
Implantable Lead Location: 753860
Implantable Lead Model: 673
Implantable Lead Serial Number: 251569
Implantable Pulse Generator Implant Date: 20241227
Lead Channel Impedance Value: 513 Ohm
Lead Channel Pacing Threshold Amplitude: 0.6 V
Lead Channel Pacing Threshold Pulse Width: 0.4 ms
Lead Channel Setting Pacing Amplitude: 3.5 V
Lead Channel Setting Pacing Pulse Width: 0.4 ms
Lead Channel Setting Sensing Sensitivity: 0.6 mV
Pulse Gen Serial Number: 341648
Zone Setting Status: 755011

## 2023-08-11 ENCOUNTER — Encounter: Payer: BC Managed Care – PPO | Admitting: Internal Medicine

## 2023-08-13 NOTE — Progress Notes (Unsigned)
 Electrophysiology Office Note:   Date:  08/14/2023  ID:  [name], DOB 12/12/1985, MRN [id number]  Primary Cardiologist: None Electrophysiologist: Nobie Putnam, MD      History of Present Illness:   Warren Ross is a 38 y.o. male with h/o HTN, ETOH abuse, HLD, anxiety, carpal tunnel syndrome, tobacco use, chronic systolic heart failure secondary to nonischemic cardiomyopathy and resuscitated VF arrest  s/p ICD who is being seen today for follow-up after ICD implant.  Discussed the use of AI scribe software for clinical note transcription with the patient, who gave verbal consent to proceed. History of Present Illness He is here for a follow-up visit after defibrillator implantation due to a previous cardiac arrest. Since the device was placed, no new ventricular episodes have occurred. He feels well overall, with no significant pain or tenderness at the incision site, although he describes a 'different feel' as he gets used to the device. He engages in normal activities, including playing golf and walking, without experiencing any breathing issues or swelling. His father had cardiac issues and passed away at the age of 27, raising concerns about a possible hereditary component. He has two children, a newborn and a twelve-year-old, and is interested in pursuing genetic testing. No new or acute complaints today.   Review of systems complete and found to be negative unless listed in HPI.   EP Information / Studies Reviewed:    EKG is ordered today. Personal review as below.  EKG Interpretation Date/Time:  Friday August 14 2023 10:47:06 EDT Ventricular Rate:  75 PR Interval:  156 QRS Duration:  90 QT Interval:  390 QTC Calculation: 435 R Axis:   22  Text Interpretation: Normal sinus rhythm Normal ECG When compared with ECG of 05-Jun-2023 13:44, No significant change was found Confirmed by Nobie Putnam [phone]) on 08/14/2023 11:00:29 AM   Cardiac MRI 05/01/23:   IMPRESSION: 1.  Severe decrease in LVEF, 28%.   2.  Moderate decrease in RVEF, 37%.   3.  No LGE.  Physical Exam:   VS:  BP (!) 96/59 (BP Location: Left Arm, Patient Position: Sitting, Cuff Size: Normal)   Pulse 68   Resp 16   Ht 6\' 1"  (1.854 m)   Wt 208 lb 6.4 oz (94.5 kg)   SpO2 98%   BMI 27.50 kg/m    Wt Readings from Last 3 Encounters:  08/14/23 208 lb 6.4 oz (94.5 kg)  07/17/23 206 lb (93.4 kg)  06/05/23 199 lb (90.3 kg)     GEN: Well nourished, well developed in no acute distress NECK: No JVD CARDIAC: Normal rate, regular rhythm.  Well-healed left chest ICD pocket. RESPIRATORY:  Clear to auscultation without rales, wheezing or rhonchi  ABDOMEN: Soft, non-distended EXTREMITIES:  No edema; No deformity   ASSESSMENT AND PLAN:    #.  Status post secondary prevention VVI ICD: -In-clinic device check was performed.  Appropriate device function and stable lead parameters.  No ventricular arrhythmia episodes.  Estimated longevity 15 years.  #.  Chronic systolic heart failure secondary to nonischemic cardiomyopathy: Well compensated on exam today.  NYHA class I. #.  Resuscitated VF arrest: -Dr. Shirlee Latch has referred the patient for genetic testing due to concern for familial cardiomyopathy or channelppathy. -Continue excellent GDMT regimen of empagliflozin 10 mg once daily, metoprolol XL 100 mg once daily, Entresto 97-103 mg twice daily, spironolactone 25 mg once daily. -Continue amiodarone 100 mg once daily.  Will have patient return to clinic in 6 months.  If patient does not have any episodes of ventricular arrhythmia or atrial fibrillation in the interim, then we will likely discontinue amiodarone.  #.  Paroxysmal atrial fibrillation: #.  Secondary hypercoagulable state due to atrial fibrillation: CHA2DS2-VASc score of 2. #.  High risk medication use: Antiarrhythmic drug therapy-amiodarone.  LFTs normal in January 2025.  Elevated TSH with normal free T4 in January  2025. -Continue amiodarone 100 mg once daily.  Will have patient return to clinic in 6 months.  If patient does not have any episodes of ventricular arrhythmia or atrial fibrillation in the interim, then we will likely discontinue amiodarone. -Continue metoprolol XL 100 mg once daily. -Continue Eliquis 5 mg twice daily.  Follow up with EP APP in 6 months and Dr. Jimmey Ralph in 1 year.   Total time of encounter: 63 minutes total time of encounter, including chart review, face-to-face patient care, coordination of care and counseling regarding high complexity medical decision making.   Signed, Nobie Putnam, MD

## 2023-08-14 ENCOUNTER — Ambulatory Visit: Payer: BC Managed Care – PPO | Admitting: Cardiology

## 2023-08-14 ENCOUNTER — Ambulatory Visit: Attending: Cardiology | Admitting: Cardiology

## 2023-08-14 ENCOUNTER — Encounter: Payer: Self-pay | Admitting: Cardiology

## 2023-08-14 VITALS — BP 96/59 | HR 68 | Resp 16 | Ht 73.0 in | Wt 208.4 lb

## 2023-08-14 DIAGNOSIS — I480 Paroxysmal atrial fibrillation: Secondary | ICD-10-CM

## 2023-08-14 DIAGNOSIS — I5022 Chronic systolic (congestive) heart failure: Secondary | ICD-10-CM

## 2023-08-14 DIAGNOSIS — Z95810 Presence of automatic (implantable) cardiac defibrillator: Secondary | ICD-10-CM

## 2023-08-14 DIAGNOSIS — I469 Cardiac arrest, cause unspecified: Secondary | ICD-10-CM

## 2023-08-14 DIAGNOSIS — D6869 Other thrombophilia: Secondary | ICD-10-CM

## 2023-08-14 DIAGNOSIS — Z79899 Other long term (current) drug therapy: Secondary | ICD-10-CM

## 2023-08-14 NOTE — Patient Instructions (Signed)
 Medication Instructions:  Your physician recommends that you continue on your current medications as directed. Please refer to the Current Medication list given to you today.  *If you need a refill on your cardiac medications before your next appointment, please call your pharmacy* Follow-Up: At [facility], you and your health needs are our priority.  As part of our continuing mission to provide you with exceptional heart care, our providers are all part of one team.  This team includes your primary Cardiologist (physician) and Advanced Practice Providers or APPs (Physician Assistants and Nurse Practitioners) who all work together to provide you with the care you need, when you need it.  Your next appointment:   6 months  Provider:   You may see one of the following Advanced Practice Providers on your designated Care Team:   Warren Ross, [place] Warren Needle "Mardelle Matte" [place], PA-C Warren Don, NP Warren Brim, NP  DEVICE CLINIC: [phone]      1st Floor: - Lobby - Registration  - Pharmacy  - Lab - Cafe  2nd Floor: - PV Lab - Diagnostic Testing (echo, CT, nuclear med)  3rd Floor: - Vacant  4th Floor: - TCTS (cardiothoracic surgery) - AFib Clinic - Structural Heart Clinic - Vascular Surgery  - Vascular Ultrasound  5th Floor: - HeartCare Cardiology (general and EP) - Clinical Pharmacy for coumadin, hypertension, lipid, weight-loss medications, and med management appointments    Valet parking services will be available as well.

## 2023-08-17 LAB — CUP PACEART INCLINIC DEVICE CHECK
Date Time Interrogation Session: 20250411000000
HighPow Impedance: 81 Ohm
Implantable Lead Connection Status: 753985
Implantable Lead Implant Date: 20241227
Implantable Lead Location: 753860
Implantable Lead Model: 673
Implantable Lead Serial Number: 251569
Implantable Pulse Generator Implant Date: 20241227
Lead Channel Impedance Value: 537 Ohm
Lead Channel Pacing Threshold Amplitude: 0.5 V
Lead Channel Pacing Threshold Pulse Width: 0.4 ms
Lead Channel Sensing Intrinsic Amplitude: 18.4 mV
Lead Channel Setting Pacing Amplitude: 2.5 V
Lead Channel Setting Pacing Pulse Width: 0.4 ms
Lead Channel Setting Sensing Sensitivity: 0.6 mV
Pulse Gen Serial Number: 341648
Zone Setting Status: 755011

## 2023-09-08 ENCOUNTER — Other Ambulatory Visit (HOSPITAL_COMMUNITY): Payer: Self-pay | Admitting: Cardiology

## 2023-09-16 NOTE — Progress Notes (Signed)
 Remote ICD transmission.

## 2023-09-23 ENCOUNTER — Telehealth (HOSPITAL_COMMUNITY): Payer: Self-pay | Admitting: Cardiology

## 2023-09-23 NOTE — Telephone Encounter (Signed)
 Called to confirm/remind patient of their appointment at the [facility] on 09/23/23 .   Appointment:   [x] Confirmed  [] Left mess   [] No answer/No voice mail  [] VM Full/unable to leave message  [] Phone not in service  Patient reminded to bring all medications and/or complete list.  Confirmed patient has transportation. Gave directions, instructed to utilize valet parking.

## 2023-09-24 ENCOUNTER — Ambulatory Visit (HOSPITAL_COMMUNITY)
Admission: RE | Admit: 2023-09-24 | Discharge: 2023-09-24 | Disposition: A | Discharge status: Home / Self Care | Source: Ambulatory Visit | Attending: Internal Medicine | Admitting: Internal Medicine

## 2023-09-24 ENCOUNTER — Encounter (HOSPITAL_COMMUNITY): Payer: Self-pay | Admitting: Cardiology

## 2023-09-24 ENCOUNTER — Ambulatory Visit (HOSPITAL_BASED_OUTPATIENT_CLINIC_OR_DEPARTMENT_OTHER)
Admission: RE | Admit: 2023-09-24 | Discharge: 2023-09-24 | Disposition: A | Discharge status: Home / Self Care | Source: Ambulatory Visit | Attending: Cardiology | Admitting: Cardiology

## 2023-09-24 ENCOUNTER — Ambulatory Visit (HOSPITAL_COMMUNITY): Payer: Self-pay | Admitting: Cardiology

## 2023-09-24 VITALS — BP 92/60 | HR 64 | Wt 201.8 lb

## 2023-09-24 DIAGNOSIS — Z7901 Long term (current) use of anticoagulants: Secondary | ICD-10-CM | POA: Diagnosis not present

## 2023-09-24 DIAGNOSIS — Z95810 Presence of automatic (implantable) cardiac defibrillator: Secondary | ICD-10-CM | POA: Insufficient documentation

## 2023-09-24 DIAGNOSIS — Z8674 Personal history of sudden cardiac arrest: Secondary | ICD-10-CM | POA: Diagnosis not present

## 2023-09-24 DIAGNOSIS — Z006 Encounter for examination for normal comparison and control in clinical research program: Secondary | ICD-10-CM

## 2023-09-24 DIAGNOSIS — Z87891 Personal history of nicotine dependence: Secondary | ICD-10-CM | POA: Insufficient documentation

## 2023-09-24 DIAGNOSIS — I480 Paroxysmal atrial fibrillation: Secondary | ICD-10-CM | POA: Diagnosis not present

## 2023-09-24 DIAGNOSIS — I5022 Chronic systolic (congestive) heart failure: Secondary | ICD-10-CM

## 2023-09-24 DIAGNOSIS — R0683 Snoring: Secondary | ICD-10-CM | POA: Insufficient documentation

## 2023-09-24 DIAGNOSIS — I110 Hypertensive heart disease with heart failure: Secondary | ICD-10-CM | POA: Insufficient documentation

## 2023-09-24 DIAGNOSIS — I428 Other cardiomyopathies: Secondary | ICD-10-CM | POA: Diagnosis not present

## 2023-09-24 DIAGNOSIS — E0590 Thyrotoxicosis, unspecified without thyrotoxic crisis or storm: Secondary | ICD-10-CM

## 2023-09-24 DIAGNOSIS — Z7984 Long term (current) use of oral hypoglycemic drugs: Secondary | ICD-10-CM | POA: Diagnosis not present

## 2023-09-24 DIAGNOSIS — Z79899 Other long term (current) drug therapy: Secondary | ICD-10-CM | POA: Diagnosis not present

## 2023-09-24 LAB — ECHOCARDIOGRAM COMPLETE
Area-P 1/2: 3.06 cm2
Calc EF: 46.7 %
S' Lateral: 3.8 cm
Single Plane A2C EF: 49.6 %
Single Plane A4C EF: 44.3 %

## 2023-09-24 LAB — COMPREHENSIVE METABOLIC PANEL WITH GFR
ALT: 32 U/L (ref 0–44)
AST: 22 U/L (ref 15–41)
Albumin: 4 g/dL (ref 3.5–5.0)
Alkaline Phosphatase: 65 U/L (ref 38–126)
Anion gap: 8 (ref 5–15)
BUN: 7 mg/dL (ref 6–20)
CO2: 29 mmol/L (ref 22–32)
Calcium: 9.7 mg/dL (ref 8.9–10.3)
Chloride: 100 mmol/L (ref 98–111)
Creatinine, Ser: 1.04 mg/dL (ref 0.61–1.24)
GFR, Estimated: >60 mL/min (ref >60)
Glucose, Bld: 89 mg/dL (ref 70–99)
Potassium: 4.7 mmol/L (ref 3.5–5.1)
Sodium: 137 mmol/L (ref 135–145)
Total Bilirubin: 0.2 mg/dL (ref 0.0–1.2)
Total Protein: 6.9 g/dL (ref 6.5–8.1)

## 2023-09-24 LAB — BRAIN NATRIURETIC PEPTIDE: B Natriuretic Peptide: 7.4 pg/mL (ref 0.0–100.0)

## 2023-09-24 LAB — TSH: TSH: 0.019 u[IU]/mL — ABNORMAL LOW (ref 0.350–4.500)

## 2023-09-24 NOTE — Progress Notes (Signed)
 Cardiomyopathy  genetic testing collected via blood per Dr Shirlee Latch.  Order form completed, signed and shipped with sample by Fed[employer] to [facility].

## 2023-09-24 NOTE — Research (Signed)
 SITE: [id number]     Subject # [id number]   Subprotocol: A  Inclusion Criteria  Patients who meet all of the following criteria are eligible for enrollment as study participants:  Yes No  Age > 40 years old X   Eligible to wear Holter Study X    Exclusion Criteria  Patients who meet any of these criteria are not eligible for enrollment as study participants: Yes No  1. Receiving any mechanical (respiratory or circulatory) or renal support therapy at Screening or during Visit #1.  X  2.  Any other conditions that in the opinion of the investigators are likely to prevent compliance with the study protocol or pose a safety concern if the subject participates in the study.  X  3. Poor tolerance, namely susceptible to severe skin allergies from ECG adhesive patch application.  X   Protocol: REV H    60 minute start window         Cor device must be applied, and the study initiated, no later than 60 minutes of completing the Echocardiogram                             HH:MM  Echo completion time  14:29  2.   Cor Study start time  14:46   30-Minute execution window  Once Cor Monitoring begins, 3 QT Med ECGs and the 15-minute rest period must be completed within a 30 minute window     HH:MM  3. QT Med ECG Completion time  14:50  4. Start of 15-Min sitting rest period  14:50  5. End of 15-Min rest period  15:05  6. Time of device removal  15:17   *Continue to use the Mobile App Event feature to log the Rest period windows and follow instructions on the EF-ACT Clinical Trial  Patient Instruction Card.  Describe any anomalies in Protocol execution in the Protocol Deviation Log    Residential Zip code [phone] (First 3 digits ONLY)                                           [facility] Informed Consent   Subject Name: Warren Ross  Subject met inclusion and exclusion criteria.  The informed consent form, study requirements and expectations were reviewed with the subject. Subject had opportunity to  read consent and questions and concerns were addressed prior to the signing of the consent form.  The subject verbalized understanding of the trial requirements.  The subject agreed to participate in the [facility] EF ACT trial and signed the informed consent at 14:37 on 24-Sep-2023.  The informed consent was obtained prior to performance of any protocol-specific procedures for the subject.  A copy of the signed informed consent was given to the subject and a copy was placed in the subject's medical record.   Warren Ross          Current Outpatient Medications:    amiodarone  (PACERONE ) 200 MG tablet, Take 0.5 tablets (100 mg total) by mouth daily., Disp: , Rfl:    ELIQUIS  5 MG TABS tablet, TAKE 1 TABLET BY MOUTH TWICE A DAY, Disp: 180 tablet, Rfl: 1   empagliflozin  (JARDIANCE ) 10 MG TABS tablet, Take 1 tablet (10 mg total) by mouth daily before breakfast., Disp: 30 tablet, Rfl: 11   metoprolol  succinate (TOPROL -XL) 100 MG  24 hr tablet, Take 1 tablet (100 mg total) by mouth daily., Disp: 30 tablet, Rfl: 5   sacubitril -valsartan  (ENTRESTO ) 97-103 MG, Take 1 tablet by mouth 2 (two) times daily., Disp: 60 tablet, Rfl: 11   spironolactone  (ALDACTONE ) 25 MG tablet, Take 1 tablet (25 mg total) by mouth daily., Disp: 90 tablet, Rfl: 3

## 2023-09-24 NOTE — Patient Instructions (Addendum)
 There has been no changes to your medications.  Labs done today, your results will be available in MyChart, we will contact you for abnormal readings.  Genetic testing has been collected, this has to be sent to [place]  for processing and can take 1-2 weeks for us  to get results back.  We will let you know the results once reviewed by your provider.  Your physician recommends that you schedule a follow-up appointment in: 4 months.  If you have any questions or concerns before your next appointment please send us  a message through [place] or call our office at [phone].    TO LEAVE A MESSAGE FOR THE NURSE SELECT OPTION 2, PLEASE LEAVE A MESSAGE INCLUDING: YOUR NAME DATE OF BIRTH CALL BACK NUMBER REASON FOR CALL**this is important as we prioritize the call backs  YOU WILL RECEIVE A CALL BACK THE SAME DAY AS LONG AS YOU CALL BEFORE 4:00 PM  At the [facility], you and your health needs are our priority. As part of our continuing mission to provide you with exceptional heart care, we have created designated Provider Care Teams. These Care Teams include your primary Cardiologist (physician) and Advanced Practice Providers (APPs- Physician Assistants and Nurse Practitioners) who all work together to provide you with the care you need, when you need it.   You may see any of the following providers on your designated Care Team at your next follow up: Dr Jules Oar Dr Peder Bourdon Dr. Alwin Baars Dr. Arta Lark Amy Marijane Shoulders, NP Ruddy Corral, [place] [facility] [place], [place] Dennise Fitz, NP [place] Lee, NP Shawnee Dellen, NP Luster Salters, PharmD Bevely Brush, PharmD   Please be sure to bring in all your medications bottles to every appointment.    Thank you for choosing [facility]

## 2023-09-25 NOTE — Telephone Encounter (Addendum)
 Pt aware, agreeable, and verbalized understanding  Med list updated and referral sent   ----- Message from Peder Bourdon sent at 09/24/2023  9:39 PM EDT ----- TSH is low suggestive of hyperthyroidism.  With ICD in place and improved EF, think he can stop amiodarone  at this point.  Would also recheck TSH along with free T3 and free T4.  Refer to endocrinology for hyperthyroidism.

## 2023-09-25 NOTE — Progress Notes (Signed)
 PCP: Anthon Kins, MD HF Cardiology: Dr. Mitzie Anda  Chief complaint: CHF  HPI: Mr Vanatta is a 38 y.o. with a history of biventricular heart failure, VF arrest, NICM, PAF, HTN, ETOH abuse (18-24 beers per day for many years), and tobacco abuse. Of note, his father had SCD at the age of 75.  He had an echo at age 31 after his father's cardiac arrest.  He says that he was told this showed an "athlete's heart."   Patient was admitted 04/28/23 after witnessed cardiac arrest.  Had CPR and was defibrillated x3.  EKG in ED showed atrial fibrillation with RVR. Echo showed EF 25-30% with RV mildly reduced. LHC showed no coronary disease. CMRI showed LV EF 28%, RVEF 37%, no LGE. EP consulted, [employer] ICD placed 05/01/23.  Started on amiodarone  400 mg twice a day. Discharged on GDMT.   Echo was done today and reviewed, EF up to 45-50%, normal diastolic function, normal RV, normal IVC.   Today he returns for HF follow up with his wife. He has been doing very well recently.  No ETOH or smoking.  No exertional dyspnea or chest pain.  He has stayed very active.  He and his wife have a new baby and he has been helping care for her.  Weight down 5 lbs.  No lightheadedness or palpitations.    ECG (personally reviewed): NSR, normal  Labs (12/24): LDL 71 Labs (1/25): K 4.4, creatinine 1.09 Labs (2/25): K 3.9, creatinine 1.06 Labs (12/24): LDL 71 Labs (3/25): K 4.4, creatinine 1.15, BNP 6.7, hgb 13.9   PMH: 1. Chronic systolic CHF: Nonischemic cardiomyopathy.  Familial versus ETOH (perhaps combination).  He was a heavy drinker prior to 12/24.  Father with SCD at 67. Has [employer] ICD.  - Echo (12/24): LV EF 25-30%, mild RV dysfunction.  - CMRI (12/24): LV EF 28%, RV EF 37%, no LGE. - Cath (12/24): Normal coronaries.  - Echo (5/25): EF up to 45-50%, normal diastolic function, normal RV, normal IVC.  2. Cardiac arrest: 12/24. Now has [employer] ICD and is on amiodarone .  3.  ETOH abuse: 18-24 beers/day until 12/24.  4. Smoker until 12/24 5. Atrial fibrillation: Paroxysmal.  6. HTN 7. H/o carpal tunnel syndrome.   Current Outpatient Medications  Medication Sig Dispense Refill   amiodarone  (PACERONE ) 200 MG tablet Take 0.5 tablets (100 mg total) by mouth daily.     ELIQUIS  5 MG TABS tablet TAKE 1 TABLET BY MOUTH TWICE A DAY 180 tablet 1   empagliflozin  (JARDIANCE ) 10 MG TABS tablet Take 1 tablet (10 mg total) by mouth daily before breakfast. 30 tablet 11   metoprolol  succinate (TOPROL -XL) 100 MG 24 hr tablet Take 1 tablet (100 mg total) by mouth daily. 30 tablet 5   sacubitril -valsartan  (ENTRESTO ) 97-103 MG Take 1 tablet by mouth 2 (two) times daily. 60 tablet 11   spironolactone  (ALDACTONE ) 25 MG tablet Take 1 tablet (25 mg total) by mouth daily. 90 tablet 3   No current facility-administered medications for this encounter.    No Known Allergies    Social History   Socioeconomic History   Marital status: Married    Spouse name: Richad Champagne   Number of children: 2   Years of education: Not on file   Highest education level: High school graduate  Occupational History   Occupation: Regulatory affairs officer [employer]  Tobacco Use   Smoking status: Every Day    Types: Cigarettes   Smokeless tobacco:  Former  Building services engineer status: Every Day   Substances: Nicotine , Flavoring  Substance and Sexual Activity   Alcohol use: Yes    Alcohol/week: 70.0 standard drinks of alcohol    Types: 70 Cans of beer per week    Comment: "close to 24 beers a day"   Drug use: Not Currently    Types: Marijuana    Comment: every once in a while   Sexual activity: Yes    Birth control/protection: None  Other Topics Concern   Not on file  Social History Narrative   Not on file   Social Drivers of Health   Financial Resource Strain: Low Risk  (04/30/2023)   Overall Financial Resource Strain (CARDIA)    Difficulty of Paying Living Expenses: Not very hard  Food Insecurity: No  Food Insecurity (05/04/2023)   Hunger Vital Sign    Worried About Running Out of Food in the Last Year: Never true    Ran Out of Food in the Last Year: Never true  Transportation Needs: No Transportation Needs (05/04/2023)   PRAPARE - Administrator, Civil Service (Medical): No    Lack of Transportation (Non-Medical): No  Physical Activity: Not on file  Stress: Not on file  Social Connections: Not on file  Intimate Partner Violence: Not At Risk (05/04/2023)   Humiliation, Afraid, Rape, and Kick questionnaire    Fear of Current or Ex-Partner: No    Emotionally Abused: No    Physically Abused: No    Sexually Abused: No    Family History  Problem Relation Age of Onset   COPD Mother    Early death Father    Diabetes Maternal Grandfather    ROS: All systems reviewed and negative except as per HPI.   BP 92/60   Pulse 64   Wt 91.5 kg (201 lb 12.8 oz)   SpO2 99%   BMI 26.62 kg/m   Wt Readings from Last 3 Encounters:  09/24/23 91.5 kg (201 lb 12.8 oz)  08/14/23 94.5 kg (208 lb 6.4 oz)  07/17/23 93.4 kg (206 lb)   PHYSICAL EXAM: General: NAD Neck: No JVD, no thyromegaly or thyroid  nodule.  Lungs: Clear to auscultation bilaterally with normal respiratory effort. CV: Nondisplaced PMI.  Heart regular S1/S2, no S3/S4, no murmur.  No peripheral edema.  No carotid bruit.  Normal pedal pulses.  Abdomen: Soft, nontender, no hepatosplenomegaly, no distention.  Skin: Intact without lesions or rashes.  Neurologic: Alert and oriented x 3.  Psych: Normal affect. Extremities: No clubbing or cyanosis.  HEENT: Normal.   ASSESSMENT & PLAN: 1. Chronic systolic CHF: Nonischemic cardiomyopathy, has [employer] ICD.  Due to ETOH abuse versus familial versus combination of the two => suspect combination as EF improved with abstinence from ETOH but not back to normal.  Heavy drinker prior to 12/24.  Father with SCD at age 33.  LHC 12/24 with no coronary disease.  Echo (12/24) with  LV EF 25-30%, mild RV dysfunction. Cardiac MRI (12/24) with LV EF 28%, RV EF 37%, no LGE. He has quit drinking completely.  Echo today showed improved EF up to 45-50%, normal diastolic function, normal RV, normal IVC. NYHA class I symptoms, not volume overloaded on exam.  No BP room for med titration today.  - Continue Jardiance  10 mg daily. BMET/BNP today.  - Continue Entresto  97/103 mg bid - Continue spironolactone  25 mg daily. - Continue Toprol  XL 100 mg daily.  - I think he should  have genetic testing for common familial cardiomyopathies => will send the [facility] familial cardiomyopathy screen.  We discussed the implications of genetic testing today.  2. VF: VF arrest 04/28/23. S/P [employer] ICD. - He will be able to drive again in June.  - He is on amiodarone  100 mg daily.  Check LFTs and TSH today, will need regular eye exam.  I think we can stop this soon, possibly next appointment as long as no more arrhythmias.  3. Atrial fibrillation: Paroxysmal.  NSR today.  - Continue low dose amiodarone , as above hope to stop soon. - Ablation if recurrence.  - Continue apixaban .  4.  ETOH: Heavy drinker until 12/24, possible contributor to cardiomyopathy. Has quit.  5. Smoking: Has quit smoking since 12/24.  - Congratulated. 6. Snoring: Suspect sleep apnea.  - Awaiting home sleep study.  Follow up in 4 months with APP  I spent 31 minutes reviewing records, interviewing/examining patient, and managing orders.    Peder Bourdon,  09/25/2023

## 2023-09-26 ENCOUNTER — Encounter: Payer: Self-pay | Admitting: Internal Medicine

## 2023-09-26 DIAGNOSIS — Z8674 Personal history of sudden cardiac arrest: Secondary | ICD-10-CM | POA: Insufficient documentation

## 2023-09-26 DIAGNOSIS — I5020 Unspecified systolic (congestive) heart failure: Secondary | ICD-10-CM | POA: Insufficient documentation

## 2023-10-26 ENCOUNTER — Encounter: Payer: Self-pay | Admitting: Internal Medicine

## 2023-11-02 ENCOUNTER — Ambulatory Visit (INDEPENDENT_AMBULATORY_CARE_PROVIDER_SITE_OTHER): Payer: BC Managed Care – PPO

## 2023-11-02 DIAGNOSIS — I469 Cardiac arrest, cause unspecified: Secondary | ICD-10-CM | POA: Diagnosis not present

## 2023-11-02 DIAGNOSIS — I5022 Chronic systolic (congestive) heart failure: Secondary | ICD-10-CM

## 2023-11-02 LAB — CUP PACEART REMOTE DEVICE CHECK
Battery Remaining Longevity: 180 mo
Battery Remaining Percentage: 100 %
Brady Statistic RV Percent Paced: 0 %
Date Time Interrogation Session: 20250630024400
HighPow Impedance: 83 Ohm
Implantable Lead Connection Status: 753985
Implantable Lead Implant Date: 20241227
Implantable Lead Location: 753860
Implantable Lead Model: 673
Implantable Lead Serial Number: 251569
Implantable Pulse Generator Implant Date: 20241227
Lead Channel Impedance Value: 504 Ohm
Lead Channel Pacing Threshold Amplitude: 0.6 V
Lead Channel Pacing Threshold Pulse Width: 0.4 ms
Lead Channel Setting Pacing Amplitude: 2.5 V
Lead Channel Setting Pacing Pulse Width: 0.4 ms
Lead Channel Setting Sensing Sensitivity: 0.6 mV
Pulse Gen Serial Number: 341648
Zone Setting Status: 755011

## 2023-11-04 ENCOUNTER — Ambulatory Visit: Payer: Self-pay | Admitting: Cardiology

## 2023-11-26 ENCOUNTER — Other Ambulatory Visit (HOSPITAL_COMMUNITY): Payer: Self-pay | Admitting: Cardiology

## 2023-12-03 NOTE — Progress Notes (Signed)
 Remote ICD transmission.

## 2024-01-27 NOTE — Progress Notes (Signed)
 [facility]   PCP: Jesus Bernardino MATSU, MD HF Cardiology: Dr. Rolan  HPI: Mr Spadaccini is a 38 y.o. with a history of biventricular heart failure, VF arrest, NICM, PAF, HTN, ETOH abuse (18-24 beers per day for many years), and tobacco abuse. Of note, his father had SCD at the age of 9.  He had an echo at age 53 after his father's cardiac arrest.  He says that he was told this showed an athlete's heart.   Patient was admitted 04/28/23 after witnessed cardiac arrest.  Had CPR and was defibrillated x3.  EKG in ED showed atrial fibrillation with RVR. Echo showed EF 25-30% with RV mildly reduced. LHC showed no coronary disease. CMRI showed LV EF 28%, RVEF 37%, no LGE. EP consulted, [employer] ICD placed 05/01/23.  Started on amiodarone  400 mg twice a day. Discharged on GDMT.   Echo 5/25 showed EF up to 45-50%, normal diastolic function, normal RV, normal IVC.   Today he returns for HF follow up. Overall feeling fine. Works in [employer], married in 4 children. No SOB with activity or work duties Remains free from ETOH and tobacco. Denies palpitations, abnormal bleeding, CP, dizziness, edema, or PND/Orthopnea. Appetite ok. Weight at home 202-206 pounds. Taking all medications.   Genetic testing was negative for common familial cardiomyopathies.  ECG (personally reviewed): NSR 88 bpm  Device interrogation (personally reviewed): thoracic impedence ok, average HR 80 bpm, 5 hr/day of activity, no VT  Labs (12/24): LDL 71 Labs (1/25): K 4.4, creatinine 1.09 Labs (2/25): K 3.9, creatinine 1.06 Labs (12/24): LDL 71 Labs (3/25): K 4.4, creatinine 1.15, BNP 6.7, hgb 13.9 Labs (5/25): LFTs normal, TSH 0.019   PMH: 1. Chronic systolic CHF: Nonischemic cardiomyopathy.  Familial versus ETOH (perhaps combination).  He was a heavy drinker prior to 12/24.  Father with SCD at 43. Has [employer] ICD.  - Echo (12/24): LV EF 25-30%, mild RV dysfunction.  - CMRI (12/24): LV EF  28%, RV EF 37%, no LGE. - Cath (12/24): Normal coronaries.  - Echo (5/25): EF up to 45-50%, normal diastolic function, normal RV, normal IVC.  - Prevention genetics negative for common familial cardiomyopathies 2. Cardiac arrest: 12/24. Now has [employer] ICD and is on amiodarone .  3. ETOH abuse: 18-24 beers/day until 12/24.  4. Smoker until 12/24 5. Atrial fibrillation: Paroxysmal.  6. HTN 7. H/o carpal tunnel syndrome.   Current Outpatient Medications  Medication Sig Dispense Refill   ELIQUIS  5 MG TABS tablet TAKE 1 TABLET BY MOUTH TWICE A DAY 180 tablet 1   empagliflozin  (JARDIANCE ) 10 MG TABS tablet Take 1 tablet (10 mg total) by mouth daily before breakfast. 30 tablet 11   metoprolol  succinate (TOPROL -XL) 100 MG 24 hr tablet TAKE 1 TABLET BY MOUTH EVERY DAY 90 tablet 1   sacubitril -valsartan  (ENTRESTO ) 97-103 MG Take 1 tablet by mouth 2 (two) times daily. 60 tablet 11   spironolactone  (ALDACTONE ) 25 MG tablet Take 1 tablet (25 mg total) by mouth daily. 90 tablet 3   No current facility-administered medications for this encounter.   No Known Allergies  Social History   Socioeconomic History   Marital status: Married    Spouse name: Donald   Number of children: 2   Years of education: Not on file   Highest education level: High school graduate  Occupational History   Occupation: Regulatory affairs officer [employer]  Tobacco Use   Smoking status: Former    Types: Cigarettes  Smokeless tobacco: Former  Building services engineer status: Former  Substance and Sexual Activity   Alcohol use: Not Currently   Drug use: Not Currently    Types: Marijuana    Comment: every once in a while   Sexual activity: Yes    Birth control/protection: None  Other Topics Concern   Not on file  Social History Narrative   Not on file   Social Drivers of Health   Financial Resource Strain: Low Risk  (04/30/2023)   Overall Financial Resource Strain (CARDIA)    Difficulty of Paying Living Expenses:  Not very hard  Food Insecurity: No Food Insecurity (05/04/2023)   Hunger Vital Sign    Worried About Running Out of Food in the Last Year: Never true    Ran Out of Food in the Last Year: Never true  Transportation Needs: No Transportation Needs (05/04/2023)   PRAPARE - Administrator, Civil Service (Medical): No    Lack of Transportation (Non-Medical): No  Physical Activity: Not on file  Stress: Not on file  Social Connections: Not on file  Intimate Partner Violence: Not At Risk (05/04/2023)   Humiliation, Afraid, Rape, and Kick questionnaire    Fear of Current or Ex-Partner: No    Emotionally Abused: No    Physically Abused: No    Sexually Abused: No   Family History  Problem Relation Age of Onset   COPD Mother    Early death Father    Diabetes Maternal Grandfather    ROS: All systems reviewed and negative except as per HPI.   BP 100/70   Pulse 90   Ht 6' 1 (1.854 m)   Wt 91.5 kg (201 lb 12.8 oz)   SpO2 99%   BMI 26.62 kg/m   Wt Readings from Last 3 Encounters:  01/29/24 91.5 kg (201 lb 12.8 oz)  09/24/23 91.5 kg (201 lb 12.8 oz)  08/14/23 94.5 kg (208 lb 6.4 oz)   PHYSICAL EXAM: General:  NAD. No resp difficulty, walked into clinic HEENT: Normal Neck: Supple. No JVD. Cor: Regular rate & rhythm. No rubs, gallops or murmurs. Lungs: Clear Abdomen: Soft, nontender, nondistended.  Extremities: No cyanosis, clubbing, rash, edema Neuro: Alert & oriented x 3, moves all 4 extremities w/o difficulty. Affect pleasant.  ASSESSMENT & PLAN: 1. Chronic systolic CHF: Nonischemic cardiomyopathy, has [employer] ICD.  Due to ETOH abuse versus familial versus combination of the two => suspect combination as EF improved with abstinence from ETOH but not back to normal.  Heavy drinker prior to 12/24.  Father with SCD at age 75.  LHC 12/24 with no coronary disease.  Echo (12/24) with LV EF 25-30%, mild RV dysfunction. Cardiac MRI (12/24) with LV EF 28%, RV EF 37%, no  LGE. He has quit drinking completely.  Echo 5/25 showed improved EF up to 45-50%, normal diastolic function, normal RV, normal IVC. NYHA class I symptoms, not volume overloaded on exam.  No BP room for med titration today.  - Continue Jardiance  10 mg daily. BMET today - Continue Entresto  97/103 mg bid - Continue spironolactone  25 mg daily. - Continue Toprol  XL 100 mg daily.  - Genetic testing was negative for common genetic cardiomyopathies. 2. VF: VF arrest 04/28/23. S/P [employer] ICD. - Now off amiodarone . Check TFTs today, recent thyroid  studies suggested hyperthyroidism. 3. Atrial fibrillation: Paroxysmal.  NSR on ECG today.  - Ablation if recurrence.  - Continue apixaban  5 mg bid. No bleeding issues. CBC today.  4.  ETOH: Heavy drinker until 12/24, possible contributor to cardiomyopathy. Has quit.  - Congratulated. 5. Smoking: Has quit smoking since 12/24.  - Congratulated. 6. Snoring: Suspect sleep apnea.  - He has home sleep study, I asked him to complete this.  Follow up in 4 months with APP   Harlene CHRISTELLA Gainer, FNP-BC 01/29/2024

## 2024-01-28 ENCOUNTER — Telehealth (HOSPITAL_COMMUNITY): Payer: Self-pay

## 2024-01-28 NOTE — Telephone Encounter (Signed)
 Called to confirm/remind patient of their appointment at the [facility] on 01/29/24.   Appointment:   [x] Confirmed  [] Left mess   [] No answer/No voice mail  [] VM Full/unable to leave message  [] Phone not in service  Patient reminded to bring all medications and/or complete list.  Confirmed patient has transportation. Gave directions, instructed to utilize valet parking.

## 2024-01-29 ENCOUNTER — Ambulatory Visit (HOSPITAL_COMMUNITY)
Admission: RE | Admit: 2024-01-29 | Discharge: 2024-01-29 | Disposition: A | Discharge status: Home / Self Care | Source: Ambulatory Visit | Attending: Family Medicine | Admitting: Family Medicine

## 2024-01-29 ENCOUNTER — Encounter (HOSPITAL_COMMUNITY): Payer: Self-pay

## 2024-01-29 ENCOUNTER — Ambulatory Visit (HOSPITAL_COMMUNITY): Payer: Self-pay | Admitting: Family Medicine

## 2024-01-29 ENCOUNTER — Telehealth (HOSPITAL_COMMUNITY): Payer: Self-pay

## 2024-01-29 VITALS — BP 100/70 | HR 90 | Ht 73.0 in | Wt 201.8 lb

## 2024-01-29 DIAGNOSIS — Z87891 Personal history of nicotine dependence: Secondary | ICD-10-CM | POA: Insufficient documentation

## 2024-01-29 DIAGNOSIS — Z7984 Long term (current) use of oral hypoglycemic drugs: Secondary | ICD-10-CM | POA: Diagnosis not present

## 2024-01-29 DIAGNOSIS — Z720 Tobacco use: Secondary | ICD-10-CM

## 2024-01-29 DIAGNOSIS — F1091 Alcohol use, unspecified, in remission: Secondary | ICD-10-CM | POA: Insufficient documentation

## 2024-01-29 DIAGNOSIS — I110 Hypertensive heart disease with heart failure: Secondary | ICD-10-CM | POA: Insufficient documentation

## 2024-01-29 DIAGNOSIS — Z79899 Other long term (current) drug therapy: Secondary | ICD-10-CM | POA: Diagnosis not present

## 2024-01-29 DIAGNOSIS — I5022 Chronic systolic (congestive) heart failure: Secondary | ICD-10-CM | POA: Insufficient documentation

## 2024-01-29 DIAGNOSIS — R0683 Snoring: Secondary | ICD-10-CM | POA: Insufficient documentation

## 2024-01-29 DIAGNOSIS — F1090 Alcohol use, unspecified, uncomplicated: Secondary | ICD-10-CM

## 2024-01-29 DIAGNOSIS — Z7901 Long term (current) use of anticoagulants: Secondary | ICD-10-CM | POA: Insufficient documentation

## 2024-01-29 DIAGNOSIS — Z95810 Presence of automatic (implantable) cardiac defibrillator: Secondary | ICD-10-CM | POA: Insufficient documentation

## 2024-01-29 DIAGNOSIS — I428 Other cardiomyopathies: Secondary | ICD-10-CM | POA: Diagnosis not present

## 2024-01-29 DIAGNOSIS — I5082 Biventricular heart failure: Secondary | ICD-10-CM | POA: Diagnosis not present

## 2024-01-29 DIAGNOSIS — I480 Paroxysmal atrial fibrillation: Secondary | ICD-10-CM | POA: Diagnosis not present

## 2024-01-29 DIAGNOSIS — I4901 Ventricular fibrillation: Secondary | ICD-10-CM

## 2024-01-29 DIAGNOSIS — Z8674 Personal history of sudden cardiac arrest: Secondary | ICD-10-CM | POA: Diagnosis not present

## 2024-01-29 LAB — BASIC METABOLIC PANEL WITH GFR
Anion gap: 10 (ref 5–15)
BUN: 8 mg/dL (ref 6–20)
CO2: 25 mmol/L (ref 22–32)
Calcium: 8.9 mg/dL (ref 8.9–10.3)
Chloride: 101 mmol/L (ref 98–111)
Creatinine, Ser: 0.96 mg/dL (ref 0.61–1.24)
GFR, Estimated: >60 mL/min (ref >60)
Glucose, Bld: 90 mg/dL (ref 70–99)
Potassium: 4 mmol/L (ref 3.5–5.1)
Sodium: 136 mmol/L (ref 135–145)

## 2024-01-29 LAB — CBC
HCT: 41.9 % (ref 39.0–52.0)
Hemoglobin: 14.1 g/dL (ref 13.0–17.0)
MCH: 30 pg (ref 26.0–34.0)
MCHC: 33.7 g/dL (ref 30.0–36.0)
MCV: 89.1 fL (ref 80.0–100.0)
Platelets: 267 K/uL (ref 150–400)
RBC: 4.7 MIL/uL (ref 4.22–5.81)
RDW: 12.5 % (ref 11.5–15.5)
WBC: 6.7 K/uL (ref 4.0–10.5)
nRBC: 0 % (ref 0.0–0.2)

## 2024-01-29 LAB — T4, FREE: Free T4: 0.84 ng/dL (ref 0.61–1.12)

## 2024-01-29 LAB — TSH: TSH: 3.414 u[IU]/mL (ref 0.350–4.500)

## 2024-01-29 NOTE — Patient Instructions (Signed)
 There has been no changes to your medications.  Labs done today, your results will be available in MyChart, we will contact you for abnormal readings.  Your physician recommends that you schedule a follow-up appointment in: 6 months ( March 2026) ** PLEASE CALL THE OFFICE IN [place] TO ARRANGE YOUR FOLLOW UP APPOINTMENT.**  If you have any questions or concerns before your next appointment please send us  a message through [place] or call our office at [phone].    TO LEAVE A MESSAGE FOR THE NURSE SELECT OPTION 2, PLEASE LEAVE A MESSAGE INCLUDING: YOUR NAME DATE OF BIRTH CALL BACK NUMBER REASON FOR CALL**this is important as we prioritize the call backs  YOU WILL RECEIVE A CALL BACK THE SAME DAY AS LONG AS YOU CALL BEFORE 4:00 PM  At the [facility], you and your health needs are our priority. As part of our continuing mission to provide you with exceptional heart care, we have created designated Provider Care Teams. These Care Teams include your primary Cardiologist (physician) and Advanced Practice Providers (APPs- Physician Assistants and Nurse Practitioners) who all work together to provide you with the care you need, when you need it.   You may see any of the following providers on your designated Care Team at your next follow up: Dr Toribio Fuel Dr Ezra Shuck Dr. Ria Commander Dr. Morene Brownie Amy Lenetta, NP Caffie Shed, [place] [facility] [place], [place] Beckey Coe, NP [place] Lee, NP Ellouise Class, NP Tinnie Redman, PharmD Jaun Bash, PharmD   Please be sure to bring in all your medications bottles to every appointment.    Thank you for choosing [facility]

## 2024-01-30 LAB — T3, FREE: T3, Free: 3.3 pg/mL (ref 2.0–4.4)

## 2024-02-01 ENCOUNTER — Ambulatory Visit (INDEPENDENT_AMBULATORY_CARE_PROVIDER_SITE_OTHER): Payer: BC Managed Care – PPO

## 2024-02-01 DIAGNOSIS — I5022 Chronic systolic (congestive) heart failure: Secondary | ICD-10-CM | POA: Diagnosis not present

## 2024-02-01 LAB — CUP PACEART REMOTE DEVICE CHECK
Battery Remaining Longevity: 156 mo
Battery Remaining Percentage: 100 %
Brady Statistic RV Percent Paced: 0 %
Date Time Interrogation Session: 20250929041500
HighPow Impedance: 77 Ohm
Implantable Lead Connection Status: 753985
Implantable Lead Implant Date: 20241227
Implantable Lead Location: 753860
Implantable Lead Model: 673
Implantable Lead Serial Number: 251569
Implantable Pulse Generator Implant Date: 20241227
Lead Channel Impedance Value: 480 Ohm
Lead Channel Pacing Threshold Amplitude: 0.6 V
Lead Channel Pacing Threshold Pulse Width: 0.4 ms
Lead Channel Setting Pacing Amplitude: 2.5 V
Lead Channel Setting Pacing Pulse Width: 0.4 ms
Lead Channel Setting Sensing Sensitivity: 0.6 mV
Pulse Gen Serial Number: 341648
Zone Setting Status: 755011

## 2024-02-03 NOTE — Progress Notes (Signed)
 Remote ICD Transmission

## 2024-02-07 ENCOUNTER — Ambulatory Visit: Payer: Self-pay | Admitting: Cardiology

## 2024-02-08 ENCOUNTER — Encounter: Payer: Self-pay | Admitting: Family Medicine

## 2024-03-26 ENCOUNTER — Other Ambulatory Visit (HOSPITAL_COMMUNITY): Payer: Self-pay | Admitting: Cardiology

## 2024-05-02 ENCOUNTER — Ambulatory Visit: Payer: BC Managed Care – PPO

## 2024-05-02 DIAGNOSIS — I5022 Chronic systolic (congestive) heart failure: Secondary | ICD-10-CM | POA: Diagnosis not present

## 2024-05-03 LAB — CUP PACEART REMOTE DEVICE CHECK
Battery Remaining Longevity: 174 mo
Battery Remaining Percentage: 100 %
Brady Statistic RV Percent Paced: 0 %
Date Time Interrogation Session: 20251229023100
HighPow Impedance: 74 Ohm
Implantable Lead Connection Status: 753985
Implantable Lead Implant Date: 20241227
Implantable Lead Location: 753860
Implantable Lead Model: 673
Implantable Lead Serial Number: 251569
Implantable Pulse Generator Implant Date: 20241227
Lead Channel Impedance Value: 523 Ohm
Lead Channel Pacing Threshold Amplitude: 0.5 V
Lead Channel Pacing Threshold Pulse Width: 0.4 ms
Lead Channel Setting Pacing Amplitude: 2.5 V
Lead Channel Setting Pacing Pulse Width: 0.4 ms
Lead Channel Setting Sensing Sensitivity: 0.6 mV
Pulse Gen Serial Number: 341648
Zone Setting Status: 755011

## 2024-05-06 ENCOUNTER — Other Ambulatory Visit (HOSPITAL_COMMUNITY): Payer: Self-pay | Admitting: Adult Health

## 2024-05-06 DIAGNOSIS — I5023 Acute on chronic systolic (congestive) heart failure: Secondary | ICD-10-CM

## 2024-05-06 DIAGNOSIS — I428 Other cardiomyopathies: Secondary | ICD-10-CM

## 2024-05-08 ENCOUNTER — Ambulatory Visit: Payer: Self-pay | Admitting: Cardiology

## 2024-05-10 NOTE — Progress Notes (Signed)
 Remote ICD Transmission

## 2024-05-22 ENCOUNTER — Other Ambulatory Visit (HOSPITAL_COMMUNITY): Payer: Self-pay | Admitting: Cardiology

## 2024-05-28 ENCOUNTER — Other Ambulatory Visit (HOSPITAL_COMMUNITY): Payer: Self-pay | Admitting: Cardiology
